# Patient Record
Sex: Male | Born: 1940 | Race: Black or African American | Hispanic: No | Marital: Single | Smoking: Former smoker
Health system: Southern US, Community
[De-identification: ages and names within clinical notes are randomized; demographics above are authoritative.]

## PROBLEM LIST (undated history)

## (undated) DIAGNOSIS — E785 Hyperlipidemia, unspecified: Secondary | ICD-10-CM

## (undated) DIAGNOSIS — G459 Transient cerebral ischemic attack, unspecified: Secondary | ICD-10-CM

## (undated) DIAGNOSIS — F0390 Unspecified dementia without behavioral disturbance: Secondary | ICD-10-CM

---

## 2008-12-05 ENCOUNTER — Ambulatory Visit: Payer: Self-pay | Admitting: Diagnostic Radiology

## 2008-12-05 ENCOUNTER — Emergency Department (HOSPITAL_BASED_OUTPATIENT_CLINIC_OR_DEPARTMENT_OTHER): Admission: EM | Admit: 2008-12-05 | Discharge: 2008-12-06 | Payer: Self-pay | Admitting: Emergency Medicine

## 2010-08-05 LAB — URINE CULTURE
Colony Count: NO GROWTH
Culture: NO GROWTH

## 2010-08-05 LAB — POCT CARDIAC MARKERS
CKMB, poc: <1 ng/mL — ABNORMAL LOW (ref 1.0–8.0)
Troponin i, poc: <0.05 ng/mL (ref 0.00–0.09)

## 2010-08-05 LAB — COMPREHENSIVE METABOLIC PANEL WITH GFR
ALT: 6 U/L (ref 0–53)
AST: 23 U/L (ref 0–37)
Albumin: 4.3 g/dL (ref 3.5–5.2)
Alkaline Phosphatase: 112 U/L (ref 39–117)
BUN: 19 mg/dL (ref 6–23)
CO2: 32 meq/L (ref 19–32)
Calcium: 10.1 mg/dL (ref 8.4–10.5)
Chloride: 100 meq/L (ref 96–112)
Creatinine, Ser: 1.3 mg/dL (ref 0.4–1.5)
GFR calc non Af Amer: 55 mL/min — ABNORMAL LOW
Glucose, Bld: 113 mg/dL — ABNORMAL HIGH (ref 70–99)
Potassium: 3.2 meq/L — ABNORMAL LOW (ref 3.5–5.1)
Sodium: 143 meq/L (ref 135–145)
Total Bilirubin: 0.3 mg/dL (ref 0.3–1.2)
Total Protein: 8.2 g/dL (ref 6.0–8.3)

## 2010-08-05 LAB — DIFFERENTIAL
Basophils Absolute: 0.2 K/uL — ABNORMAL HIGH (ref 0.0–0.1)
Basophils Relative: 2 % — ABNORMAL HIGH (ref 0–1)
Eosinophils Absolute: 0 K/uL (ref 0.0–0.7)
Eosinophils Relative: 1 % (ref 0–5)
Lymphocytes Relative: 21 % (ref 12–46)
Lymphs Abs: 1.7 K/uL (ref 0.7–4.0)
Monocytes Absolute: 0.6 K/uL (ref 0.1–1.0)
Monocytes Relative: 8 % (ref 3–12)
Neutro Abs: 5.7 K/uL (ref 1.7–7.7)
Neutrophils Relative %: 69 % (ref 43–77)

## 2010-08-05 LAB — URINALYSIS, ROUTINE W REFLEX MICROSCOPIC
Bilirubin Urine: NEGATIVE
Glucose, UA: NEGATIVE mg/dL
Hgb urine dipstick: NEGATIVE
Ketones, ur: NEGATIVE mg/dL
Nitrite: NEGATIVE
Protein, ur: NEGATIVE mg/dL
Specific Gravity, Urine: 1.015 (ref 1.005–1.030)
Urobilinogen, UA: 1 mg/dL (ref 0.0–1.0)
pH: 7.5 (ref 5.0–8.0)

## 2010-08-05 LAB — URINE MICROSCOPIC-ADD ON

## 2010-08-05 LAB — POCT TOXICOLOGY PANEL

## 2010-08-05 LAB — APTT: aPTT: 35 s (ref 24–37)

## 2010-08-05 LAB — CBC
HCT: 40.2 % (ref 39.0–52.0)
Platelets: 211 10*3/uL (ref 150–400)
WBC: 8.2 10*3/uL (ref 4.0–10.5)

## 2019-01-28 DIAGNOSIS — I10 Essential (primary) hypertension: Secondary | ICD-10-CM | POA: Insufficient documentation

## 2019-01-28 DIAGNOSIS — Z8673 Personal history of transient ischemic attack (TIA), and cerebral infarction without residual deficits: Secondary | ICD-10-CM | POA: Insufficient documentation

## 2019-01-28 DIAGNOSIS — W19XXXA Unspecified fall, initial encounter: Secondary | ICD-10-CM | POA: Insufficient documentation

## 2019-01-28 DIAGNOSIS — R29898 Other symptoms and signs involving the musculoskeletal system: Secondary | ICD-10-CM | POA: Insufficient documentation

## 2019-01-28 DIAGNOSIS — Z9114 Patient's other noncompliance with medication regimen: Secondary | ICD-10-CM | POA: Insufficient documentation

## 2019-03-28 DIAGNOSIS — G459 Transient cerebral ischemic attack, unspecified: Secondary | ICD-10-CM | POA: Insufficient documentation

## 2019-04-01 DIAGNOSIS — I639 Cerebral infarction, unspecified: Secondary | ICD-10-CM | POA: Insufficient documentation

## 2019-04-01 DIAGNOSIS — R262 Difficulty in walking, not elsewhere classified: Secondary | ICD-10-CM | POA: Insufficient documentation

## 2019-07-20 ENCOUNTER — Emergency Department (HOSPITAL_COMMUNITY): Payer: Medicare HMO

## 2019-07-20 ENCOUNTER — Encounter (HOSPITAL_COMMUNITY): Payer: Self-pay

## 2019-07-20 ENCOUNTER — Emergency Department (HOSPITAL_COMMUNITY)
Admission: EM | Admit: 2019-07-20 | Discharge: 2019-07-20 | Disposition: A | Discharge status: Home / Self Care | Payer: Medicare HMO | Attending: Emergency Medicine | Admitting: Emergency Medicine

## 2019-07-20 DIAGNOSIS — Y92129 Unspecified place in nursing home as the place of occurrence of the external cause: Secondary | ICD-10-CM | POA: Insufficient documentation

## 2019-07-20 DIAGNOSIS — Z7901 Long term (current) use of anticoagulants: Secondary | ICD-10-CM | POA: Diagnosis not present

## 2019-07-20 DIAGNOSIS — Y999 Unspecified external cause status: Secondary | ICD-10-CM | POA: Diagnosis not present

## 2019-07-20 DIAGNOSIS — R030 Elevated blood-pressure reading, without diagnosis of hypertension: Secondary | ICD-10-CM | POA: Insufficient documentation

## 2019-07-20 DIAGNOSIS — W06XXXA Fall from bed, initial encounter: Secondary | ICD-10-CM | POA: Diagnosis not present

## 2019-07-20 DIAGNOSIS — Z23 Encounter for immunization: Secondary | ICD-10-CM | POA: Insufficient documentation

## 2019-07-20 DIAGNOSIS — Y939 Activity, unspecified: Secondary | ICD-10-CM | POA: Insufficient documentation

## 2019-07-20 DIAGNOSIS — S0181XA Laceration without foreign body of other part of head, initial encounter: Secondary | ICD-10-CM | POA: Insufficient documentation

## 2019-07-20 DIAGNOSIS — F0390 Unspecified dementia without behavioral disturbance: Secondary | ICD-10-CM | POA: Insufficient documentation

## 2019-07-20 DIAGNOSIS — I10 Essential (primary) hypertension: Secondary | ICD-10-CM

## 2019-07-20 HISTORY — DX: Unspecified dementia, unspecified severity, without behavioral disturbance, psychotic disturbance, mood disturbance, and anxiety: F03.90

## 2019-07-20 HISTORY — DX: Hyperlipidemia, unspecified: E78.5

## 2019-07-20 HISTORY — DX: Transient cerebral ischemic attack, unspecified: G45.9

## 2019-07-20 MED ORDER — TETANUS-DIPHTH-ACELL PERTUSSIS 5-2.5-18.5 LF-MCG/0.5 IM SUSP
0.5000 mL | Freq: Once | INTRAMUSCULAR | Status: AC
  Administered 2019-07-20: 0.5 mL via INTRAMUSCULAR
  Filled 2019-07-20: qty 0.5

## 2019-07-20 NOTE — ED Triage Notes (Signed)
Pt comes via [facility] form [facility], got up to use the bathroom had a mechanical fall, hit head, no LOC, lac to center forehead, on Plavix

## 2019-07-20 NOTE — ED Provider Notes (Signed)
[facility] EMERGENCY DEPARTMENT Provider Note   CSN: [id number] Arrival date & time: 07/20/19  0111   History Chief Complaint  Patient presents with  . Fall    Jeff Wilson is a 79 y.o. male.  The history is provided by the patient.  Fall  He has history of hyperlipidemia, transient ischemic attack, dementia and is brought in by ambulance as a level 2 trauma because he fell out of bed.  He did strike his forehead suffering a laceration.  There was no known loss of consciousness.  He denies other injury.  It is not on his last tetanus immunization was.  He is on clopidogrel, but no systemic anticoagulants.  Past Medical History:  Diagnosis Date  . Dementia (HCC)   . Hyperlipemia   . TIA (transient ischemic attack)     There are no problems to display for this patient.   History reviewed. No pertinent surgical history.     No family history on file.  Social History   Tobacco Use  . Smoking status: Not on file  Substance Use Topics  . Alcohol use: Not on file  . Drug use: Not on file    Home Medications Prior to Admission medications   Not on File    Allergies    Patient has no known allergies.  Review of Systems   Review of Systems  All other systems reviewed and are negative.   Physical Exam Updated Vital Signs BP (!) 151/82   Pulse (!) 106   Temp 98.1 F (36.7 C)   Resp 13   Ht 5\' 9"  (1.753 m)   Wt 72.6 kg   SpO2 99%   BMI 23.63 kg/m   Physical Exam Vitals and nursing note reviewed.   79 year old male, resting comfortably and in no acute distress. Vital signs are significant for mildly elevated blood pressure and mildly elevated heart rate. Oxygen saturation is 99%, which is normal. Head is normocephalic.  Stellate laceration is present on the forehead. PERRLA, EOMI. Oropharynx is clear. Neck is nontender without adenopathy or JVD. Back is nontender and there is no CVA tenderness. Lungs are clear without rales, wheezes,  or rhonchi. Chest is nontender. Heart has regular rate and rhythm without murmur. Abdomen is soft, flat, nontender without masses or hepatosplenomegaly and peristalsis is normoactive. Extremities have no cyanosis or edema, full range of motion is present. Skin is warm and dry without rash. Neurologic: Awake and alert and oriented to person and place but not time, cranial nerves are intact, there are no motor or sensory deficits.  ED Results / Procedures / Treatments    EKG EKG Interpretation  Date/Time:  Tuesday July 20 2019 01:15:40 EDT Ventricular Rate:  76 PR Interval:    QRS Duration: 100 QT Interval:  396 QTC Calculation: 446 R Axis:   6 Text Interpretation: Sinus rhythm Ventricular premature complex Anteroseptal infarct, old When compared with ECG of 12/05/2008, Premature ventricular complexes are now present Confirmed by 02/04/2009 (Dione Booze) on 07/20/2019 1:24:32 AM   Radiology CT Head Wo Contrast  Result Date: 07/20/2019 CLINICAL DATA:  Fall, hit head EXAM: CT HEAD WITHOUT CONTRAST TECHNIQUE: Contiguous axial images were obtained from the base of the skull through the vertex without intravenous contrast. 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 Cerebral atrophy. 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 No acute intracranial abnormality. Specifically, no hemorrhage, hydrocephalus, mass lesion, acute infarction, or significant  intracranial injury. Vascular: No hyperdense vessel or unexpected calcification. Skull: No acute calvarial abnormality. Sinuses/Orbits: Visualized paranasal sinuses and mastoids clear. Orbital soft tissues unremarkable. Other: Soft tissue swelling and gas in the forehead soft tissues. IMPRESSION: Atrophy, chronic microvascular disease. No acute intracranial abnormality. 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 Electronically Signed   By: Rolm Baptise M.D.    On: 07/20/2019 01:47   CT Cervical Spine Wo Contrast  Result Date: 07/20/2019 CLINICAL DATA:  Fall, hit head. EXAM: CT CERVICAL SPINE WITHOUT CONTRAST TECHNIQUE: Multidetector CT imaging of the cervical spine was performed without intravenous contrast. Multiplanar CT image reconstructions were also generated. COMPARISON:  None. FINDINGS: Alignment: No subluxation Skull base and vertebrae: No acute fracture. No primary bone lesion or focal pathologic process. Soft tissues and spinal canal: No prevertebral fluid or swelling. No visible canal hematoma. Disc levels: Diffuse degenerative disc and facet disease. Multilevel bilateral neural foraminal narrowing. Upper chest: No acute findings Other: Carotid artery calcifications IMPRESSION: Degenerative disc and facet disease.  No acute bony abnormality. Electronically Signed   By: Rolm Baptise M.D.   On: 07/20/2019 01:49    Procedures .Marland KitchenLaceration Repair  Date/Time: 07/20/2019 1:29 AM Performed by: Delora Fuel, MD Authorized by: Delora Fuel, MD   Consent:    Consent obtained:  Verbal   Consent given by:  Patient   Risks discussed:  Infection and poor cosmetic result   Alternatives discussed:  No treatment Anesthesia (see MAR for exact dosages):    Anesthesia method:  None Laceration details:    Location:  Face   Face location:  Forehead   Length (cm):  3   Depth (mm):  1 Repair type:    Repair type:  Simple Pre-procedure details:    Preparation:  Patient was prepped and draped in usual sterile fashion and imaging obtained to evaluate for foreign bodies Exploration:    Hemostasis achieved with:  Direct pressure   Wound exploration: entire depth of wound probed and visualized     Wound extent: no foreign bodies/material noted     Contaminated: no   Treatment:    Area cleansed with:  Saline   Amount of cleaning:  Standard Skin repair:    Repair method:  Tissue adhesive Approximation:    Approximation:  Close Post-procedure details:      Dressing:  Open (no dressing)     Medications Ordered in ED Medications  Tdap (BOOSTRIX) injection 0.5 mL (0.5 mLs Intramuscular Given 07/20/19 0123)    ED Course  I have reviewed the triage vital signs and the nursing notes.  Pertinent imaging results that were available during my care of the patient were reviewed by me and considered in my medical decision making (see chart for details).  MDM Rules/Calculators/A&P Fall from bed with forehead laceration.  Laceration is closed with tissue adhesive.  He is sent for CT of head and cervical spine.  Old records are reviewed, and it is noted that he had been evaluated at [facility] because of ambulatory dysfunction.  Tdap booster is given.  CT scans show no acute injury.  He is discharged home.  Final Clinical Impression(s) / ED Diagnoses Final diagnoses:  Fall from bed, initial encounter  Forehead laceration, initial encounter  Elevated blood pressure reading with diagnosis of hypertension    Rx / DC Orders ED Discharge Orders    None       Delora Fuel, MD 62/69/48 [phone]

## 2019-07-20 NOTE — ED Notes (Signed)
[facility] called to transport pt 

## 2020-05-22 ENCOUNTER — Emergency Department (HOSPITAL_BASED_OUTPATIENT_CLINIC_OR_DEPARTMENT_OTHER): Payer: Medicare HMO

## 2020-05-22 ENCOUNTER — Other Ambulatory Visit: Payer: Self-pay

## 2020-05-22 ENCOUNTER — Emergency Department (HOSPITAL_BASED_OUTPATIENT_CLINIC_OR_DEPARTMENT_OTHER)
Admission: EM | Admit: 2020-05-22 | Discharge: 2020-05-22 | Disposition: A | Discharge status: Home / Self Care | Payer: Medicare HMO | Attending: Emergency Medicine | Admitting: Emergency Medicine

## 2020-05-22 ENCOUNTER — Encounter (HOSPITAL_BASED_OUTPATIENT_CLINIC_OR_DEPARTMENT_OTHER): Payer: Self-pay | Admitting: Emergency Medicine

## 2020-05-22 DIAGNOSIS — Z8673 Personal history of transient ischemic attack (TIA), and cerebral infarction without residual deficits: Secondary | ICD-10-CM | POA: Insufficient documentation

## 2020-05-22 DIAGNOSIS — Z87891 Personal history of nicotine dependence: Secondary | ICD-10-CM | POA: Insufficient documentation

## 2020-05-22 DIAGNOSIS — Z7902 Long term (current) use of antithrombotics/antiplatelets: Secondary | ICD-10-CM | POA: Insufficient documentation

## 2020-05-22 DIAGNOSIS — F0390 Unspecified dementia without behavioral disturbance: Secondary | ICD-10-CM | POA: Insufficient documentation

## 2020-05-22 DIAGNOSIS — Z20822 Contact with and (suspected) exposure to covid-19: Secondary | ICD-10-CM | POA: Insufficient documentation

## 2020-05-22 DIAGNOSIS — R4182 Altered mental status, unspecified: Secondary | ICD-10-CM | POA: Diagnosis present

## 2020-05-22 LAB — COMPREHENSIVE METABOLIC PANEL
ALT: 23 U/L (ref 0–44)
AST: 28 U/L (ref 15–41)
Albumin: 3.5 g/dL (ref 3.5–5.0)
Alkaline Phosphatase: 89 U/L (ref 38–126)
Anion gap: 9 (ref 5–15)
BUN: 17 mg/dL (ref 8–23)
CO2: 28 mmol/L (ref 22–32)
Calcium: 9.3 mg/dL (ref 8.9–10.3)
Chloride: 103 mmol/L (ref 98–111)
Creatinine, Ser: 0.95 mg/dL (ref 0.61–1.24)
GFR, Estimated: >60 mL/min (ref >60)
Glucose, Bld: 113 mg/dL — ABNORMAL HIGH (ref 70–99)
Potassium: 3.5 mmol/L (ref 3.5–5.1)
Sodium: 140 mmol/L (ref 135–145)
Total Bilirubin: 0.6 mg/dL (ref 0.3–1.2)
Total Protein: 7.2 g/dL (ref 6.5–8.1)

## 2020-05-22 LAB — URINALYSIS, ROUTINE W REFLEX MICROSCOPIC
Bilirubin Urine: NEGATIVE
Glucose, UA: NEGATIVE mg/dL
Hgb urine dipstick: NEGATIVE
Ketones, ur: NEGATIVE mg/dL
Leukocytes,Ua: NEGATIVE
Nitrite: NEGATIVE
Protein, ur: NEGATIVE mg/dL
Specific Gravity, Urine: 1.01 (ref 1.005–1.030)
pH: 6.5 (ref 5.0–8.0)

## 2020-05-22 LAB — CBC WITH DIFFERENTIAL/PLATELET
Abs Immature Granulocytes: 0.01 10*3/uL (ref 0.00–0.07)
Basophils Absolute: 0 10*3/uL (ref 0.0–0.1)
Basophils Relative: 1 %
Eosinophils Absolute: 0.1 10*3/uL (ref 0.0–0.5)
Eosinophils Relative: 2 %
HCT: 37.4 % — ABNORMAL LOW (ref 39.0–52.0)
Hemoglobin: 12 g/dL — ABNORMAL LOW (ref 13.0–17.0)
Immature Granulocytes: 0 %
Lymphocytes Relative: 20 %
Lymphs Abs: 1.3 10*3/uL (ref 0.7–4.0)
MCH: 28.2 pg (ref 26.0–34.0)
MCHC: 32.1 g/dL (ref 30.0–36.0)
MCV: 88 fL (ref 80.0–100.0)
Monocytes Absolute: 0.6 10*3/uL (ref 0.1–1.0)
Monocytes Relative: 9 %
Neutro Abs: 4.6 10*3/uL (ref 1.7–7.7)
Neutrophils Relative %: 68 %
Platelets: 253 10*3/uL (ref 150–400)
RBC: 4.25 MIL/uL (ref 4.22–5.81)
RDW: 15.9 % — ABNORMAL HIGH (ref 11.5–15.5)
WBC: 6.6 10*3/uL (ref 4.0–10.5)
nRBC: 0 % (ref 0.0–0.2)

## 2020-05-22 LAB — SARS CORONAVIRUS 2 BY RT PCR (HOSPITAL ORDER, PERFORMED IN ~~LOC~~ HOSPITAL LAB): SARS Coronavirus 2: NEGATIVE

## 2020-05-22 MED ORDER — SODIUM CHLORIDE 0.9 % IV BOLUS
500.0000 mL | Freq: Once | INTRAVENOUS | Status: AC
  Administered 2020-05-22: 500 mL via INTRAVENOUS

## 2020-05-22 NOTE — ED Provider Notes (Signed)
I received this patient in signout from Dr. Lockie Mola. He had presented with quieter than normal but more talkative here. W/u reassuring thus far, at signout we were awaiting UA prior to d/c back to facility.  UA normal. Pt alert, eating dinner, and conversant on reassessment. Discharged back to facility with reassuring VS and in stable condition.   Sakara Lehtinen, Ambrose Finland, MD 05/22/20 [phone]

## 2020-05-22 NOTE — Discharge Instructions (Addendum)
Jeff Wilson lab work was reassuring today with no signs of infection or dehydration.  Continue to monitor his symptoms and return to the ER if he has any lethargy, vomiting, abdominal pain, fever, or sudden weakness.

## 2020-05-22 NOTE — ED Provider Notes (Signed)
[facility] EMERGENCY DEPARTMENT Provider Note   CSN: [id number] Arrival date & time: 05/22/20  1115     History Chief Complaint  Patient presents with  . Altered Mental Status    Jeff Wilson is a 80 y.o. male.  History is limited due to dementia history.  The history is provided by the patient.  Mental Health Problem Presenting symptoms comment:  Less talkactive today per facility. Dementia. Patient states name and no compliants  Degree of incapacity (severity):  Mild Onset quality:  Gradual Timing:  Intermittent Progression:  Waxing and waning Chronicity:  New Relieved by:  Nothing Worsened by:  Nothing      Past Medical History:  Diagnosis Date  . Dementia (HCC)   . Hyperlipemia   . TIA (transient ischemic attack)     There are no problems to display for this patient.   History reviewed. No pertinent surgical history.     No family history on file.  Social History   Tobacco Use  . Smoking status: Former Games developer  . Smokeless tobacco: Never Used    Home Medications Prior to Admission medications   Medication Sig Start Date End Date Taking? Authorizing Provider  amLODipine (NORVASC) 10 MG tablet Take 10 mg by mouth daily.    [provider]  atorvastatin (LIPITOR) 80 MG tablet Take 80 mg by mouth daily at 6 PM.    [provider]  clopidogrel (PLAVIX) 75 MG tablet Take 75 mg by mouth daily.    [provider]  lisinopril (ZESTRIL) 40 MG tablet Take 40 mg by mouth daily.    [provider]  vitamin B-12 (CYANOCOBALAMIN) 250 MCG tablet Take 250 mcg by mouth daily.    [provider]  Vitamin D, Ergocalciferol, (DRISDOL) 1.25 MG (50000 UNIT) CAPS capsule Take 50,000 Units by mouth every Monday.    [provider]    Allergies    Patient has no known allergies.  Review of Systems   Review of Systems  Unable to perform ROS: Dementia    Physical Exam Updated Vital Signs BP 125/77 (BP  Location: Right Arm)   Pulse 74   Temp 98 F (36.7 C) (Oral)   Resp 14   SpO2 100%   Physical Exam Vitals and nursing note reviewed.  Constitutional:      General: He is not in acute distress.    Appearance: He is well-developed and well-nourished. He is not ill-appearing.  HENT:     Head: Normocephalic and atraumatic.  Eyes:     Extraocular Movements: Extraocular movements intact.     Conjunctiva/sclera: Conjunctivae normal.     Pupils: Pupils are equal, round, and reactive to light.  Cardiovascular:     Rate and Rhythm: Normal rate and regular rhythm.     Pulses: Normal pulses.     Heart sounds: Normal heart sounds. No murmur heard.   Pulmonary:     Effort: Pulmonary effort is normal. No respiratory distress.     Breath sounds: Normal breath sounds.  Abdominal:     Palpations: Abdomen is soft.     Tenderness: There is no abdominal tenderness.  Musculoskeletal:        General: No edema.     Cervical back: Normal range of motion and neck supple.  Skin:    General: Skin is warm and dry.     Capillary Refill: Capillary refill takes less than 2 seconds.  Neurological:     General: No focal deficit present.  Mental Status: He is alert.     Comments: Moves all extremities  Psychiatric:        Mood and Affect: Mood and affect normal.     ED Results / Procedures / Treatments   Labs (all labs ordered are listed, but only abnormal results are displayed) Labs Reviewed  CBC WITH DIFFERENTIAL/PLATELET - Abnormal; Notable for the following components:      Result Value   Hemoglobin 12.0 (*)    HCT 37.4 (*)    RDW 15.9 (*)    All other components within normal limits  COMPREHENSIVE METABOLIC PANEL - Abnormal; Notable for the following components:   Glucose, Bld 113 (*)    All other components within normal limits  SARS CORONAVIRUS 2 BY RT PCR (HOSPITAL ORDER, PERFORMED IN [facility])  URINE CULTURE  URINALYSIS, ROUTINE W REFLEX MICROSCOPIC    EKG EKG  Interpretation  Date/Time:  Monday May 22 2020 11:28:46 EST Ventricular Rate:  76 PR Interval:  206 QRS Duration: 90 QT Interval:  358 QTC Calculation: 402 R Axis:   -5 Text Interpretation: Normal sinus rhythm Normal ECG Confirmed by Virgina Norfolk [phone]) on 05/22/2020 12:34:27 PM   Radiology CT Head Wo Contrast  Result Date: 05/22/2020 CLINICAL DATA:  Mental status change, unknown cause EXAM: CT HEAD WITHOUT CONTRAST TECHNIQUE: Contiguous axial images were obtained from the base of the skull through the vertex without intravenous contrast. COMPARISON:  Head CT July 20, 2019 and brain MRI March 29, 2019 FINDINGS: Brain: Severe sequela of chronic small vessel disease throughout the periventricular and subcortical white matter, similar prior. Remote bilateral basal ganglia, thalamic, and left pons lacunar infarcts. Mild diffuse cerebral atrophy, unchanged. No acute intracranial abnormality. Specifically, no hemorrhage, hydrocephalus, mass lesion or acute infarction. Vascular: No hyperdense vessel or unexpected calcification. Skull: Normal. Negative for fracture or focal lesion. Sinuses/Orbits: No acute finding. Other: None. IMPRESSION: 1. No acute intracranial findings. 2. Similar severe sequela of chronic small vessel disease as well as remote bilateral basal ganglia, thalamic, and left pons lacunar infarcts. Electronically Signed   By: Maudry Mayhew MD   On: 05/22/2020 11:51   DG Chest Port 1 View  Result Date: 05/22/2020 CLINICAL DATA:  Cough EXAM: PORTABLE CHEST 1 VIEW COMPARISON:  01/27/2019 FINDINGS: Normal heart size and mediastinal contours. No acute infiltrate or edema. No effusion or pneumothorax. No acute osseous findings. IMPRESSION: No active disease. Electronically Signed   By: Marnee Spring M.D.   On: 05/22/2020 11:52    Procedures Procedures   Medications Ordered in ED Medications  sodium chloride 0.9 % bolus 500 mL (500 mLs Intravenous New Bag/Given 05/22/20 1341)     ED Course  I have reviewed the triage vital signs and the nursing notes.  Pertinent labs & imaging results that were available during my care of the patient were reviewed by me and considered in my medical decision making (see chart for details).    MDM Rules/Calculators/A&P                          Jeff Wilson is a 80 year old male with history of dementia who presents the ED with altered mental status.  Normal vitals.  No fever.  Patient can tell me his name and denies any pain.  Overall he appears pleasantly demented.  I guess he was less verbal today at his nursing facility.  He appears neurologically intact.  He had no significant anemia, electrolyte abnormality,  kidney injury.  Covid test is negative.  No pneumonia, pneumothorax, pleural effusion on chest x-ray.  CT scan of the head is unremarkable.  Will obtain urinalysis but overall suspect that patient is at baseline.  Anticipate discharge back to facility.  Patient signed out to oncoming ED staff with patient pending urinalysis.  This chart was dictated using voice recognition software.  Despite best efforts to proofread,  errors can occur which can change the documentation meaning.    Final Clinical Impression(s) / ED Diagnoses Final diagnoses:  Altered mental status    Rx / DC Orders ED Discharge Orders    None       Virgina Norfolk, DO 05/22/20 1420

## 2020-05-22 NOTE — ED Triage Notes (Signed)
Per staff at [facility] rehab , pt was not talking to them this am and that was abnormal for him , per ems pt did ask for them for a drink and a blanket . Absolutly NOTHING else wrong with him

## 2020-05-22 NOTE — ED Notes (Signed)
Arnold Long EMT gave Pt. Meal and has pt. Sitting up in bed eating.  Pt. In no distress and is Alert eating.

## 2020-05-23 LAB — URINE CULTURE

## 2020-06-12 ENCOUNTER — Ambulatory Visit: Payer: Medicare HMO | Admitting: Podiatry

## 2020-06-19 ENCOUNTER — Other Ambulatory Visit: Payer: Self-pay

## 2020-06-19 ENCOUNTER — Ambulatory Visit (INDEPENDENT_AMBULATORY_CARE_PROVIDER_SITE_OTHER): Payer: Medicare HMO | Admitting: Podiatry

## 2020-06-19 DIAGNOSIS — L600 Ingrowing nail: Secondary | ICD-10-CM | POA: Diagnosis not present

## 2020-06-19 DIAGNOSIS — Z789 Other specified health status: Secondary | ICD-10-CM | POA: Insufficient documentation

## 2020-06-19 DIAGNOSIS — S90221A Contusion of right lesser toe(s) with damage to nail, initial encounter: Secondary | ICD-10-CM

## 2020-06-19 DIAGNOSIS — Z7409 Other reduced mobility: Secondary | ICD-10-CM | POA: Insufficient documentation

## 2020-06-19 MED ORDER — DOXYCYCLINE HYCLATE 100 MG PO TABS: 100.0000 mg | ORAL_TABLET | Freq: Two times a day (BID) | ORAL | 0 refills | Status: DC

## 2020-06-19 MED ORDER — GENTAMICIN SULFATE 0.1 % EX CREA: 1.0000 "application " | TOPICAL_CREAM | Freq: Two times a day (BID) | CUTANEOUS | 1 refills | Status: DC

## 2020-06-19 NOTE — Progress Notes (Signed)
   Subjective: Patient presents today for evaluation of pain to the right second toe as well as the medial border of the left great toe. Patient has noticed some discoloration to the right second toe nail plate. Both toes are very sensitive to light touch. There is been ongoing for a few weeks now. He presents for further treatment and evaluation  Past Medical History:  Diagnosis Date  . Dementia (HCC)   . Hyperlipemia   . TIA (transient ischemic attack)     Objective:  General: Well developed, nourished, in no acute distress, alert and oriented x3   Dermatology: Skin is warm, dry and supple bilateral. Medial border of the left second toe appears to be erythematous with evidence of an ingrowing nail. Pain on palpation noted to the border of the nail fold. There is some purulence noted.  The nail plate to the right second toe does have an underlying subungual hematoma that is dry and stable. No drainage noted. No malodor noted. The nail plate is well adhered. Very stable.  Vascular: Dorsalis Pedis artery and Posterior Tibial artery pedal pulses palpable. No lower extremity edema noted.   Neruologic: Grossly intact via light touch bilateral.  Musculoskeletal: Muscular strength within normal limits in all groups bilateral. Normal range of motion noted to all pedal and ankle joints.   Assesement: #1 Paronychia with ingrowing nail medial border left great toe #2 Dry stable subungual hematoma right second toe  Plan of Care:  1. Patient evaluated.  2. Discussed treatment alternatives and plan of care. The patient declined having an injection to numb his toe to the ingrown toenail portion. We opted for conservative management. The offending ingrown toenail was debrided as far back as possible using a nail nipper. Antibiotic ointment and Band-Aid was applied 3. Prescription for doxycycline 100 mg 2 times daily #20 4. Prescription for gentamicin cream applied daily 5. In regards to the right  second toenail. It is very dry and stable. Recommend simple observation. The nail should fall off and he now should grow in. 6. Return to clinic in 2 weeks  Felecia Shelling, DPM [facility]  Dr. Felecia Shelling, DPM    [address] [place], [place] [address]                Office [phone]  Fax [phone]

## 2020-07-03 ENCOUNTER — Ambulatory Visit: Payer: Medicare HMO | Admitting: Podiatry

## 2021-12-10 ENCOUNTER — Other Ambulatory Visit: Payer: Self-pay

## 2021-12-10 ENCOUNTER — Inpatient Hospital Stay (HOSPITAL_COMMUNITY)
Admission: EM | Admit: 2021-12-10 | Discharge: 2021-12-17 | DRG: 389 | Disposition: A | Discharge status: Skilled Nursing Facility | Payer: Medicare HMO | Attending: Family Medicine | Admitting: Family Medicine

## 2021-12-10 ENCOUNTER — Emergency Department (HOSPITAL_COMMUNITY): Payer: Medicare HMO

## 2021-12-10 DIAGNOSIS — E876 Hypokalemia: Secondary | ICD-10-CM | POA: Diagnosis present

## 2021-12-10 DIAGNOSIS — Z79899 Other long term (current) drug therapy: Secondary | ICD-10-CM

## 2021-12-10 DIAGNOSIS — K5641 Fecal impaction: Secondary | ICD-10-CM | POA: Diagnosis present

## 2021-12-10 DIAGNOSIS — F0390 Unspecified dementia without behavioral disturbance: Secondary | ICD-10-CM | POA: Diagnosis present

## 2021-12-10 DIAGNOSIS — K5289 Other specified noninfective gastroenteritis and colitis: Secondary | ICD-10-CM | POA: Diagnosis present

## 2021-12-10 DIAGNOSIS — Z87891 Personal history of nicotine dependence: Secondary | ICD-10-CM

## 2021-12-10 DIAGNOSIS — R6510 Systemic inflammatory response syndrome (SIRS) of non-infectious origin without acute organ dysfunction: Secondary | ICD-10-CM

## 2021-12-10 DIAGNOSIS — I10 Essential (primary) hypertension: Secondary | ICD-10-CM | POA: Diagnosis present

## 2021-12-10 DIAGNOSIS — E869 Volume depletion, unspecified: Secondary | ICD-10-CM | POA: Diagnosis present

## 2021-12-10 DIAGNOSIS — K5900 Constipation, unspecified: Secondary | ICD-10-CM | POA: Diagnosis present

## 2021-12-10 DIAGNOSIS — I442 Atrioventricular block, complete: Secondary | ICD-10-CM | POA: Diagnosis present

## 2021-12-10 DIAGNOSIS — R7881 Bacteremia: Secondary | ICD-10-CM

## 2021-12-10 DIAGNOSIS — Z8673 Personal history of transient ischemic attack (TIA), and cerebral infarction without residual deficits: Secondary | ICD-10-CM

## 2021-12-10 DIAGNOSIS — E872 Acidosis, unspecified: Secondary | ICD-10-CM | POA: Diagnosis present

## 2021-12-10 DIAGNOSIS — R4189 Other symptoms and signs involving cognitive functions and awareness: Secondary | ICD-10-CM | POA: Diagnosis present

## 2021-12-10 DIAGNOSIS — E782 Mixed hyperlipidemia: Secondary | ICD-10-CM | POA: Diagnosis present

## 2021-12-10 DIAGNOSIS — I700 Atherosclerosis of aorta: Secondary | ICD-10-CM | POA: Diagnosis present

## 2021-12-10 DIAGNOSIS — K567 Ileus, unspecified: Secondary | ICD-10-CM | POA: Diagnosis not present

## 2021-12-10 DIAGNOSIS — Z7902 Long term (current) use of antithrombotics/antiplatelets: Secondary | ICD-10-CM

## 2021-12-10 DIAGNOSIS — B957 Other staphylococcus as the cause of diseases classified elsewhere: Secondary | ICD-10-CM | POA: Diagnosis present

## 2021-12-10 DIAGNOSIS — I679 Cerebrovascular disease, unspecified: Secondary | ICD-10-CM | POA: Diagnosis present

## 2021-12-10 DIAGNOSIS — R1084 Generalized abdominal pain: Principal | ICD-10-CM

## 2021-12-10 LAB — COMPREHENSIVE METABOLIC PANEL
ALT: 17 U/L (ref 0–44)
AST: 24 U/L (ref 15–41)
Albumin: 3.7 g/dL (ref 3.5–5.0)
Alkaline Phosphatase: 89 U/L (ref 38–126)
Anion gap: 10 (ref 5–15)
BUN: 20 mg/dL (ref 8–23)
CO2: 23 mmol/L (ref 22–32)
Calcium: 9.5 mg/dL (ref 8.9–10.3)
Chloride: 105 mmol/L (ref 98–111)
Creatinine, Ser: 0.92 mg/dL (ref 0.61–1.24)
GFR, Estimated: >60 mL/min (ref >60)
Glucose, Bld: 143 mg/dL — ABNORMAL HIGH (ref 70–99)
Potassium: 3 mmol/L — ABNORMAL LOW (ref 3.5–5.1)
Sodium: 138 mmol/L (ref 135–145)
Total Bilirubin: 0.8 mg/dL (ref 0.3–1.2)
Total Protein: 7.7 g/dL (ref 6.5–8.1)

## 2021-12-10 LAB — LACTIC ACID, PLASMA
Lactic Acid, Venous: 3 mmol/L (ref 0.5–1.9)
Lactic Acid, Venous: 2.4 mmol/L (ref 0.5–1.9)

## 2021-12-10 LAB — URINALYSIS, ROUTINE W REFLEX MICROSCOPIC
Bilirubin Urine: NEGATIVE
Glucose, UA: NEGATIVE mg/dL
Hgb urine dipstick: NEGATIVE
Ketones, ur: NEGATIVE mg/dL
Leukocytes,Ua: NEGATIVE
Nitrite: NEGATIVE
Protein, ur: NEGATIVE mg/dL
Specific Gravity, Urine: 1.03 (ref 1.005–1.030)
pH: 5 (ref 5.0–8.0)

## 2021-12-10 LAB — CBC WITH DIFFERENTIAL/PLATELET
Abs Immature Granulocytes: 0.09 10*3/uL — ABNORMAL HIGH (ref 0.00–0.07)
Basophils Absolute: 0 10*3/uL (ref 0.0–0.1)
Basophils Relative: 0 %
Eosinophils Absolute: 0 10*3/uL (ref 0.0–0.5)
Eosinophils Relative: 0 %
HCT: 41.1 % (ref 39.0–52.0)
Hemoglobin: 13.3 g/dL (ref 13.0–17.0)
Immature Granulocytes: 1 %
Lymphocytes Relative: 5 %
Lymphs Abs: 0.8 10*3/uL (ref 0.7–4.0)
MCH: 27.5 pg (ref 26.0–34.0)
MCHC: 32.4 g/dL (ref 30.0–36.0)
MCV: 85.1 fL (ref 80.0–100.0)
Monocytes Absolute: 1 10*3/uL (ref 0.1–1.0)
Monocytes Relative: 6 %
Neutro Abs: 14.2 10*3/uL — ABNORMAL HIGH (ref 1.7–7.7)
Neutrophils Relative %: 88 %
Platelets: 215 10*3/uL (ref 150–400)
RBC: 4.83 MIL/uL (ref 4.22–5.81)
RDW: 14.8 % (ref 11.5–15.5)
WBC: 16.2 10*3/uL — ABNORMAL HIGH (ref 4.0–10.5)
nRBC: 0 % (ref 0.0–0.2)

## 2021-12-10 MED ORDER — POTASSIUM CHLORIDE 10 MEQ/100ML IV SOLN
10.0000 meq | INTRAVENOUS | Status: AC
  Administered 2021-12-10 – 2021-12-11 (×4): 10 meq via INTRAVENOUS
  Filled 2021-12-10 (×4): qty 100

## 2021-12-10 MED ORDER — SODIUM CHLORIDE 0.9 % IV BOLUS
1000.0000 mL | Freq: Once | INTRAVENOUS | Status: AC
  Administered 2021-12-10: 1000 mL via INTRAVENOUS

## 2021-12-10 MED ORDER — IOHEXOL 300 MG/ML  SOLN
100.0000 mL | Freq: Once | INTRAMUSCULAR | Status: AC | PRN
  Administered 2021-12-10: 100 mL via INTRAVENOUS

## 2021-12-10 MED ORDER — SODIUM CHLORIDE 0.9 % IV BOLUS
500.0000 mL | Freq: Once | INTRAVENOUS | Status: AC
  Administered 2021-12-10: 500 mL via INTRAVENOUS

## 2021-12-10 MED ORDER — POTASSIUM CHLORIDE CRYS ER 20 MEQ PO TBCR
40.0000 meq | EXTENDED_RELEASE_TABLET | Freq: Once | ORAL | Status: AC
  Administered 2021-12-10: 40 meq via ORAL
  Filled 2021-12-10: qty 2

## 2021-12-10 MED ORDER — MORPHINE SULFATE (PF) 4 MG/ML IV SOLN
4.0000 mg | Freq: Once | INTRAVENOUS | Status: AC
  Administered 2021-12-10: 4 mg via INTRAVENOUS
  Filled 2021-12-10: qty 1

## 2021-12-10 MED ORDER — FLEET ENEMA 7-19 GM/118ML RE ENEM
1.0000 | ENEMA | Freq: Once | RECTAL | Status: AC
  Administered 2021-12-10: 1 via RECTAL
  Filled 2021-12-10: qty 1

## 2021-12-10 MED ORDER — PIPERACILLIN-TAZOBACTAM 3.375 G IVPB 30 MIN
3.3750 g | Freq: Once | INTRAVENOUS | Status: AC
Start: 2021-12-10 — End: 2021-12-10
  Administered 2021-12-10: 3.375 g via INTRAVENOUS
  Filled 2021-12-10: qty 50

## 2021-12-10 NOTE — ED Provider Notes (Signed)
[facility]-EMERGENCY DEPT Provider Note   CSN: [id number] Arrival date & time: 12/10/21  1925     History {Add pertinent medical, surgical, social history, OB history to HPI:1} No chief complaint on file.   Jeff Wilson is a 81 y.o. male.  Level 5 caveat secondary to dementia.  Patient brought in by ambulance from his facility for evaluation of abdominal pain.  He states that started yesterday although cannot describe anything more than that.  He said he has baseline constipation.  No nausea or vomiting no chest pain or shortness of breath.  No known fevers.  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  No other history available at this time.  On review of medical history it looks like he has history of strokes and is on Plavix.  The history is provided by the patient and the EMS personnel.  Abdominal Pain Pain location:  Generalized Pain severity:  Moderate Onset quality:  Gradual Duration:  2 days Timing:  Constant Progression:  Unchanged Chronicity:  New Relieved by:  None tried Worsened by:  Palpation and position changes Ineffective treatments:  None tried Associated symptoms: constipation   Associated symptoms: no chest pain, no diarrhea, no fever, no nausea, no shortness of breath and no vomiting        Home Medications Prior to Admission medications   Medication Sig Start Date End Date Taking? Authorizing Provider  amLODipine (NORVASC) 10 MG tablet Take 10 mg by mouth daily.    [provider]  atorvastatin (LIPITOR) 80 MG tablet Take 80 mg by mouth daily at 6 PM.    [provider]  cefTRIAXone (ROCEPHIN) 1 g injection SMARTSIG:1 Gram(s) IM Every Night 02/01/20   [provider]  clopidogrel (PLAVIX) 75 MG tablet Take 75 mg by mouth daily.    [provider]  doxycycline (VIBRA-TABS) 100 MG tablet Take 1 tablet (100 mg total) by mouth 2 (two) times daily. 06/19/20   Felecia Shelling, DPM  gentamicin cream  (GARAMYCIN) 0.1 % Apply 1 application topically 2 (two) times daily. 06/19/20   Felecia Shelling, DPM  hydrALAZINE (APRESOLINE) 25 MG tablet Take 25 mg by mouth 3 (three) times daily. 05/30/20   [provider]  ketoconazole (NIZORAL) 2 % cream SMARTSIG:1 Application Topical 1 to 2 Times Daily 03/13/20   [provider]  lisinopril (ZESTRIL) 40 MG tablet Take 40 mg by mouth daily.    [provider]  vitamin B-12 (CYANOCOBALAMIN) 250 MCG tablet Take 250 mcg by mouth daily.    [provider]  Vitamin D, Ergocalciferol, (DRISDOL) 1.25 MG (50000 UNIT) CAPS capsule Take 50,000 Units by mouth every Monday.    [provider]      Allergies    Patient has no known allergies.    Review of Systems   Review of Systems  Unable to perform ROS: Dementia  Constitutional:  Negative for fever.  Respiratory:  Negative for shortness of breath.   Cardiovascular:  Negative for chest pain.  Gastrointestinal:  Positive for abdominal pain and constipation. Negative for diarrhea, nausea and vomiting.    Physical Exam Updated Vital Signs BP (!) 149/93   Pulse 98   Temp 98.6 F (37 C) (Oral)   Ht 5\' 9"  (1.753 m)   Wt 91.6 kg   SpO2 99%   BMI 29.83 kg/m  Physical Exam Vitals and nursing note reviewed.  Constitutional:      General: He is not in acute distress.  Appearance: Normal appearance. He is well-developed.  HENT:     Head: Normocephalic and atraumatic.  Eyes:     Conjunctiva/sclera: Conjunctivae normal.  Cardiovascular:     Rate and Rhythm: Regular rhythm. Tachycardia present.     Heart sounds: No murmur heard. Pulmonary:     Effort: Pulmonary effort is normal. No respiratory distress.     Breath sounds: Normal breath sounds.  Abdominal:     General: There is distension.     Palpations: Abdomen is soft.     Tenderness: There is abdominal tenderness. There is no guarding or rebound.  Genitourinary:    Penis: Normal.      Testes: Normal.   Musculoskeletal:     Cervical back: Neck supple.     Right lower leg: No edema.     Left lower leg: No edema.  Skin:    General: Skin is warm and dry.     Capillary Refill: Capillary refill takes less than 2 seconds.  Neurological:     General: No focal deficit present.     Mental Status: He is alert. Mental status is at baseline. He is disoriented.     ED Results / Procedures / Treatments   Labs (all labs ordered are listed, but only abnormal results are displayed) Labs Reviewed - No data to display  EKG None  Radiology No results found.  Procedures Procedures  {Document cardiac monitor, telemetry assessment procedure when appropriate:1}  Medications Ordered in ED Medications  sodium chloride 0.9 % bolus 1,000 mL (has no administration in time range)  morphine (PF) 4 MG/ML injection 4 mg (has no administration in time range)    ED Course/ Medical Decision Making/ A&P                           Medical Decision Making Amount and/or Complexity of Data Reviewed Labs: ordered. Radiology: ordered.  Risk Prescription drug management.   This patient complains of ***; this involves an extensive number of treatment Options and is a complaint that carries with it a high risk of complications and morbidity. The differential includes ***  I ordered, reviewed and interpreted labs, which included *** I ordered medication *** and reviewed PMP when indicated. I ordered imaging studies which included *** and I independently    visualized and interpreted imaging which showed *** Additional history obtained from *** Previous records obtained and reviewed *** I consulted *** and discussed lab and imaging findings and discussed disposition.  Cardiac monitoring reviewed, *** Social determinants considered, *** Critical Interventions: ***  After the interventions stated above, I reevaluated the patient and found *** Admission and further testing considered, ***   {Document  critical care time when appropriate:1} {Document review of labs and clinical decision tools ie heart score, Chads2Vasc2 etc:1}  {Document your independent review of radiology images, and any outside records:1} {Document your discussion with family members, caretakers, and with consultants:1} {Document social determinants of health affecting pt's care:1} {Document your decision making why or why not admission, treatments were needed:1} Final Clinical Impression(s) / ED Diagnoses Final diagnoses:  None    Rx / DC Orders ED Discharge Orders     None

## 2021-12-10 NOTE — ED Triage Notes (Signed)
BIB [facility] for abdominal pain. Abdomen distended. Patient has slight dementia. Abdomen sore to touch. Patient alert confused at times. Last BM today per patient.

## 2021-12-10 NOTE — ED Notes (Signed)
MD disimpacted patient and got small amount of stool out. Patient had a large BM when cleaning him up from the disimpaction. Fleets enema given with results. Adult brief put on patient and patient told to go and to call nurse if he uses the bathroom again. JRPRN

## 2021-12-11 ENCOUNTER — Observation Stay (HOSPITAL_COMMUNITY): Payer: Medicare HMO

## 2021-12-11 ENCOUNTER — Encounter (HOSPITAL_COMMUNITY): Payer: Self-pay | Admitting: Internal Medicine

## 2021-12-11 DIAGNOSIS — I679 Cerebrovascular disease, unspecified: Secondary | ICD-10-CM | POA: Diagnosis not present

## 2021-12-11 DIAGNOSIS — E876 Hypokalemia: Secondary | ICD-10-CM

## 2021-12-11 DIAGNOSIS — K5289 Other specified noninfective gastroenteritis and colitis: Secondary | ICD-10-CM | POA: Diagnosis not present

## 2021-12-11 DIAGNOSIS — F0390 Unspecified dementia without behavioral disturbance: Secondary | ICD-10-CM | POA: Diagnosis not present

## 2021-12-11 DIAGNOSIS — E782 Mixed hyperlipidemia: Secondary | ICD-10-CM | POA: Diagnosis present

## 2021-12-11 DIAGNOSIS — I10 Essential (primary) hypertension: Secondary | ICD-10-CM

## 2021-12-11 DIAGNOSIS — E872 Acidosis, unspecified: Secondary | ICD-10-CM | POA: Diagnosis present

## 2021-12-11 LAB — CBC WITH DIFFERENTIAL/PLATELET
Abs Immature Granulocytes: 0.04 10*3/uL (ref 0.00–0.07)
Basophils Absolute: 0 10*3/uL (ref 0.0–0.1)
Basophils Relative: 0 %
Eosinophils Absolute: 0.1 10*3/uL (ref 0.0–0.5)
Eosinophils Relative: 0 %
HCT: 38.7 % — ABNORMAL LOW (ref 39.0–52.0)
Hemoglobin: 12.1 g/dL — ABNORMAL LOW (ref 13.0–17.0)
Immature Granulocytes: 0 %
Lymphocytes Relative: 13 %
Lymphs Abs: 1.5 10*3/uL (ref 0.7–4.0)
MCH: 27.9 pg (ref 26.0–34.0)
MCHC: 31.3 g/dL (ref 30.0–36.0)
MCV: 89.2 fL (ref 80.0–100.0)
Monocytes Absolute: 1 10*3/uL (ref 0.1–1.0)
Monocytes Relative: 8 %
Neutro Abs: 8.9 10*3/uL — ABNORMAL HIGH (ref 1.7–7.7)
Neutrophils Relative %: 79 %
Platelets: 161 10*3/uL (ref 150–400)
RBC: 4.34 MIL/uL (ref 4.22–5.81)
RDW: 14.8 % (ref 11.5–15.5)
WBC: 11.5 10*3/uL — ABNORMAL HIGH (ref 4.0–10.5)
nRBC: 0 % (ref 0.0–0.2)

## 2021-12-11 LAB — COMPREHENSIVE METABOLIC PANEL
ALT: 13 U/L (ref 0–44)
AST: 18 U/L (ref 15–41)
Albumin: 2.9 g/dL — ABNORMAL LOW (ref 3.5–5.0)
Alkaline Phosphatase: 67 U/L (ref 38–126)
Anion gap: 7 (ref 5–15)
BUN: 14 mg/dL (ref 8–23)
CO2: 21 mmol/L — ABNORMAL LOW (ref 22–32)
Calcium: 8.6 mg/dL — ABNORMAL LOW (ref 8.9–10.3)
Chloride: 111 mmol/L (ref 98–111)
Creatinine, Ser: 0.82 mg/dL (ref 0.61–1.24)
GFR, Estimated: >60 mL/min (ref >60)
Glucose, Bld: 86 mg/dL (ref 70–99)
Potassium: 4.2 mmol/L (ref 3.5–5.1)
Sodium: 139 mmol/L (ref 135–145)
Total Bilirubin: 0.7 mg/dL (ref 0.3–1.2)
Total Protein: 6.1 g/dL — ABNORMAL LOW (ref 6.5–8.1)

## 2021-12-11 LAB — MAGNESIUM: Magnesium: 2.1 mg/dL (ref 1.7–2.4)

## 2021-12-11 LAB — LACTIC ACID, PLASMA: Lactic Acid, Venous: 2 mmol/L (ref 0.5–1.9)

## 2021-12-11 LAB — PROCALCITONIN: Procalcitonin: 0.37 ng/mL

## 2021-12-11 MED ORDER — AMLODIPINE BESYLATE 5 MG PO TABS: 10.0000 mg | ORAL_TABLET | Freq: Every day | ORAL | Status: DC

## 2021-12-11 MED ORDER — POLYETHYLENE GLYCOL 3350 17 G PO PACK
17.0000 g | PACK | Freq: Every day | ORAL | Status: DC | PRN
  Administered 2021-12-16: 17 g via ORAL
  Filled 2021-12-11: qty 1

## 2021-12-11 MED ORDER — LACTATED RINGERS IV SOLN: INTRAVENOUS | Status: AC

## 2021-12-11 MED ORDER — ACETAMINOPHEN 325 MG PO TABS
650.0000 mg | ORAL_TABLET | Freq: Four times a day (QID) | ORAL | Status: DC | PRN
  Administered 2021-12-16: 650 mg via ORAL
  Filled 2021-12-11: qty 2

## 2021-12-11 MED ORDER — LISINOPRIL 10 MG PO TABS: 20.0000 mg | ORAL_TABLET | Freq: Every day | ORAL | Status: DC

## 2021-12-11 MED ORDER — ACETAMINOPHEN 650 MG RE SUPP: 650.0000 mg | Freq: Four times a day (QID) | RECTAL | Status: DC | PRN

## 2021-12-11 MED ORDER — CLOPIDOGREL BISULFATE 75 MG PO TABS
75.0000 mg | ORAL_TABLET | Freq: Every day | ORAL | Status: DC
  Administered 2021-12-11 – 2021-12-17 (×7): 75 mg via ORAL
  Filled 2021-12-11 (×8): qty 1

## 2021-12-11 MED ORDER — MIRTAZAPINE 15 MG PO TABS
15.0000 mg | ORAL_TABLET | Freq: Every day | ORAL | Status: DC
  Administered 2021-12-11 – 2021-12-16 (×6): 15 mg via ORAL
  Filled 2021-12-11 (×6): qty 1

## 2021-12-11 MED ORDER — ONDANSETRON HCL 4 MG/2ML IJ SOLN: 4.0000 mg | Freq: Four times a day (QID) | INTRAMUSCULAR | Status: DC | PRN

## 2021-12-11 MED ORDER — ONDANSETRON HCL 4 MG PO TABS: 4.0000 mg | ORAL_TABLET | Freq: Four times a day (QID) | ORAL | Status: DC | PRN

## 2021-12-11 MED ORDER — POLYETHYLENE GLYCOL 3350 17 G PO PACK
17.0000 g | PACK | Freq: Every day | ORAL | Status: DC
Start: 2021-12-11 — End: 2021-12-17
  Administered 2021-12-13 – 2021-12-17 (×5): 17 g via ORAL
  Filled 2021-12-11 (×7): qty 1

## 2021-12-11 MED ORDER — PIPERACILLIN-TAZOBACTAM 3.375 G IVPB
3.3750 g | Freq: Three times a day (TID) | INTRAVENOUS | Status: DC
  Administered 2021-12-11 – 2021-12-12 (×4): 3.375 g via INTRAVENOUS
  Filled 2021-12-11 (×4): qty 50

## 2021-12-11 MED ORDER — ENOXAPARIN SODIUM 40 MG/0.4ML IJ SOSY
40.0000 mg | PREFILLED_SYRINGE | INTRAMUSCULAR | Status: DC
  Administered 2021-12-11 – 2021-12-17 (×7): 40 mg via SUBCUTANEOUS
  Filled 2021-12-11 (×7): qty 0.4

## 2021-12-11 MED ORDER — HYDRALAZINE HCL 20 MG/ML IJ SOLN: 10.0000 mg | Freq: Four times a day (QID) | INTRAMUSCULAR | Status: DC | PRN

## 2021-12-11 MED ORDER — SENNOSIDES-DOCUSATE SODIUM 8.6-50 MG PO TABS
2.0000 | ORAL_TABLET | Freq: Every day | ORAL | Status: DC
  Administered 2021-12-12 – 2021-12-17 (×6): 2 via ORAL
  Filled 2021-12-11 (×7): qty 2

## 2021-12-11 MED ORDER — ATORVASTATIN CALCIUM 40 MG PO TABS
60.0000 mg | ORAL_TABLET | Freq: Every day | ORAL | Status: DC
  Administered 2021-12-11 – 2021-12-17 (×7): 60 mg via ORAL
  Filled 2021-12-11 (×7): qty 2

## 2021-12-11 MED ORDER — ENSURE ENLIVE PO LIQD
237.0000 mL | Freq: Two times a day (BID) | ORAL | Status: DC
  Administered 2021-12-12 – 2021-12-13 (×3): 237 mL via ORAL

## 2021-12-11 MED ORDER — MELATONIN 3 MG PO TABS
3.0000 mg | ORAL_TABLET | Freq: Every day | ORAL | Status: DC
  Administered 2021-12-11 – 2021-12-16 (×6): 3 mg via ORAL
  Filled 2021-12-11 (×6): qty 1

## 2021-12-11 NOTE — Assessment & Plan Note (Addendum)
   Patient presenting with abdominal pain with evidence of substantial constipation and stercoral colitis on CT imaging  EDP attempted manual fecal impaction and was unsuccessful initially however patient seemed to have an extremely large bowel movement shortly thereafter after administration of Fleet enema  We will attempt to place patient on a regular bowel regimen with scheduled MiraLAX and senna daily.  This can be adjusted upwards as necessary to achieve regular bowel movements  Abdomen continues to be tender.  Presence of stercoral colitis placing patient at high risk of complications such as perforation.  We will therefore additionally reassess degree of stool burden this morning with abdominal x-ray as well as ensuring there is no evidence of perforation before proceeding with further scheduled laxatives  Hydrating patient with intravenous isotonic fluids as patient's dehydration is likely exacerbating constipation.  Empiric antibiotics for now

## 2021-12-11 NOTE — Assessment & Plan Note (Signed)
.   Continuing home regimen of lipid lowering therapy.  

## 2021-12-11 NOTE — Assessment & Plan Note (Signed)
.   Temporarily holding home regimen of antihypertensives as patient is exhibiting low normal blood pressures . Will resume home antihypertensives as blood pressure tolerates . Will likely maintain relaxed blood pressure targets due to advanced age and high risk of recurrent volume depletion . PRN intravenous antihypertensives for excessively elevated blood pressure

## 2021-12-11 NOTE — Assessment & Plan Note (Signed)
·   Replacing with potassium chloride °· Evaluating for concurrent hypomagnesemia  °· Monitoring potassium levels with serial chemistries. ° °

## 2021-12-11 NOTE — Progress Notes (Signed)
Pharmacy Antibiotic Note  Jeff Wilson is a 81 y.o. male admitted on 12/10/2021 with abdominal pain. CT abdomen/pelvis showed concern for fecal impaction vs stercoral colitis. Pharmacy has been consulted for Zosyn dosing for intra-abdominal infection.  Plan: Zosyn 3.375g IV q8h (4 hour infusion). Monitor clinical improvement, ability to narrow antibiotics, antibiotic length of therapy  Height: 5\' 9"  (175.3 cm) Weight: 91.6 kg (202 lb) IBW/kg (Calculated) : 70.7  Temp (24hrs), Avg:98.2 F (36.8 C), Min:97.9 F (36.6 C), Max:98.6 F (37 C)  Recent Labs  Lab 12/10/21 2055 12/10/21 2240  WBC 16.2*  --   CREATININE 0.92  --   LATICACIDVEN 3.0* 2.4*    Estimated Creatinine Clearance: 72.8 mL/min (by C-G formula based on SCr of 0.92 mg/dL).    No Known Allergies  Antimicrobials this admission: Zosyn 8/14 >>   Dose adjustments this admission:  Microbiology results: 8/15 BCx: ordered   Thank you for allowing pharmacy to be a part of this patient's care.  9/15, PharmD, BCPS 12/11/2021 9:19 AM

## 2021-12-11 NOTE — H&P (Addendum)
History and Physical    Patient: Jeff Wilson MRN: [id number] DOA: 12/10/2021  Date of Service: the patient was seen and examined on 12/11/2021  Patient coming from: SNF  Chief Complaint:  Abdominal pain  HPI:   81 year old male with past medical history of dementia, TIA, hypertension hyperlipidemia, who presents to [facility] emergency department via EMS with complaints of abdominal pain.  Patient is a poor historian due to known history of dementia.  I have attempted to contact the family via the listed phone number on the facesheet to obtain further history and have been unsuccessful.  Patient explains that he has been experiencing abdominal pain.  Patient reports that this pain began yesterday.  Pain is moderate to severe in intensity and generalized.  Patient is unable to really describe the pain much further.  Patient is also complaining of associated constipation.  Patient is not a begin exhibiting any fevers nausea vomiting, fevers or dysuria as of late.  Due to persisting symptoms EMS was contacted who promptly came to evaluate the patient and brought him to [facility] emergency department for evaluation.  Upon evaluation in the emergency department patient was found to have a substantial lactic acidosis of 3.0 as well as a substantial lactic acidosis of 16.2.  CT imaging of the abdomen and pelvis was performed revealing a distended colon with a large amount of retained stool with perirectal fat stranding concerning for fecal impaction versus stercoral colitis.  EDP did attempted fecal disimpaction and was unsuccessful.  A Fleet enema was ordered and the hospitalist group was then called to assess the patient for admission to the hospital.   Review of Systems: Review of Systems  Unable to perform ROS: Dementia     Past Medical History:  Diagnosis Date   Dementia ([place])    Hyperlipemia    TIA (transient ischemic attack)     History reviewed. No pertinent  surgical history.  Social History:  reports that he has quit smoking. He has never used smokeless tobacco. No history on file for alcohol use and drug use.  No Known Allergies  Family History  Problem Relation Age of Onset   Heart disease Neg Hx     Prior to Admission medications   Medication Sig Start Date End Date Taking? Authorizing Provider  acetaminophen (TYLENOL) 500 MG tablet Take 500 mg by mouth every 6 (six) hours as needed for mild pain.   Yes [provider]  amLODipine (NORVASC) 10 MG tablet Take 10 mg by mouth daily.   Yes [provider]  atorvastatin (LIPITOR) 20 MG tablet Take 60 mg by mouth daily. 11/05/21  Yes [provider]  calcium carbonate (TUMS - DOSED IN MG ELEMENTAL CALCIUM) 500 MG chewable tablet Chew 1 tablet by mouth daily.   Yes [provider]  clopidogrel (PLAVIX) 75 MG tablet Take 75 mg by mouth daily.   Yes [provider]  ketoconazole (NIZORAL) 2 % shampoo Apply 1 Application topically. On Mondays every 2 weeks   Yes [provider]  lisinopril (ZESTRIL) 20 MG tablet Take 20 mg by mouth daily. 12/03/21  Yes [provider]  melatonin 3 MG TABS tablet Take 3 mg by mouth at bedtime.   Yes [provider]  mirtazapine (REMERON) 15 MG tablet Take 15 mg by mouth at bedtime.   Yes [provider]  Psyllium 400 MG CAPS Take 1 tablet by mouth daily.   Yes [provider]  sennosides-docusate sodium (SENOKOT-S)  8.6-50 MG tablet Take 2 tablets by mouth daily.   Yes [provider]  Vitamin D, Ergocalciferol, (DRISDOL) 1.25 MG (50000 UNIT) CAPS capsule Take 50,000 Units by mouth every Monday.   Yes [provider]  doxycycline (VIBRA-TABS) 100 MG tablet Take 1 tablet (100 mg total) by mouth 2 (two) times daily. Patient not taking: Reported on 12/10/2021 06/19/20   Felecia Shelling, DPM    Physical Exam:  Vitals:   12/11/21 0600 12/11/21 0630 12/11/21 0700  12/11/21 0730  BP: 105/69 135/83 112/75 106/75  Pulse: 76 81 72 76  Resp: 18 18 18 20   Temp:    97.9 F (36.6 C)  TempSrc:      SpO2: 98% 99% 98% 99%  Weight:      Height:        Constitutional: Lethargic but arousable oriented x1, in distress due to abdominal pain.  \ Skin: no rashes, no lesions, poor skin turgor noted. Eyes: Pupils are equally reactive to light.  No evidence of scleral icterus or conjunctival pallor.  ENMT: Foul-smelling breath, dry mucous membranes noted.  Posterior pharynx clear of any exudate or lesions.   Neck: normal, supple, no masses, no thyromegaly.  No evidence of jugular venous distension.   Respiratory: clear to auscultation bilaterally, no wheezing, no crackles. Normal respiratory effort. No accessory muscle use.  Cardiovascular: Regular rate and rhythm, no murmurs / rubs / gallops.  Notable distal bilateral lower extremity pitting edema.  2+ pedal pulses. No carotid bruits.  Chest:   Nontender without crepitus or deformity.   Back:   Nontender without crepitus or deformity. Abdomen: Diffuse abdominal tenderness.  Abdomen soft however..  No evidence of intra-abdominal masses.  Positive bowel sounds noted in all quadrants.   Musculoskeletal: No joint deformity upper and lower extremities. Good ROM, no contractures. Normal muscle tone.  Neurologic: Patient does follow commands.  Patient is lethargic arousable and oriented x1.  Patient is able to move all 4 extremities spontaneously.  Patient is responsive to verbal and painful stimuli.   Psychiatric: Unable to fully assess due to advanced dementia.  Patient currently does not seem to possess insight as to his current situation.  Data Reviewed:  I have personally reviewed and interpreted labs, imaging.  Significant findings are:  CT imaging of the abdomen and pelvis with contrast revealing a distended colon with a large amount of retained stool in the sigmoid colon and rectum with mild pararectal fat  stranding possible fecal impaction versus stercoral colitis. CBC revealing white blood cell count of 16.2 hemoglobin of 13.3, hematocrit 41.1, platelet count 215 Chemistry revealing sodium 138, potassium 3.0, chloride 105, bicarbonate 23, BUN 20, creatinine 0.92  EKG: Personally reviewed.  Rhythm is normal sinus rhythm with heart rate of 97 bpm.  Left anterior fascicular block.  No dynamic ST segment changes appreciated.   Assessment and Plan: * Stercoral colitis Patient presenting with abdominal pain with evidence of substantial constipation and stercoral colitis on CT imaging EDP attempted manual fecal impaction and was unsuccessful initially however patient seemed to have an extremely large bowel movement shortly thereafter after administration of Fleet enema We will attempt to place patient on a regular bowel regimen with scheduled MiraLAX and senna daily.  This can be adjusted upwards as necessary to achieve regular bowel movements Abdomen continues to be tender.  Presence of stercoral colitis placing patient at high risk of complications such as perforation. We will therefore additionally reassess degree of stool burden this morning with  abdominal x-ray as well as ensuring there is no evidence of perforation before proceeding with further scheduled laxatives Hydrating patient with intravenous isotonic fluids as patient's dehydration is likely exacerbating constipation. Empiric antibiotics for now  Lactic acidosis Lactic acidosis likely secondary to severe volume depletion Hydrating patient intravenous isotonic fluids  Monitoring serial lactic acid levels to ensure downtrending and resolution.   Hypokalemia Replacing with potassium chloride Evaluating for concurrent hypomagnesemia  Monitoring potassium levels with serial chemistries.   Essential hypertension Temporarily holding home regimen of antihypertensives as patient is exhibiting low normal blood pressures Will resume home  antihypertensives as blood pressure tolerates Will likely maintain relaxed blood pressure targets due to advanced age and high risk of recurrent volume depletion PRN intravenous antihypertensives for excessively elevated blood pressure    Mixed hyperlipidemia Continuing home regimen of lipid lowering therapy.   Dementia without behavioral disturbance (HCC) Longstanding known history of dementia and cognitive deficit Prognosis guarded Baseline unclear, I have been unsuccessful in contacting the family via the listed phone number Minimizing uncomfortable stimuli Minimizing mood altering and sedating agents Frequent redirection by staff Fall precautions        Code Status:  Full code  code status -unable to confirm with family Family Communication: I have been unsuccessful in contacting family via the listed phone number  Consults: None  Severity of Illness:  The appropriate patient status for this patient is OBSERVATION. Observation status is judged to be reasonable and necessary in order to provide the required intensity of service to ensure the patient's safety. The patient's presenting symptoms, physical exam findings, and initial radiographic and laboratory data in the context of their medical condition is felt to place them at decreased risk for further clinical deterioration. Furthermore, it is anticipated that the patient will be medically stable for discharge from the hospital within 2 midnights of admission.   Author:  Marinda Elk MD  12/11/2021 9:18 AM

## 2021-12-11 NOTE — Assessment & Plan Note (Signed)
   Lactic acidosis likely secondary to severe volume depletion  Hydrating patient intravenous isotonic fluids   Monitoring serial lactic acid levels to ensure downtrending and resolution.

## 2021-12-11 NOTE — Assessment & Plan Note (Addendum)
   Longstanding known history of dementia and cognitive deficit  Prognosis guarded  Baseline unclear, I have been unsuccessful in contacting the family via the listed phone number  Minimizing uncomfortable stimuli  Minimizing mood altering and sedating agents  Frequent redirection by staff  Fall precautions

## 2021-12-11 NOTE — Progress Notes (Signed)
Patient was placed on telemetry. Patient went tachycardic and telemetry called and stated they observed what seemed to be third degree heart block. EKG done and placed in patients chart. Paged Dr. Joen Laura, who reviewed EKG's and concluded that EKG showed normal sinus rhythm. Will continue to monitor telemetry.

## 2021-12-11 NOTE — Progress Notes (Signed)
  Progress Note   Patient: Jeff Wilson [id number] DOB: Apr 10, 1941 DOA: 12/10/2021     0 DOS: the patient was seen and examined on 12/11/2021        Brief hospital course: This is a no charge note, for further details, please see the H&P by my partner Dr. Leafy Half from earlier today     Assessment and Plan: * Stercoral colitis Had 3 good sized BMs after he was manually disimpacted after the CT abdomen and pelvis.  Still with some discomfort in the abdomen, plain radiograph obtained and this showed no free air, just possible ileus (clinically he is a little distended but having BMs)  - Continue IV antibiotics - Continue IV fluids - Continue MiraLAX    Lactic acidosis Lactic acidosis likely secondary to severe volume depletion, sepsis ruled out.  - Continue IV fluids  Hypokalemia   Essential hypertension - Hold home meds   Mixed hyperlipidemia   Dementia without behavioral disturbance (HCC)                Physical Exam: Vitals:   12/11/21 0730 12/11/21 0900 12/11/21 1130 12/11/21 1319  BP: 106/75 112/74 125/71 128/68  Pulse: 76 70 72 80  Resp: 20 16 17 16   Temp: 97.9 F (36.6 C)  98.7 F (37.1 C) 97.9 F (36.6 C)  TempSrc:   Oral   SpO2: 99% 98% 99% 99%  Weight:      Height:               Disposition: Status is: Observation         Author: , MD 12/11/2021 5:05 PM  For on call review [email or web address].

## 2021-12-12 ENCOUNTER — Observation Stay (HOSPITAL_COMMUNITY): Payer: Medicare HMO

## 2021-12-12 DIAGNOSIS — K567 Ileus, unspecified: Secondary | ICD-10-CM

## 2021-12-12 DIAGNOSIS — K5289 Other specified noninfective gastroenteritis and colitis: Secondary | ICD-10-CM | POA: Diagnosis not present

## 2021-12-12 DIAGNOSIS — F0390 Unspecified dementia without behavioral disturbance: Secondary | ICD-10-CM | POA: Diagnosis not present

## 2021-12-12 LAB — BLOOD CULTURE ID PANEL (REFLEXED) - BCID2

## 2021-12-12 LAB — COMPREHENSIVE METABOLIC PANEL
ALT: 12 U/L (ref 0–44)
AST: 18 U/L (ref 15–41)
Albumin: 2.7 g/dL — ABNORMAL LOW (ref 3.5–5.0)
Alkaline Phosphatase: 58 U/L (ref 38–126)
Anion gap: 9 (ref 5–15)
BUN: 12 mg/dL (ref 8–23)
CO2: 23 mmol/L (ref 22–32)
Calcium: 8.4 mg/dL — ABNORMAL LOW (ref 8.9–10.3)
Chloride: 110 mmol/L (ref 98–111)
Creatinine, Ser: 0.87 mg/dL (ref 0.61–1.24)
GFR, Estimated: >60 mL/min (ref >60)
Glucose, Bld: 82 mg/dL (ref 70–99)
Potassium: 3 mmol/L — ABNORMAL LOW (ref 3.5–5.1)
Sodium: 142 mmol/L (ref 135–145)
Total Bilirubin: 0.6 mg/dL (ref 0.3–1.2)
Total Protein: 6 g/dL — ABNORMAL LOW (ref 6.5–8.1)

## 2021-12-12 LAB — CBC WITH DIFFERENTIAL/PLATELET
Abs Immature Granulocytes: 0.02 10*3/uL (ref 0.00–0.07)
Basophils Absolute: 0 10*3/uL (ref 0.0–0.1)
Basophils Relative: 1 %
Eosinophils Absolute: 0.1 10*3/uL (ref 0.0–0.5)
Eosinophils Relative: 1 %
HCT: 34.5 % — ABNORMAL LOW (ref 39.0–52.0)
Hemoglobin: 11.3 g/dL — ABNORMAL LOW (ref 13.0–17.0)
Immature Granulocytes: 0 %
Lymphocytes Relative: 18 %
Lymphs Abs: 1.2 10*3/uL (ref 0.7–4.0)
MCH: 28.5 pg (ref 26.0–34.0)
MCHC: 32.8 g/dL (ref 30.0–36.0)
MCV: 86.9 fL (ref 80.0–100.0)
Monocytes Absolute: 0.6 10*3/uL (ref 0.1–1.0)
Monocytes Relative: 9 %
Neutro Abs: 5 10*3/uL (ref 1.7–7.7)
Neutrophils Relative %: 71 %
Platelets: 163 10*3/uL (ref 150–400)
RBC: 3.97 MIL/uL — ABNORMAL LOW (ref 4.22–5.81)
RDW: 14.6 % (ref 11.5–15.5)
WBC: 7 10*3/uL (ref 4.0–10.5)
nRBC: 0 % (ref 0.0–0.2)

## 2021-12-12 LAB — MAGNESIUM: Magnesium: 2 mg/dL (ref 1.7–2.4)

## 2021-12-12 MED ORDER — VANCOMYCIN HCL 2000 MG/400ML IV SOLN
2000.0000 mg | Freq: Once | INTRAVENOUS | Status: AC
  Administered 2021-12-12: 2000 mg via INTRAVENOUS
  Filled 2021-12-12: qty 400

## 2021-12-12 MED ORDER — VANCOMYCIN HCL 2000 MG/400ML IV SOLN
2000.0000 mg | INTRAVENOUS | Status: DC
  Administered 2021-12-13 – 2021-12-16 (×4): 2000 mg via INTRAVENOUS
  Filled 2021-12-12 (×5): qty 400

## 2021-12-12 MED ORDER — POTASSIUM CHLORIDE CRYS ER 20 MEQ PO TBCR
40.0000 meq | EXTENDED_RELEASE_TABLET | ORAL | Status: AC
  Administered 2021-12-12 (×2): 40 meq via ORAL
  Filled 2021-12-12 (×2): qty 2

## 2021-12-12 NOTE — Progress Notes (Signed)
Pharmacy Antibiotic Note  Jeff Wilson is a 81 y.o. male admitted on 12/10/2021 with abdominal pain. CT abdomen/pelvis showed concern for fecal impaction vs stercoral colitis. Patient originally started on Zosyn for intra-abdominal infection, which was discontinued 8/16. Blood cultures initially positive 1/4 bottles MRSE, suspect contaminant. Now positive in 2/4 bottles GPC clusters (one bottle each set). Pharmacy has been consulted for vancomycin dosing pending final cultures.  Plan: -Vancomycin 2 g q24h  -Pharmacy to continue to follow renal function, cultures and clinical progress for dose adjustments and de-escalation as indicated  Height: 5\' 9"  (175.3 cm) Weight: 91.6 kg (202 lb) IBW/kg (Calculated) : 70.7  Temp (24hrs), Avg:98.2 F (36.8 C), Min:97.8 F (36.6 C), Max:98.6 F (37 C)  Recent Labs  Lab 12/10/21 2055 12/10/21 2240 12/11/21 0919 12/12/21 0742  WBC 16.2*  --  11.5* 7.0  CREATININE 0.92  --  0.82 0.87  LATICACIDVEN 3.0* 2.4* 2.0*  --      Estimated Creatinine Clearance: 77 mL/min (by C-G formula based on SCr of 0.87 mg/dL).    No Known Allergies  Antimicrobials/ this admission: Vancomycin 8/16 >> Zosyn 8/14 >> 8/16  Dose adjustments this admission: NA  Microbiology results: 8/15 BCx: 2/4 bottles (2/2 sets) MRSE   Thank you for allowing pharmacy to be a part of this patient's care.  9/15, PharmD, BCPS Clinical Pharmacist 12/12/2021 4:04 PM

## 2021-12-12 NOTE — Hospital Course (Addendum)
81 year old man PMH including dementia, TIA presenting with abdominal pain, constipation.  Noted to have lactic acidosis and mild leukocytosis.  CT abdomen and pelvis demonstrated distended colon with large amount of retained stool, perirectal fat stranding concerning for fecal impaction versus stercoral colitis.  Disimpaction with modest results but responded well to Fleet enema.  Admitted for stercoral colitis/fecal impaction.  Started on aggressive bowel regimen.  Had 3 good sized bowel movements.  Plain radiograph showed no free air, now has ileus.  Also with bacteremia, initially thought to be contaminant but now in 2 bottles.  Ileus now resolving.  Discussed with ID, will go ahead and treat for total 5 days for bacteremia given presence in 2 sets.  Repeat blood cultures pending.  Likely discharge 1 to 2 days.

## 2021-12-12 NOTE — Progress Notes (Signed)
24 hour chart audit completed 

## 2021-12-12 NOTE — Plan of Care (Signed)

## 2021-12-12 NOTE — Evaluation (Signed)
Physical Therapy Evaluation Patient Details Name: Jeff Wilson MRN: [id number] DOB: 03-31-1941 Today's Date: 12/12/2021  History of Present Illness  Patient is 81 y.o. male  presents to [facility] emergency department via EMS with complaints of abdominal pain. PMH significant of dementia, TIA, hypertension hyperlipidemia.   Clinical Impression  Jeff Wilson is 81 y.o. male admitted with above HPI and diagnosis. Patient is currently limited by functional impairments below (see PT problem list). Patient unable to provide history due to memory impairments and unclear if pt's self report on PLOF of independent ot mobilize is accurate as not family available to confirm. Currently pt requires Max/Total assist for bed mobility and was limited to bed due to large loose BM. All extremities are stiff and pt with little effort to assist with mobility. Patient will benefit from continued skilled PT interventions to address impairments and progress independence with mobility, recommending SNF rehab at this time. Acute PT will follow and progress as able.        Recommendations for follow up therapy are one component of a multi-disciplinary discharge planning process, led by the attending physician.  Recommendations may be updated based on patient status, additional functional criteria and insurance authorization.  Follow Up Recommendations Skilled nursing-short term rehab (<3 hours/day) Can patient physically be transported by private vehicle: No    Assistance Recommended at Discharge Frequent or constant Supervision/Assistance  Patient can return home with the following  Two people to help with walking and/or transfers;A lot of help with bathing/dressing/bathroom;Assistance with cooking/housework;Assistance with feeding;Direct supervision/assist for medications management;Direct supervision/assist for financial management;Assist for transportation;Help with stairs or ramp for entrance     Equipment Recommendations    Recommendations for Other Services       Functional Status Assessment Patient has had a recent decline in their functional status and/or demonstrates limited ability to make significant improvements in function in a reasonable and predictable amount of time     Precautions / Restrictions Precautions Precautions: Fall Restrictions Weight Bearing Restrictions: No      Mobility  Bed Mobility Overal bed mobility: Needs Assistance Bed Mobility: Rolling Rolling: Max assist, Total assist, +2 for physical assistance, +2 for safety/equipment         General bed mobility comments: pt making limited effort in rolling despite multimodal cues. Pt required Max/Total +2 assist to fully roll for linen change and pericare as pt had watery diahrrea in bed. deferred acivity to EOB at this time.    Transfers                        Ambulation/Gait                  Stairs            Wheelchair Mobility    Modified Rankin (Stroke Patients Only)       Balance                                             Pertinent Vitals/Pain      Home Living Family/patient expects to be discharged to:: Skilled nursing facility                   Additional Comments: pt unable to provide full history.    Prior Function Prior Level of Function : Patient poor historian/Family not available  Mobility Comments: pt reports he was mobilizing at NF independently, no family present to confirm or provide history.       Hand Dominance        Extremity/Trunk Assessment   Upper Extremity Assessment Upper Extremity Assessment: Generalized weakness    Lower Extremity Assessment Lower Extremity Assessment: Generalized weakness (pt very stiff and unable to follow cues to flex bil knees, with PROM pt resisting motion for positioning)    Cervical / Trunk Assessment Cervical / Trunk Assessment: Normal   Communication   Communication: No difficulties  Cognition Arousal/Alertness: Awake/alert Behavior During Therapy: WFL for tasks assessed/performed Overall Cognitive Status: No family/caregiver present to determine baseline cognitive functioning                                 General Comments: pt has history of dementia, no family present.        General Comments      Exercises     Assessment/Plan    PT Assessment Patient needs continued PT services  PT Problem List Decreased strength;Decreased activity tolerance;Decreased balance;Decreased mobility;Decreased coordination;Decreased cognition;Decreased knowledge of use of DME;Decreased safety awareness;Decreased knowledge of precautions       PT Treatment Interventions DME instruction;Gait training;Stair training;Functional mobility training;Therapeutic activities;Therapeutic exercise;Balance training;Neuromuscular re-education;Cognitive remediation;Patient/family education;Wheelchair mobility training    PT Goals (Current goals can be found in the Care Plan section)  Acute Rehab PT Goals Patient Stated Goal: none stated PT Goal Formulation: Patient unable to participate in goal setting Time For Goal Achievement: 12/26/21 Potential to Achieve Goals: Fair    Frequency Min 2X/week     Co-evaluation               AM-PAC PT "6 Clicks" Mobility  Outcome Measure Help needed turning from your back to your side while in a flat bed without using bedrails?: Total Help needed moving from lying on your back to sitting on the side of a flat bed without using bedrails?: Total Help needed moving to and from a bed to a chair (including a wheelchair)?: Total Help needed standing up from a chair using your arms (e.g., wheelchair or bedside chair)?: Total Help needed to walk in hospital room?: Total Help needed climbing 3-5 steps with a railing? : Total 6 Click Score: 6    End of Session   Activity Tolerance:  Other (comment) (limited by BM) Patient left: in bed;with bed alarm set Nurse Communication: Mobility status PT Visit Diagnosis: Unsteadiness on feet (R26.81);Other abnormalities of gait and mobility (R26.89);Muscle weakness (generalized) (M62.81);Difficulty in walking, not elsewhere classified (R26.2)    Time: 4010-2725 PT Time Calculation (min) (ACUTE ONLY): 27 min   Charges:   PT Evaluation $PT Eval Moderate Complexity: 1 Mod PT Treatments $Therapeutic Activity: 8-22 mins        Wynn Maudlin, DPT Acute Rehabilitation Services Office [phone] Pager [phone]  12/12/21 12:48 PM

## 2021-12-12 NOTE — Plan of Care (Signed)
  Problem: Activity: Goal: Risk for activity intolerance will decrease Outcome: Not Progressing   

## 2021-12-12 NOTE — Progress Notes (Signed)
PHARMACY - PHYSICIAN COMMUNICATION CRITICAL VALUE ALERT - BLOOD CULTURE IDENTIFICATION (BCID)  Jeff Wilson is an 81 y.o. male who presented to [facility] on 12/10/2021 with a chief complaint of abdominal pain s/t stercoral colitis.  Assessment:   2/4 bottles (2/2 sets) MRSE Original BCID call with 1/4 bottles MRSE, likely contaminant. Updated cultures now with 2/4 bottles, one in each set.  Name of physician (or Provider) Contacted: Dr. Irene Limbo  Current antibiotics: none (Zosyn discontinued earlier today)  Changes to prescribed antibiotics recommended: suspect cultures likely still contaminants but will cover with vancomycin while awaiting final cultures.  Results for orders placed or performed during the hospital encounter of 12/10/21  Blood Culture ID Panel (Reflexed) (Collected: 12/11/2021  9:38 AM)  Result Value Ref Range   Enterococcus faecalis NOT DETECTED NOT DETECTED   Enterococcus Faecium NOT DETECTED NOT DETECTED   Listeria monocytogenes NOT DETECTED NOT DETECTED   Staphylococcus species DETECTED (A) NOT DETECTED   Staphylococcus aureus (BCID) NOT DETECTED NOT DETECTED   Staphylococcus epidermidis DETECTED (A) NOT DETECTED   Staphylococcus lugdunensis NOT DETECTED NOT DETECTED   Streptococcus species NOT DETECTED NOT DETECTED   Streptococcus agalactiae NOT DETECTED NOT DETECTED   Streptococcus pneumoniae NOT DETECTED NOT DETECTED   Streptococcus pyogenes NOT DETECTED NOT DETECTED   A.calcoaceticus-baumannii NOT DETECTED NOT DETECTED   Bacteroides fragilis NOT DETECTED NOT DETECTED   Enterobacterales NOT DETECTED NOT DETECTED   Enterobacter cloacae complex NOT DETECTED NOT DETECTED   Escherichia coli NOT DETECTED NOT DETECTED   Klebsiella aerogenes NOT DETECTED NOT DETECTED   Klebsiella oxytoca NOT DETECTED NOT DETECTED   Klebsiella pneumoniae NOT DETECTED NOT DETECTED   Proteus species NOT DETECTED NOT DETECTED   Salmonella species NOT DETECTED NOT DETECTED    Serratia marcescens NOT DETECTED NOT DETECTED   Haemophilus influenzae NOT DETECTED NOT DETECTED   Neisseria meningitidis NOT DETECTED NOT DETECTED   Pseudomonas aeruginosa NOT DETECTED NOT DETECTED   Stenotrophomonas maltophilia NOT DETECTED NOT DETECTED   Candida albicans NOT DETECTED NOT DETECTED   Candida auris NOT DETECTED NOT DETECTED   Candida glabrata NOT DETECTED NOT DETECTED   Candida krusei NOT DETECTED NOT DETECTED   Candida parapsilosis NOT DETECTED NOT DETECTED   Candida tropicalis NOT DETECTED NOT DETECTED   Cryptococcus neoformans/gattii NOT DETECTED NOT DETECTED   Methicillin resistance mecA/C DETECTED (A) NOT DETECTED    Pricilla Riffle, PharmD, BCPS Clinical Pharmacist 12/12/2021 3:57 PM

## 2021-12-12 NOTE — ED Notes (Signed)
Positive culture called to the incorrect person and department. Pharmacist Alisia Ferrari made aware to ensure he is aware and watching for the culture and sensitivity report to be sure the correct antibiotics are ordered.

## 2021-12-12 NOTE — Progress Notes (Signed)
  Progress Note   Patient: Jeff Wilson BJY:[id number] DOB: 1940-12-29 DOA: 12/10/2021     0 DOS: the patient was seen and examined on 12/12/2021   Brief hospital course: 81 year old man PMH including dementia, TIA presenting with abdominal pain, constipation.  Noted to have lactic acidosis and mild leukocytosis.  CT abdomen and pelvis demonstrated distended colon with large amount of retained stool, perirectal fat stranding concerning for fecal impaction versus stercoral colitis.  Disimpaction with modest results but responded well to Fleet enema.  Admitted for stercoral colitis/fecal impaction.  Started on aggressive bowel regimen.  Had 3 good sized bowel movements.  Plain radiograph showed no free air, could not exclude ileus.  Assessment and Plan:  Stercoral colitis --Overall improving.  Bowels moving.  Still has some abdominal tenderness.  Blood cultures positive, suspect contaminant but given 2 bottles, will await final culture results and continue antibiotics.  Ileus, mild, tolerating diet.  Follow clinically. --Repeat abdominal x-ray today demonstrated ileus.  Replete potassium.  Keep magnesium at goal.   Lactic acidosis --Secondary to volume depletion.  Sepsis ruled out.    Hypokalemia -- Replete.  Magnesium within normal limits.    Essential hypertension --Stable off antihypertensives.    Dementia without behavioral disturbance (HCC) --Longstanding known history of dementia and cognitive deficit --Stable.  Return to [facility] long-term care.  Aortic atherosclerosis.      Subjective:  Feels ok but still has abd pain and bloating Bowels moving Ate this am  Physical Exam: Vitals:   12/11/21 1319 12/12/21 0128 12/12/21 0530 12/12/21 1557  BP: 128/68 128/64 129/80 139/61  Pulse: 80 66 71 95  Resp: 16 18 14 16   Temp: 97.9 F (36.6 C) 97.8 F (36.6 C) 98.6 F (37 C) 98.3 F (36.8 C)  TempSrc: Oral Oral Oral Oral  SpO2: 99% 99% 97% 98%  Weight:      Height:        Physical Exam Vitals reviewed.  Constitutional:      General: He is not in acute distress.    Appearance: He is not ill-appearing or toxic-appearing.  Cardiovascular:     Rate and Rhythm: Normal rate and regular rhythm.     Heart sounds: No murmur heard.    Comments: Telemetry SR w/ premature supraventricular beats Pulmonary:     Effort: Pulmonary effort is normal. No respiratory distress.     Breath sounds: No wheezing, rhonchi or rales.  Abdominal:     General: There is distension.     Tenderness: There is abdominal tenderness. There is no guarding.     Comments: Tympanic, painful to touch  Neurological:     Mental Status: He is alert.  Psychiatric:        Mood and Affect: Mood normal.        Behavior: Behavior normal.     Data Reviewed:  BM x6 Potassium 3.0, magnesium within normal limits Lactic acid trended down on admission Leukocytosis resolved, 7.0 today Hemoglobin stable 11.3 likely reflection of dilution  Family Communication: none  Disposition: Status is: Observation   Planned Discharge Destination:  return to LTC    Time spent: 35 minutes  Author: , MD 12/12/2021 7:37 PM  For on call review [email or web address].

## 2021-12-12 NOTE — Progress Notes (Signed)
PHARMACY - PHYSICIAN COMMUNICATION CRITICAL VALUE ALERT - BLOOD CULTURE IDENTIFICATION (BCID)  Jeff Wilson is an 81 y.o. male who presented to [facility] on 12/10/2021 with a chief complaint of abdominal pain d/t stercoral colitis.  Assessment:  1/4 BCx with MRSE (suspect contaminant)  Name of physician (or Provider) Contacted: Irene Limbo  Current antibiotics: Zosyn  Changes to prescribed antibiotics recommended: none, consider stopping antibiotics altogether if no new signs of infection, perforation, etc. Patient is on recommended antibiotics - No changes needed  Results for orders placed or performed during the hospital encounter of 12/10/21  Blood Culture ID Panel (Reflexed) (Collected: 12/11/2021  9:38 AM)  Result Value Ref Range   Enterococcus faecalis NOT DETECTED NOT DETECTED   Enterococcus Faecium NOT DETECTED NOT DETECTED   Listeria monocytogenes NOT DETECTED NOT DETECTED   Staphylococcus species DETECTED (A) NOT DETECTED   Staphylococcus aureus (BCID) NOT DETECTED NOT DETECTED   Staphylococcus epidermidis DETECTED (A) NOT DETECTED   Staphylococcus lugdunensis NOT DETECTED NOT DETECTED   Streptococcus species NOT DETECTED NOT DETECTED   Streptococcus agalactiae NOT DETECTED NOT DETECTED   Streptococcus pneumoniae NOT DETECTED NOT DETECTED   Streptococcus pyogenes NOT DETECTED NOT DETECTED   A.calcoaceticus-baumannii NOT DETECTED NOT DETECTED   Bacteroides fragilis NOT DETECTED NOT DETECTED   Enterobacterales NOT DETECTED NOT DETECTED   Enterobacter cloacae complex NOT DETECTED NOT DETECTED   Escherichia coli NOT DETECTED NOT DETECTED   Klebsiella aerogenes NOT DETECTED NOT DETECTED   Klebsiella oxytoca NOT DETECTED NOT DETECTED   Klebsiella pneumoniae NOT DETECTED NOT DETECTED   Proteus species NOT DETECTED NOT DETECTED   Salmonella species NOT DETECTED NOT DETECTED   Serratia marcescens NOT DETECTED NOT DETECTED   Haemophilus influenzae NOT DETECTED NOT DETECTED    Neisseria meningitidis NOT DETECTED NOT DETECTED   Pseudomonas aeruginosa NOT DETECTED NOT DETECTED   Stenotrophomonas maltophilia NOT DETECTED NOT DETECTED   Candida albicans NOT DETECTED NOT DETECTED   Candida auris NOT DETECTED NOT DETECTED   Candida glabrata NOT DETECTED NOT DETECTED   Candida krusei NOT DETECTED NOT DETECTED   Candida parapsilosis NOT DETECTED NOT DETECTED   Candida tropicalis NOT DETECTED NOT DETECTED   Cryptococcus neoformans/gattii NOT DETECTED NOT DETECTED   Methicillin resistance mecA/C DETECTED (A) NOT DETECTED    Thadeus Gandolfi A 12/12/2021  9:31 AM

## 2021-12-12 NOTE — TOC Initial Note (Signed)
Transition of Care [facility]) - Initial/Assessment Note    Patient Details  Name: Jeff Wilson MRN: [id number] Date of Birth: [birth year]  Transition of Care [facility]) CM/SW Contact:    Vassie Moselle, LCSW Phone Number: 12/12/2021, 10:11 AM  Clinical Narrative:                 CSW met with pt and obtained verbal consent to contact sister. Pt was alert and oriented to self and place. Pt is unsure of the name of facility he has been staying at prior to coming to the hospital.  CSW contacted pt's sister who shared that this pt has been living at [facility] for the past two years. CSW contacted [facility] to confirm and ensure pt is able to return once medically stable.   Expected Discharge Plan: Long Term Nursing Home Barriers to Discharge: No Barriers Identified   Patient Goals and CMS Choice Patient states their goals for this hospitalization and ongoing recovery are:: To return to [place] offered to / list presented to : Patient  Expected Discharge Plan and Services Expected Discharge Plan: Long Term Nursing Home In-house Referral: Clinical Social Work Discharge Planning Services: CM Consult Post Acute Care Choice: Nursing Home Living arrangements for the past 2 months: [place]                 DME Arranged: N/A DME Agency: NA                  Prior Living Arrangements/Services Living arrangements for the past 2 months: [place] Lives with:: Facility Resident Patient language and need for interpreter reviewed:: Yes Do you feel safe going back to the place where you live?: Yes      Need for Family Participation in Patient Care: Yes (Comment) Care giver support system in place?: Yes (comment)   Criminal Activity/Legal Involvement Pertinent to Current Situation/Hospitalization: No - Comment as needed  Activities of Daily Living Home Assistive Devices/Equipment: Wheelchair ADL Screening (condition at time of  admission) Patient's cognitive ability adequate to safely complete daily activities?: No Is the patient deaf or have difficulty hearing?: No Does the patient have difficulty seeing, even when wearing glasses/contacts?: Yes Does the patient have difficulty concentrating, remembering, or making decisions?: Yes Patient able to express need for assistance with ADLs?: Yes Does the patient have difficulty dressing or bathing?: Yes Independently performs ADLs?: No Communication: Independent Dressing (OT): Needs assistance Is this a change from baseline?: Pre-admission baseline Grooming: Needs assistance Is this a change from baseline?: Pre-admission baseline Feeding: Independent Bathing: Needs assistance Is this a change from baseline?: Pre-admission baseline Toileting: Needs assistance Is this a change from baseline?: Pre-admission baseline In/Out Bed: Needs assistance Is this a change from baseline?: Pre-admission baseline Walks in Home: Dependent Is this a change from baseline?: Pre-admission baseline Does the patient have difficulty walking or climbing stairs?: Yes Weakness of Legs: None Weakness of Arms/Hands: None  Permission Sought/Granted Permission sought to share information with : Facility Sport and exercise psychologist, Family Supports Permission granted to share information with : Yes, Verbal Permission Granted  Share Information with NAME: Andi Devon     Permission granted to share info w Relationship: Sister  Permission granted to share info w Contact Information: [phone]  Emotional Assessment Appearance:: Appears older than stated age Attitude/Demeanor/Rapport: Other (comment) (confused) Affect (typically observed): Accepting, Pleasant Orientation: : Oriented to Self, Oriented to Place Alcohol / Substance Use: Not Applicable Psych Involvement:  No (comment)  Admission diagnosis:  Hypokalemia [E87.6] Generalized abdominal pain [R10.84] SIRS (systemic inflammatory  response syndrome) (HCC) [R65.10] Constipation [K59.00] Patient Active Problem List   Diagnosis Date Noted   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   Mixed hyperlipidemia 12/11/2021   Dementia without behavioral disturbance ([place]) 12/11/2021   Lactic acidosis 12/11/2021   Cerebrovascular disease 12/11/2021   Hypokalemia 12/11/2021   Impaired mobility and ADLs 06/19/2020   Acute ischemic stroke ([place]) 04/01/2019   Ambulatory dysfunction 04/01/2019   TIA (transient ischemic attack) 03/28/2019   Essential hypertension 01/28/2019   Fall at home, initial encounter 01/28/2019   History of CVA (cerebrovascular accident) 01/28/2019   Non compliance w medication regimen 01/28/2019   Weakness of left lower extremity 01/28/2019   PCP:  Raymondo Band, MD Pharmacy:   [facility], [place] - [address] [place] [address] [place] [address] Phone: [phone] Fax: [phone]     Social Determinants of Health (SDOH) Interventions    Readmission Risk Interventions     No data to display

## 2021-12-13 DIAGNOSIS — K567 Ileus, unspecified: Secondary | ICD-10-CM | POA: Diagnosis present

## 2021-12-13 DIAGNOSIS — Z87891 Personal history of nicotine dependence: Secondary | ICD-10-CM | POA: Diagnosis not present

## 2021-12-13 DIAGNOSIS — R7881 Bacteremia: Secondary | ICD-10-CM

## 2021-12-13 DIAGNOSIS — Z8673 Personal history of transient ischemic attack (TIA), and cerebral infarction without residual deficits: Secondary | ICD-10-CM | POA: Diagnosis not present

## 2021-12-13 DIAGNOSIS — Z79899 Other long term (current) drug therapy: Secondary | ICD-10-CM | POA: Diagnosis not present

## 2021-12-13 DIAGNOSIS — I700 Atherosclerosis of aorta: Secondary | ICD-10-CM | POA: Diagnosis present

## 2021-12-13 DIAGNOSIS — R4189 Other symptoms and signs involving cognitive functions and awareness: Secondary | ICD-10-CM | POA: Diagnosis present

## 2021-12-13 DIAGNOSIS — E876 Hypokalemia: Secondary | ICD-10-CM | POA: Diagnosis present

## 2021-12-13 DIAGNOSIS — I442 Atrioventricular block, complete: Secondary | ICD-10-CM | POA: Diagnosis present

## 2021-12-13 DIAGNOSIS — B957 Other staphylococcus as the cause of diseases classified elsewhere: Secondary | ICD-10-CM | POA: Diagnosis present

## 2021-12-13 DIAGNOSIS — K5641 Fecal impaction: Secondary | ICD-10-CM | POA: Diagnosis present

## 2021-12-13 DIAGNOSIS — E782 Mixed hyperlipidemia: Secondary | ICD-10-CM | POA: Diagnosis present

## 2021-12-13 DIAGNOSIS — I10 Essential (primary) hypertension: Secondary | ICD-10-CM | POA: Diagnosis present

## 2021-12-13 DIAGNOSIS — E872 Acidosis, unspecified: Secondary | ICD-10-CM | POA: Diagnosis present

## 2021-12-13 DIAGNOSIS — Z7902 Long term (current) use of antithrombotics/antiplatelets: Secondary | ICD-10-CM | POA: Diagnosis not present

## 2021-12-13 DIAGNOSIS — K5289 Other specified noninfective gastroenteritis and colitis: Secondary | ICD-10-CM | POA: Diagnosis present

## 2021-12-13 DIAGNOSIS — F0390 Unspecified dementia without behavioral disturbance: Secondary | ICD-10-CM | POA: Diagnosis present

## 2021-12-13 DIAGNOSIS — E869 Volume depletion, unspecified: Secondary | ICD-10-CM | POA: Diagnosis present

## 2021-12-13 DIAGNOSIS — I679 Cerebrovascular disease, unspecified: Secondary | ICD-10-CM | POA: Diagnosis present

## 2021-12-13 LAB — BASIC METABOLIC PANEL
Anion gap: 3 — ABNORMAL LOW (ref 5–15)
BUN: 16 mg/dL (ref 8–23)
CO2: 24 mmol/L (ref 22–32)
Calcium: 8.7 mg/dL — ABNORMAL LOW (ref 8.9–10.3)
Chloride: 116 mmol/L — ABNORMAL HIGH (ref 98–111)
Creatinine, Ser: 1.02 mg/dL (ref 0.61–1.24)
GFR, Estimated: >60 mL/min (ref >60)
Glucose, Bld: 98 mg/dL (ref 70–99)
Potassium: 3.8 mmol/L (ref 3.5–5.1)
Sodium: 143 mmol/L (ref 135–145)

## 2021-12-13 MED ORDER — SODIUM CHLORIDE 0.9 % IV SOLN: INTRAVENOUS | Status: DC

## 2021-12-13 MED ORDER — ADULT MULTIVITAMIN W/MINERALS CH
1.0000 | ORAL_TABLET | Freq: Every day | ORAL | Status: DC
  Administered 2021-12-13 – 2021-12-17 (×5): 1 via ORAL
  Filled 2021-12-13 (×5): qty 1

## 2021-12-13 MED ORDER — POTASSIUM CHLORIDE CRYS ER 20 MEQ PO TBCR
40.0000 meq | EXTENDED_RELEASE_TABLET | ORAL | Status: AC
  Administered 2021-12-13 (×2): 40 meq via ORAL
  Filled 2021-12-13 (×2): qty 2

## 2021-12-13 MED ORDER — BOOST / RESOURCE BREEZE PO LIQD CUSTOM
1.0000 | Freq: Three times a day (TID) | ORAL | Status: DC
  Administered 2021-12-13 – 2021-12-17 (×10): 1 via ORAL

## 2021-12-13 NOTE — Progress Notes (Signed)
Initial Nutrition Assessment  DOCUMENTATION CODES:   Not applicable  INTERVENTION:  Discontinue Ensure Enlive Boost Breeze po TID, each supplement provides 250 kcal and 9 grams of protein MVI with minerals daily  NUTRITION DIAGNOSIS:   Increased nutrient needs related to acute illness as evidenced by estimated needs.  GOAL:   Patient will meet greater than or equal to 90% of their needs  MONITOR:   PO intake, Supplement acceptance, Diet advancement, Labs, Weight trends, I & O's  REASON FOR ASSESSMENT:   Malnutrition Screening Tool    ASSESSMENT:   Pt admitted with abdominal pain and constipation, found to have stercoral colitis. PMH significant for dementia and TIA.  Pt noted to have significant improvement in constipation with fleet enema. However could not r/o ileus. He was noted to have been tolerating a regular diet yesterday. Today has been downgraded to clear liquid diet.   Pt sitting up in bed at time of visit. He is a limited historian. No family present at bedside to obtain more detailed history from. Reports feeling hungry today and was asking for a hamburger. Denies current abdominal pain.   Meal completions: 08/15: 0%-lunch,  100%-dinner 08/16: 100%-breakfast, 25%-lunch, 85%-dinner  Per MST screening in flowsheets, pt reported a 50 lbs weight loss within the last year. Unfortunately, there is limited documentation of weight history on file to review. Current wt noted to be 91.6 kg. This appears to be elevated from documented weight in 2021 of 72.6 kg. Will continue to monitor throughout admission.   Given limited nutrition related history and weight history, unable to diagnose malnutrition at this time. Patient is at high risk for malnutrition given advanced age and dementia. Will re-assess as able to obtain more detailed information.   Edema: mild pitting BLE  Medications: melatonin, remeron, miralax, klor-con, senna IV drips: NaCl @50ml /hr, abx  Labs  reviewed  UOP: 1.5L x24 hours I/O's: +1433ml since admission  NUTRITION - FOCUSED PHYSICAL EXAM:  Flowsheet Row Most Recent Value  Orbital Region Mild depletion  Upper Arm Region No depletion  Thoracic and Lumbar Region No depletion  Buccal Region Moderate depletion  Temple Region Mild depletion  Clavicle Bone Region Mild depletion  Clavicle and Acromion Bone Region No depletion  Scapular Bone Region No depletion  Dorsal Hand Mild depletion  Patellar Region No depletion  Anterior Thigh Region No depletion  Posterior Calf Region No depletion  Edema (RD Assessment) Mild  [BLE]  Hair Reviewed  Eyes Reviewed  Mouth Reviewed  Skin Reviewed  Nails Reviewed       Diet Order:   Diet Order             Diet clear liquid Room service appropriate? Yes; Fluid consistency: Thin  Diet effective now                   EDUCATION NEEDS:   No education needs have been identified at this time  Skin:  Skin Assessment: Reviewed RN Assessment  Last BM:  8/16 (type 7 x3)  Height:   Ht Readings from Last 1 Encounters:  12/10/21 5\' 9"  (1.753 m)    Weight:   Wt Readings from Last 1 Encounters:  12/10/21 91.6 kg   BMI:  Body mass index is 29.83 kg/m.  Estimated Nutritional Needs:   Kcal:  2100-2300  Protein:  105-120g  Fluid:  >/=2.1L  , RDN, LDN Clinical Nutrition

## 2021-12-13 NOTE — Progress Notes (Signed)
  Progress Note   Patient: Jeff Wilson XIP:[id number] DOB: 17-Apr-1941 DOA: 12/10/2021     0 DOS: the patient was seen and examined on 12/13/2021   Brief hospital course: 81 year old man PMH including dementia, TIA presenting with abdominal pain, constipation.  Noted to have lactic acidosis and mild leukocytosis.  CT abdomen and pelvis demonstrated distended colon with large amount of retained stool, perirectal fat stranding concerning for fecal impaction versus stercoral colitis.  Disimpaction with modest results but responded well to Fleet enema.  Admitted for stercoral colitis/fecal impaction.  Started on aggressive bowel regimen.  Had 3 good sized bowel movements.  Plain radiograph showed no free air, now has ileus.  Also with bacteremia, initially thought to be contaminant but now in 2 bottles.  Assessment and Plan: Stercoral colitis --Overall slowly improving.  Bowels moving.  Still has some abdominal tenderness secondary to ileus.    Ileus.   --Repeat abdominal x-ray today demonstrated ileus.  Replete potassium.  Keep magnesium at goal. --IVF  Gram-positive bacteremia, initially thought to be contaminant, but now present in 2 bottles. --Continue vancomycin, await culture results.   Lactic acidosis --Secondary to volume depletion.  Sepsis ruled out.    Hypokalemia -- Replete.  Magnesium within normal limits.  Recheck in AM.    Essential hypertension --Stable off antihypertensives.    Dementia without behavioral disturbance (HCC) --Longstanding known history of dementia and cognitive deficit --Stable.  Return to [facility]-term care.   Aortic atherosclerosis. -- No treatment indicated.     Subjective:  Feels ok Some abd pain Noted to be less talkative per nursing  Physical Exam: Vitals:   12/12/21 1557 12/12/21 2012 12/13/21 0455 12/13/21 1025  BP: 139/61 113/68 121/68 133/70  Pulse: 95 75 69 61  Resp: 16 14 16 16   Temp: 98.3 F (36.8 C) 98.7 F (37.1 C)  97.7 F (36.5 C) 97.8 F (36.6 C)  TempSrc: Oral Oral Oral Oral  SpO2: 98% 99% 99% 100%  Weight:      Height:       Physical Exam Vitals reviewed.  Constitutional:      General: He is not in acute distress.    Appearance: He is not ill-appearing or toxic-appearing.  Cardiovascular:     Rate and Rhythm: Normal rate and regular rhythm.     Heart sounds: No murmur heard. Pulmonary:     Effort: Pulmonary effort is normal. No respiratory distress.     Breath sounds: No wheezing or rales.  Abdominal:     General: There is distension.     Tenderness: There is abdominal tenderness (generalized, mild).  Musculoskeletal:     Right lower leg: No edema.     Left lower leg: No edema.  Neurological:     Mental Status: He is alert.  Psychiatric:        Mood and Affect: Mood normal.        Behavior: Behavior normal.     Data Reviewed:  K+ 3.8 Family Communication: None  Disposition: Status is: Observation Changed to inpatient given bacteremia and ileus.   Planned Discharge Destination: Skilled nursing facility    Time spent: 35 minutes  Author: , MD 12/13/2021 4:10 PM  For on call review [email or web address].

## 2021-12-13 NOTE — TOC Progression Note (Signed)
Transition of Care [facility]) - Progression Note    Patient Details  Name: Jeff Wilson MRN: [id number] Date of Birth: 1940/06/21  Transition of [facility]) CM/SW Contact  Otelia Santee, LCSW Phone Number: 12/13/2021, 11:18 AM  Clinical Narrative:    Insurance authorization for SNF at [facility] has been requested as pt may potentially discharge as early as 12/14/21.    Expected Discharge Plan: Long Term Nursing Home Barriers to Discharge: No Barriers Identified  Expected Discharge Plan and Services Expected Discharge Plan: Long Term Nursing Home In-house Referral: Clinical Social Work Discharge Planning Services: CM Consult Post Acute Care Choice: Nursing Home Living arrangements for the past 2 months: Skilled Nursing Facility                 DME Arranged: N/A DME Agency: NA                   Social Determinants of Health (SDOH) Interventions    Readmission Risk Interventions     No data to display

## 2021-12-14 DIAGNOSIS — K567 Ileus, unspecified: Secondary | ICD-10-CM | POA: Diagnosis not present

## 2021-12-14 DIAGNOSIS — F0390 Unspecified dementia without behavioral disturbance: Secondary | ICD-10-CM | POA: Diagnosis not present

## 2021-12-14 DIAGNOSIS — R7881 Bacteremia: Secondary | ICD-10-CM | POA: Diagnosis not present

## 2021-12-14 DIAGNOSIS — K5289 Other specified noninfective gastroenteritis and colitis: Secondary | ICD-10-CM | POA: Diagnosis not present

## 2021-12-14 LAB — BASIC METABOLIC PANEL
Anion gap: 3 — ABNORMAL LOW (ref 5–15)
BUN: 12 mg/dL (ref 8–23)
CO2: 24 mmol/L (ref 22–32)
Calcium: 8.9 mg/dL (ref 8.9–10.3)
Chloride: 117 mmol/L — ABNORMAL HIGH (ref 98–111)
Creatinine, Ser: 0.81 mg/dL (ref 0.61–1.24)
GFR, Estimated: >60 mL/min (ref >60)
Glucose, Bld: 93 mg/dL (ref 70–99)
Potassium: 3.9 mmol/L (ref 3.5–5.1)
Sodium: 144 mmol/L (ref 135–145)

## 2021-12-14 LAB — MAGNESIUM: Magnesium: 1.9 mg/dL (ref 1.7–2.4)

## 2021-12-14 MED ORDER — POTASSIUM CHLORIDE CRYS ER 20 MEQ PO TBCR
40.0000 meq | EXTENDED_RELEASE_TABLET | ORAL | Status: AC
  Administered 2021-12-14 (×2): 40 meq via ORAL
  Filled 2021-12-14 (×2): qty 2

## 2021-12-14 MED ORDER — MAGNESIUM SULFATE 2 GM/50ML IV SOLN
2.0000 g | Freq: Once | INTRAVENOUS | Status: AC
  Administered 2021-12-14: 2 g via INTRAVENOUS
  Filled 2021-12-14: qty 50

## 2021-12-14 NOTE — Plan of Care (Signed)

## 2021-12-14 NOTE — Progress Notes (Signed)
Physical Therapy Treatment Patient Details Name: Jeff Wilson MRN: [id number] DOB: February 16, 1941 Today's Date: 12/14/2021   History of Present Illness Patient is 81 y.o. male  presents to [facility] emergency department via EMS with complaints of abdominal pain. PMH significant of dementia, TIA, hypertension hyperlipidemia.    PT Comments    Patient declining to mobilize to recliner , assisted to reposition in bed in order to have is liquids. Continuee PT , unsure of PLOF, from LTC SNF per MSW.    Recommendations for follow up therapy are one component of a multi-disciplinary discharge planning process, led by the attending physician.  Recommendations may be updated based on patient status, additional functional criteria and insurance authorization.  Follow Up Recommendations  Long-term institutional care without follow-up therapy Can patient physically be transported by private vehicle: No   Assistance Recommended at Discharge Frequent or constant Supervision/Assistance  Patient can return home with the following Two people to help with walking and/or transfers;A lot of help with bathing/dressing/bathroom;Assistance with cooking/housework;Assistance with feeding;Direct supervision/assist for medications management;Direct supervision/assist for financial management;Assist for transportation;Help with stairs or ramp for entrance   Equipment Recommendations       Recommendations for Other Services       Precautions / Restrictions Precautions Precautions: Fall     Mobility  Bed Mobility               General bed mobility comments: attempted to assist to sitting, patient declined and stated he did not want to get up. + 2 to reposition in bed for  breakfast.    Transfers                        Ambulation/Gait                   Stairs             Wheelchair Mobility    Modified Rankin (Stroke Patients Only)       Balance                                             Cognition Arousal/Alertness: Awake/alert Behavior During Therapy: WFL for tasks assessed/performed Overall Cognitive Status: No family/caregiver present to determine baseline cognitive functioning                                 General Comments: pt has history of dementia, no family present.        Exercises      General Comments General comments (skin integrity, edema, etc.): NT      Pertinent Vitals/Pain Pain Assessment Pain Assessment: No/denies pain    Home Living                          Prior Function            PT Goals (current goals can now be found in the care plan section) Progress towards PT goals: Not progressing toward goals - comment (pt declining mobility)    Frequency    Min 2X/week      PT Plan Current plan remains appropriate    Co-evaluation              AM-PAC PT "6 Clicks" Mobility  Outcome Measure  Help needed turning from your back to your side while in a flat bed without using bedrails?: Total Help needed moving from lying on your back to sitting on the side of a flat bed without using bedrails?: Total Help needed moving to and from a bed to a chair (including a wheelchair)?: Total Help needed standing up from a chair using your arms (e.g., wheelchair or bedside chair)?: Total Help needed to walk in hospital room?: Total Help needed climbing 3-5 steps with a railing? : Total 6 Click Score: 6    End of Session   Activity Tolerance: Other (comment) (pt. self limiting) Patient left: in bed;with bed alarm set Nurse Communication: Mobility status PT Visit Diagnosis: Unsteadiness on feet (R26.81);Other abnormalities of gait and mobility (R26.89);Muscle weakness (generalized) (M62.81);Difficulty in walking, not elsewhere classified (R26.2)     Time: 8309-4076 PT Time Calculation (min) (ACUTE ONLY): 10 min  Charges:  $Therapeutic Activity: 8-22  mins                     Jeff Wilson PT Acute Rehabilitation Services Office [phone] Weekend pager-[phone]    Rada Hay 12/14/2021, 11:37 AM

## 2021-12-14 NOTE — Progress Notes (Signed)
  Progress Note   Patient: Jeff Wilson IEP:[id number] DOB: [birth year] DOA: 12/10/2021     1 DOS: the patient was seen and examined on 12/14/2021   Brief hospital course: 81 year old man PMH including dementia, TIA presenting with abdominal pain, constipation.  Noted to have lactic acidosis and mild leukocytosis.  CT abdomen and pelvis demonstrated distended colon with large amount of retained stool, perirectal fat stranding concerning for fecal impaction versus stercoral colitis.  Disimpaction with modest results but responded well to Fleet enema.  Admitted for stercoral colitis/fecal impaction.  Started on aggressive bowel regimen.  Had 3 good sized bowel movements.  Plain radiograph showed no free air, now has ileus.  Also with bacteremia, initially thought to be contaminant but now in 2 bottles.  Assessment and Plan: Stercoral colitis -- Resolved bowels moving.  Abdominal tenderness nearly resolved now.  Ileus.   --Repeat abdominal x-ray demonstrated ileus.   -- Clinically resolved.  Bowels moving.  Tolerating diet.  Potassium and magnesium at goal.   Gram-positive bacteremia, initially thought to be contaminant, but now present in 2 bottles. --Continue vancomycin, await culture results.  Still think contaminant most likely.   Lactic acidosis --Secondary to volume depletion.  Sepsis ruled out.    Hypokalemia -- Repleted.  Magnesium within normal limits.      Essential hypertension --Stable off antihypertensives.    Dementia without behavioral disturbance (HCC) --Longstanding known history of dementia and cognitive deficit --Stable.  Return to [facility] long-term care.   Aortic atherosclerosis. -- No treatment indicated.     Subjective:  Feels better No pain Would like solid food No n/v  Physical Exam: Vitals:   12/13/21 1025 12/13/21 1942 12/14/21 0545 12/14/21 1404  BP: 133/70 130/70 135/77 133/74  Pulse: 61 76 66 72  Resp: 16 16 20 19   Temp: 97.8 F (36.6 C)  98.1 F (36.7 C) 98.7 F (37.1 C) 98 F (36.7 C)  TempSrc: Oral Oral Oral Oral  SpO2: 100% 99% 100% 100%  Weight:      Height:       Physical Exam Vitals reviewed.  Constitutional:      General: He is not in acute distress.    Appearance: He is not ill-appearing or toxic-appearing.  Cardiovascular:     Rate and Rhythm: Normal rate and regular rhythm.     Heart sounds: No murmur heard. Pulmonary:     Effort: Pulmonary effort is normal. No respiratory distress.     Breath sounds: No wheezing, rhonchi or rales.  Abdominal:     General: Bowel sounds are normal. There is distension (decreased).     Tenderness: There is no abdominal tenderness.  Musculoskeletal:     Right lower leg: No edema.     Left lower leg: No edema.  Neurological:     Mental Status: He is alert.  Psychiatric:        Mood and Affect: Mood normal.        Behavior: Behavior normal.     Data Reviewed:  BM x1 UOP 1200 Creatinine WNL K+ 3.9 Mg 1.9  Family Communication: none  Disposition: Status is: Inpatient Remains inpatient appropriate because: bacteremia, recovering ileus  Planned Discharge Destination:  LTC    Time spent: 25 minutes  Author: , MD 12/14/2021 6:35 PM  For on call review [email or web address].

## 2021-12-15 DIAGNOSIS — K567 Ileus, unspecified: Secondary | ICD-10-CM | POA: Diagnosis not present

## 2021-12-15 DIAGNOSIS — R7881 Bacteremia: Secondary | ICD-10-CM | POA: Diagnosis not present

## 2021-12-15 DIAGNOSIS — K5289 Other specified noninfective gastroenteritis and colitis: Secondary | ICD-10-CM | POA: Diagnosis not present

## 2021-12-15 DIAGNOSIS — F0390 Unspecified dementia without behavioral disturbance: Secondary | ICD-10-CM | POA: Diagnosis not present

## 2021-12-15 LAB — CULTURE, BLOOD (ROUTINE X 2): Special Requests: ADEQUATE

## 2021-12-15 LAB — CREATININE, SERUM
Creatinine, Ser: 0.77 mg/dL (ref 0.61–1.24)
GFR, Estimated: >60 mL/min (ref >60)

## 2021-12-15 NOTE — Plan of Care (Signed)
  Problem: Clinical Measurements: Goal: Will remain free from infection Outcome: Progressing   Problem: Clinical Measurements: Goal: Diagnostic test results will improve Outcome: Progressing   Problem: Activity: Goal: Risk for activity intolerance will decrease Outcome: Progressing   Problem: Nutrition: Goal: Adequate nutrition will be maintained Outcome: Progressing   Problem: Pain Managment: Goal: General experience of comfort will improve Outcome: Progressing   

## 2021-12-15 NOTE — Progress Notes (Signed)
Progress Note   Patient: Jeff Wilson ZOX:[id number] DOB: 1940-08-29 DOA: 12/10/2021     2 DOS: the patient was seen and examined on 12/15/2021   Brief hospital course: 81 year old man PMH including dementia, TIA presenting with abdominal pain, constipation.  Noted to have lactic acidosis and mild leukocytosis.  CT abdomen and pelvis demonstrated distended colon with large amount of retained stool, perirectal fat stranding concerning for fecal impaction versus stercoral colitis.  Disimpaction with modest results but responded well to Fleet enema.  Admitted for stercoral colitis/fecal impaction.  Started on aggressive bowel regimen.  Had 3 good sized bowel movements.  Plain radiograph showed no free air, now has ileus.  Also with bacteremia, initially thought to be contaminant but now in 2 bottles.  Ileus now resolving.  Discussed with ID, will go ahead and treat for total 5 days for bacteremia given presence in 2 sets.  Repeat blood cultures pending.  Likely discharge 1 to 2 days.  Assessment and Plan: Stercoral colitis -- Appears to be resolved at this time.   Ileus.   --Repeat abdominal x-ray demonstrated ileus.   -- Clinically resolving.  Minimal abdominal distention.  Bowels moving.  Tolerating diet.  Electrolytes stable.   Gram-positive bacteremia, initially thought to be contaminant, but now present in 2 bottles. -- With infectious disease, given presence in 2 bottles we will treat total 5 days IV.   Lactic acidosis --Secondary to volume depletion.  Sepsis ruled out.    Hypokalemia -- Repleted.  Magnesium within normal limits.      Essential hypertension --Stable off antihypertensives.    Dementia without behavioral disturbance (HCC) --Longstanding known history of dementia and cognitive deficit --Stable.  Return to [facility] long-term care.   Aortic atherosclerosis. -- No treatment indicated.      Subjective:  Feels ok  Physical Exam: Vitals:   12/14/21 1404  12/14/21 2006 12/15/21 0625 12/15/21 1320  BP: 133/74 (!) 143/82 (!) 144/94 (!) 129/90  Pulse: 72 69 71 94  Resp: 19 16 18 20   Temp: 98 F (36.7 C) 98 F (36.7 C) (!) 97.5 F (36.4 C) 98.4 F (36.9 C)  TempSrc: Oral Oral Oral Oral  SpO2: 100% 100% 100% 97%  Weight:      Height:       Physical Exam Vitals reviewed.  Constitutional:      General: He is not in acute distress.    Appearance: He is not ill-appearing or toxic-appearing.  Cardiovascular:     Rate and Rhythm: Normal rate and regular rhythm.     Heart sounds: No murmur heard. Pulmonary:     Effort: Pulmonary effort is normal. No respiratory distress.     Breath sounds: No wheezing, rhonchi or rales.  Abdominal:     General: There is distension.     Palpations: Abdomen is soft.     Tenderness: There is abdominal tenderness (mild tenderness).  Musculoskeletal:     Right lower leg: No edema.     Left lower leg: No edema.     Comments: Moves both feet to command  Neurological:     Mental Status: He is alert.  Psychiatric:        Mood and Affect: Mood normal.     Data Reviewed:  Creatinine WNL BC pending  Family Communication: none  Disposition: Status is: Inpatient Remains inpatient appropriate because: IV abx  Planned Discharge Destination: Skilled nursing facility    Time spent: 20 minutes  Author: , MD 12/15/2021 6:17 PM  For on call review [email or web address].

## 2021-12-15 NOTE — Plan of Care (Signed)

## 2021-12-16 ENCOUNTER — Inpatient Hospital Stay (HOSPITAL_COMMUNITY): Payer: Medicare HMO

## 2021-12-16 DIAGNOSIS — K567 Ileus, unspecified: Secondary | ICD-10-CM | POA: Diagnosis not present

## 2021-12-16 DIAGNOSIS — K5289 Other specified noninfective gastroenteritis and colitis: Secondary | ICD-10-CM | POA: Diagnosis not present

## 2021-12-16 DIAGNOSIS — R7881 Bacteremia: Secondary | ICD-10-CM | POA: Diagnosis not present

## 2021-12-16 DIAGNOSIS — F0390 Unspecified dementia without behavioral disturbance: Secondary | ICD-10-CM | POA: Diagnosis not present

## 2021-12-16 LAB — BASIC METABOLIC PANEL
Anion gap: 5 (ref 5–15)
BUN: 21 mg/dL (ref 8–23)
CO2: 22 mmol/L (ref 22–32)
Calcium: 8.5 mg/dL — ABNORMAL LOW (ref 8.9–10.3)
Chloride: 111 mmol/L (ref 98–111)
Creatinine, Ser: 0.89 mg/dL (ref 0.61–1.24)
GFR, Estimated: >60 mL/min (ref >60)
Glucose, Bld: 115 mg/dL — ABNORMAL HIGH (ref 70–99)
Potassium: 3 mmol/L — ABNORMAL LOW (ref 3.5–5.1)
Sodium: 138 mmol/L (ref 135–145)

## 2021-12-16 LAB — CULTURE, BLOOD (ROUTINE X 2)

## 2021-12-16 LAB — MAGNESIUM: Magnesium: 2 mg/dL (ref 1.7–2.4)

## 2021-12-16 LAB — PHOSPHORUS: Phosphorus: 3.5 mg/dL (ref 2.5–4.6)

## 2021-12-16 MED ORDER — MAGNESIUM OXIDE -MG SUPPLEMENT 400 (240 MG) MG PO TABS
400.0000 mg | ORAL_TABLET | Freq: Every day | ORAL | Status: DC
  Administered 2021-12-16 – 2021-12-17 (×2): 400 mg via ORAL
  Filled 2021-12-16 (×2): qty 1

## 2021-12-16 MED ORDER — POTASSIUM CHLORIDE CRYS ER 20 MEQ PO TBCR
40.0000 meq | EXTENDED_RELEASE_TABLET | ORAL | Status: AC
  Administered 2021-12-16 (×2): 40 meq via ORAL
  Filled 2021-12-16 (×2): qty 2

## 2021-12-16 NOTE — Progress Notes (Signed)
  Progress Note   Patient: Jeff Wilson KCL:[id number] DOB: 17-Aug-1940 DOA: 12/10/2021     3 DOS: the patient was seen and examined on 12/16/2021   Brief hospital course: 81 year old man PMH including dementia, TIA presenting with abdominal pain, constipation.  Noted to have lactic acidosis and mild leukocytosis.  CT abdomen and pelvis demonstrated distended colon with large amount of retained stool, perirectal fat stranding concerning for fecal impaction versus stercoral colitis.  Disimpaction with modest results but responded well to Fleet enema.  Admitted for stercoral colitis/fecal impaction.  Started on aggressive bowel regimen.  Had 3 good sized bowel movements.  Plain radiograph showed no free air, now has ileus.  Also with bacteremia, initially thought to be contaminant but now in 2 bottles.  Ileus now resolving.  Discussed with ID, will go ahead and treat for total 5 days for bacteremia given presence in 2 sets.  Repeat blood cultures pending.  Likely discharge 1 to 2 days.  Assessment and Plan: Stercoral colitis -- Resolved.   Ileus.   --Repeat abdominal x-ray demonstrates improving ileus.  This correlates clinically as well. Bowels moving.  Tolerating diet.  Replace potassium.   Gram-positive bacteremia, initially thought to be contaminant, but now present in 2 bottles. --Continue vancomycin, discussed with ID, will treat total 5 days.  Cannot rule out pathogenic.   Lactic acidosis --Secondary to volume depletion.  Sepsis ruled out.    Hypokalemia -- Replete.    Essential hypertension --Stable off antihypertensives.    Dementia without behavioral disturbance (HCC) --Longstanding known history of dementia and cognitive deficit --Stable.  Return to [facility] long-term care.   Aortic atherosclerosis. -- No treatment indicated.  We will improving.  Anticipate discharge tomorrow.     Subjective:  Feels ok No vomiting  Physical Exam: Vitals:   12/15/21 0625 12/15/21  1320 12/15/21 2105 12/16/21 0516  BP: (!) 144/94 (!) 129/90 131/72 103/67  Pulse: 71 94 72 75  Resp: 18 20 18 16   Temp: (!) 97.5 F (36.4 C) 98.4 F (36.9 C) 98.3 F (36.8 C) 97.8 F (36.6 C)  TempSrc: Oral Oral Oral Oral  SpO2: 100% 97% 100% 98%  Weight:      Height:       Physical Exam Vitals reviewed.  Constitutional:      General: He is not in acute distress.    Appearance: He is not ill-appearing or toxic-appearing.  Cardiovascular:     Rate and Rhythm: Normal rate and regular rhythm.     Heart sounds: No murmur heard. Pulmonary:     Effort: Pulmonary effort is normal. No respiratory distress.     Breath sounds: No wheezing, rhonchi or rales.  Abdominal:     General: There is distension.     Tenderness: There is abdominal tenderness (mild generalized).  Neurological:     Mental Status: He is alert.  Psychiatric:        Mood and Affect: Mood normal.        Behavior: Behavior normal.     Data Reviewed:  PO 592 BMx1 K+ 3.0 Mg WNL Phos WNL  Family Communication: none  Disposition: Status is: Inpatient Remains inpatient appropriate because: IV abx  Planned Discharge Destination: Skilled nursing facility    Time spent: 25 minutes  Author: , MD 12/16/2021 12:31 PM  For on call review [email or web address].

## 2021-12-17 DIAGNOSIS — K5289 Other specified noninfective gastroenteritis and colitis: Secondary | ICD-10-CM | POA: Diagnosis not present

## 2021-12-17 DIAGNOSIS — K567 Ileus, unspecified: Secondary | ICD-10-CM | POA: Diagnosis not present

## 2021-12-17 DIAGNOSIS — R7881 Bacteremia: Secondary | ICD-10-CM | POA: Diagnosis not present

## 2021-12-17 DIAGNOSIS — F0390 Unspecified dementia without behavioral disturbance: Secondary | ICD-10-CM | POA: Diagnosis not present

## 2021-12-17 LAB — BASIC METABOLIC PANEL
Anion gap: 4 — ABNORMAL LOW (ref 5–15)
BUN: 22 mg/dL (ref 8–23)
CO2: 20 mmol/L — ABNORMAL LOW (ref 22–32)
Calcium: 8.8 mg/dL — ABNORMAL LOW (ref 8.9–10.3)
Chloride: 116 mmol/L — ABNORMAL HIGH (ref 98–111)
Creatinine, Ser: 0.89 mg/dL (ref 0.61–1.24)
GFR, Estimated: >60 mL/min (ref >60)
Glucose, Bld: 87 mg/dL (ref 70–99)
Potassium: 4.1 mmol/L (ref 3.5–5.1)
Sodium: 140 mmol/L (ref 135–145)

## 2021-12-17 MED ORDER — ADULT MULTIVITAMIN W/MINERALS CH: 1.0000 | ORAL_TABLET | Freq: Every day | ORAL | Status: DC

## 2021-12-17 NOTE — TOC Transition Note (Signed)
Transition of Care [facility]) - CM/SW Discharge Note   Patient Details  Name: Jeff Wilson MRN: [id number] Date of Birth: 12/24/1940  Transition of Care [facility]) CM/SW Contact:  Otelia Santee, LCSW Phone Number: 12/17/2021, 12:12 PM   Clinical Narrative:    Pt is to return to LTC at [facility]. Pt will be going to room 403B. RN to call report to [phone]. CSW spoke with pt's sister and informed her of pt's discharge. Pt will be transported to facility by [facility].    Final next level of care: Long Term Nursing Home Barriers to Discharge: No Barriers Identified   Patient Goals and CMS Choice Patient states their goals for this hospitalization and ongoing recovery are:: To return to [facility] LTC   Choice offered to / list presented to : Patient  Discharge Placement              Patient chooses bed at: [facility] Patient to be transferred to facility by: [facility] Name of family member notified: [name] Patient and family notified of of transfer: 12/17/21  Discharge Plan and Services In-house Referral: Clinical Social Work Discharge Planning Services: CM Consult Post Acute Care Choice: Nursing Home          DME Arranged: N/A DME Agency: NA                  Social Determinants of Health (SDOH) Interventions     Readmission Risk Interventions    12/17/2021   12:10 PM  Readmission Risk Prevention Plan  Transportation Screening Complete  PCP or Specialist Appt within 5-7 Days Complete  Home Care Screening Complete  Medication Review (RN CM) Complete

## 2021-12-17 NOTE — Discharge Summary (Signed)
Physician Discharge Summary   Patient: Jeff Wilson Chaudhari MRN: [id number] DOB: 08-17-1940  Admit date:     12/10/2021  Discharge date: 12/17/21  Discharge Physician: Brendia Sacksaniel Dmarius Reeder   PCP: Karna DupesBlake, Khashana A, MD   Recommendations at discharge:    Follow-up hospitalization.  Follow-up repeat blood cultures see below.  Discharge Diagnoses: Principal Problem:   Stercoral colitis Active Problems:   Lactic acidosis   Hypokalemia   Essential hypertension   Mixed hyperlipidemia   Dementia without behavioral disturbance (HCC)   Cerebrovascular disease   Ileus (HCC)   Bacteremia  Resolved Problems:   * No resolved hospital problems. *  Hospital Course: 81 year old man PMH including dementia, TIA presenting with abdominal pain, constipation.  Noted to have lactic acidosis and mild leukocytosis.  CT abdomen and pelvis demonstrated distended colon with large amount of retained stool, perirectal fat stranding concerning for fecal impaction versus stercoral colitis.  Disimpaction with modest results but responded well to Fleet enema.  Admitted for stercoral colitis/fecal impaction.  Started on aggressive bowel regimen.  Had 3 good sized bowel movements.  Plain radiograph showed no free air, but did show ileus.  Also with bacteremia, initially thought to be contaminant but subsequently in 2 bottles.  Ileus now resolved and tolerating diet.  Discussed with ID, treated for total 5 days for bacteremia given presence in 2 sets.  Repeat blood cultures pending. Updated sister by telephone.  Stercoral colitis -- Resolved.   Ileus.   --Repeat abdominal x-ray demonstrates improving ileus.  This correlates clinically and ileus now appears resolved. Bowels moving.  Tolerating diet.  Potassium at goal.   Gram-positive bacteremia, initially thought to be contaminant, but present in 2 bottles. --Discussed with ID, will treat total 5 days with vancomycin.  Cannot rule out pathogenic. No further evaluation  suggested.  Repeat blood cultures pending.   Lactic acidosis --Secondary to volume depletion.  Sepsis ruled out.    Hypokalemia -- Replete.    Essential hypertension --Stable off antihypertensives.    Dementia without behavioral disturbance (HCC) --Longstanding known history of dementia and cognitive deficit --Stable.  [facility].   Aortic atherosclerosis. -- No treatment indicated.     Consultants:  None Procedures performed:  None  Disposition: Long-term care Diet recommendation:  Diet Orders (From admission, onward)     Start     Ordered   12/14/21 1200  DIET SOFT Room service appropriate? Yes; Fluid consistency: Thin  Diet effective now       Question Answer Comment  Room service appropriate? Yes   Fluid consistency: Thin      12/14/21 1159            DISCHARGE MEDICATION: Allergies as of 12/17/2021   No Known Allergies      Medication List     STOP taking these medications    doxycycline 100 MG tablet Commonly known as: VIBRA-TABS   lisinopril 20 MG tablet Commonly known as: ZESTRIL       TAKE these medications    acetaminophen 500 MG tablet Commonly known as: TYLENOL Take 500 mg by mouth every 6 (six) hours as needed for mild pain.   amLODipine 10 MG tablet Commonly known as: NORVASC Take 10 mg by mouth daily.   atorvastatin 20 MG tablet Commonly known as: LIPITOR Take 60 mg by mouth daily.   calcium carbonate 500 MG chewable tablet Commonly known as: TUMS - dosed in mg elemental calcium Chew 1 tablet by mouth daily.  clopidogrel 75 MG tablet Commonly known as: PLAVIX Take 75 mg by mouth daily.   ketoconazole 2 % shampoo Commonly known as: NIZORAL Apply 1 Application topically. On Mondays every 2 weeks   melatonin 3 MG Tabs tablet Take 3 mg by mouth at bedtime.   mirtazapine 15 MG tablet Commonly known as: REMERON Take 15 mg by mouth at bedtime.   multivitamin with minerals Tabs  tablet Take 1 tablet by mouth daily. Start taking on: December 18, 2021   Psyllium 400 MG Caps Take 1 tablet by mouth daily.   sennosides-docusate sodium 8.6-50 MG tablet Commonly known as: SENOKOT-S Take 2 tablets by mouth daily.   Vitamin D (Ergocalciferol) 1.25 MG (50000 UNIT) Caps capsule Commonly known as: DRISDOL Take 50,000 Units by mouth every Monday.       Feels ok No pain  Follow-up Information     [name], MD. Schedule an appointment as soon as possible for a visit in 1 week(s).   Specialty: Family Medicine Contact information: [address] [place] [address] [phone]                Discharge Exam: Ceasar Mons Weights   12/10/21 1938  Weight: 91.6 kg   Physical Exam Vitals reviewed.  Constitutional:      General: He is not in acute distress.    Appearance: He is not ill-appearing or toxic-appearing.  Cardiovascular:     Rate and Rhythm: Normal rate and regular rhythm.     Heart sounds: No murmur heard. Pulmonary:     Effort: Pulmonary effort is normal. No respiratory distress.     Breath sounds: No wheezing, rhonchi or rales.  Abdominal:     General: There is no distension.     Palpations: Abdomen is soft.     Tenderness: There is no abdominal tenderness. There is no guarding.  Musculoskeletal:     Right lower leg: No edema.     Left lower leg: No edema.  Neurological:     Mental Status: He is alert.  Psychiatric:        Mood and Affect: Mood normal.        Behavior: Behavior normal.   BM x1 K+ 4.1  Condition at discharge: good  The results of significant diagnostics from this hospitalization (including imaging, microbiology, ancillary and laboratory) are listed below for reference.   Imaging Studies: DG Abd Portable 1V  Result Date: 12/16/2021 CLINICAL DATA:  Resolving ileus EXAM: PORTABLE ABDOMEN - 1 VIEW COMPARISON:  12/12/2021 FINDINGS: Gas-filled, although generally nondistended bowel throughout the abdomen, with gas  present to the distal colon. In the low central abdomen there is a gas-filled loop of redundant sigmoid colon measuring up to 5.6 cm, diminished in caliber compared to prior examination, previously 7.8 cm. No free air on single supine radiograph. No large burden of stool. IMPRESSION: Gas-filled, although generally nondistended bowel throughout the abdomen, with gas present to the distal colon. In the low central abdomen there is a gas-filled loop of redundant sigmoid colon measuring up to 5.6 cm, diminished in caliber compared to prior examination, previously 7.8 cm. Findings are consistent with resolving ileus. Electronically Signed   By: Jearld Lesch M.D.   On: 12/16/2021 13:28   DG Abd 2 Views  Result Date: 12/12/2021 CLINICAL DATA:  Ileus EXAM: ABDOMEN - 2 VIEW COMPARISON:  12/11/2021 FINDINGS: Inferior pelvis excluded. Air-filled loops of large and small bowel throughout abdomen. Mildly prominent sigmoid loop. No definite bowel wall thickening or  free air. Bibasilar atelectasis. Osseous demineralization with multilevel degenerative disc disease changes of thoracolumbar spine. IMPRESSION: Persistent gaseous distention of bowel loops throughout the abdomen greatest at sigmoid colon, question ileus. Electronically Signed   By: Ulyses Southward M.D.   On: 12/12/2021 11:26   DG Abd 2 Views  Result Date: 12/11/2021 CLINICAL DATA:  Abdominal distension. EXAM: ABDOMEN - 2 VIEW COMPARISON:  CT abdomen 12/10/2021 FINDINGS: Again noted is a large amount of bowel gas in the abdomen which is similar to the recent comparison examination. No gross abnormality at the lung bases. No evidence for free air. IMPRESSION: Abdominal bowel gas pattern is similar to the recent CT on 12/10/2021. Findings could represent an ileus pattern particularly in the colon. Electronically Signed   By: Richarda Overlie M.D.   On: 12/11/2021 10:53   CT Abdomen Pelvis W Contrast  Result Date: 12/10/2021 CLINICAL DATA:  Abdominal pain and  distention. EXAM: CT ABDOMEN AND PELVIS WITH CONTRAST TECHNIQUE: Multidetector CT imaging of the abdomen and pelvis was performed using the standard protocol following bolus administration of intravenous contrast. RADIATION DOSE REDUCTION: This exam was performed according to the departmental dose-optimization program which includes automated exposure control, adjustment of the mA and/or kV according to patient size and/or use of iterative reconstruction technique. CONTRAST:  OMNIPAQUE IOHEXOL 300 MG/ML  SOLN COMPARISON:  None Available. FINDINGS: Lower chest: Atelectasis is noted at the lung bases. Hepatobiliary: Subcentimeter hypodensities are present in the kidney which are too small to further characterize. The gallbladder is without stones. No biliary ductal dilatation. Pancreas: Unremarkable. No pancreatic ductal dilatation or surrounding inflammatory changes. Spleen: Normal in size without focal abnormality. Adrenals/Urinary Tract: No adrenal nodule or mass. The kidneys enhance symmetrically. A cyst is present in the mid left kidney. No renal calculus or hydronephrosis. The bladder is partially distended and otherwise within normal limits. There is herniation the anterior aspect of the urinary bladder on the right into a right inguinal hernia. Stomach/Bowel: The distal esophagus is fluid-filled, possible reflux. The stomach is within normal limits. The small bowel is normal in caliber. No free air or pneumatosis. The colon is distended with air and there is a large amount of stool in the sigmoid colon and rectum. Mild perirectal fat stranding is noted. No focal bowel wall thickening. A normal appendix is seen in the right lower quadrant. Vascular/Lymphatic: Aortic atherosclerosis. No enlarged abdominal or pelvic lymph nodes. Reproductive: Enlarged prostate gland. Other: No abdominopelvic ascites. There is a left inguinal hernia containing fat. A small fat containing umbilical hernia is present.  Musculoskeletal: Degenerative changes are present in the thoracolumbar spine. No acute osseous abnormality. Severe spinal canal stenosis is present at L4-L5. IMPRESSION: 1. Distended colon with large amount of retained stool in the sigmoid colon and rectum with mild perirectal fat stranding, possible fecal impaction versus stercoral colitis. 2. Fluid attenuation of the distal esophagus, suggesting reflux. 3. Enlarged prostate gland. 4. Aortic atherosclerosis. 5. Remaining incidental findings as described above. Electronically Signed   By: Thornell Sartorius M.D.   On: 12/10/2021 22:04    Microbiology: Results for orders placed or performed during the hospital encounter of 12/10/21  Culture, blood (Routine X 2) w Reflex to ID Panel     Status: Abnormal   Collection Time: 12/11/21  9:35 AM   Specimen: BLOOD  Result Value Ref Range Status   Specimen Description   Final    BLOOD SITE NOT SPECIFIED Performed at [facility], [address]  [place], [place] [address]    Special Requests   Final    BOTTLES DRAWN AEROBIC AND ANAEROBIC Blood Culture results may not be optimal due to an inadequate volume of blood received in culture bottles Performed at [facility], [address] [place], [place] [address]    Culture  Setup Time   Final    GRAM POSITIVE COCCI IN CLUSTERS AEROBIC BOTTLE ONLY CRITICAL RESULT CALLED TO, READ BACK BY AND VERIFIED WITH: PHARMD AWeston Anna [id number] @1533  FH    Culture (A)  Final    STAPHYLOCOCCUS AURICULARIS THE SIGNIFICANCE OF ISOLATING THIS ORGANISM FROM A SINGLE SET OF BLOOD CULTURES WHEN MULTIPLE SETS ARE DRAWN IS UNCERTAIN. PLEASE NOTIFY THE MICROBIOLOGY DEPARTMENT WITHIN ONE WEEK IF SPECIATION AND SENSITIVITIES ARE REQUIRED. Performed at [facility], [address] [place], [place]    Report Status 12/16/2021 FINAL  Final  Culture, blood (Routine X 2) w Reflex to ID Panel     Status: Abnormal   Collection Time:  12/11/21  9:38 AM   Specimen: BLOOD  Result Value Ref Range Status   Specimen Description   Final    BLOOD SITE NOT SPECIFIED Performed at [facility], [address], [place], [place]    Special Requests   Final    BOTTLES DRAWN AEROBIC AND ANAEROBIC Blood Culture adequate volume Performed at [facility], [address], [place], [place]    Culture  Setup Time   Final    GRAM POSITIVE COCCI IN CLUSTERS ANAEROBIC BOTTLE ONLY CRITICAL RESULT CALLED TO, READ BACK BY AND VERIFIED WITH: [id number] SAVOIE Jaclyn Prime AT 0848 BY EC ALSO CALLED TO PAHRMD DREW WOFFARD [id number] BY EC AT [id number]    Culture (A)  Final    STAPHYLOCOCCUS EPIDERMIDIS STAPHYLOCOCCUS AURICULARIS THE SIGNIFICANCE OF ISOLATING THIS ORGANISM FROM A SINGLE SET OF BLOOD CULTURES WHEN MULTIPLE SETS ARE DRAWN IS UNCERTAIN. PLEASE NOTIFY THE MICROBIOLOGY DEPARTMENT WITHIN ONE WEEK IF SPECIATION AND SENSITIVITIES ARE REQUIRED. Performed at [facility], [address] [place], [place]    Report Status 12/15/2021 FINAL  Final   Organism ID, Bacteria STAPHYLOCOCCUS EPIDERMIDIS  Final      Susceptibility   Staphylococcus epidermidis - MIC*    CIPROFLOXACIN <=0.5 SENSITIVE Sensitive     ERYTHROMYCIN >=8 RESISTANT Resistant     GENTAMICIN <=0.5 SENSITIVE Sensitive     OXACILLIN >=4 RESISTANT Resistant     TETRACYCLINE 2 SENSITIVE Sensitive     VANCOMYCIN 2 SENSITIVE Sensitive     TRIMETH/SULFA <=10 SENSITIVE Sensitive     CLINDAMYCIN RESISTANT Resistant     RIFAMPIN <=0.5 SENSITIVE Sensitive     Inducible Clindamycin POSITIVE Resistant     * STAPHYLOCOCCUS EPIDERMIDIS  Blood Culture ID Panel (Reflexed)     Status: Abnormal   Collection Time: 12/11/21  9:38 AM  Result Value Ref Range Status   Enterococcus faecalis NOT DETECTED NOT DETECTED Final   Enterococcus Faecium NOT DETECTED NOT DETECTED Final   Listeria monocytogenes NOT DETECTED NOT  DETECTED Final   Staphylococcus species DETECTED (A) NOT DETECTED Final    Comment: CRITICAL RESULT CALLED TO, READ BACK BY AND VERIFIED WITH: PHARMD BRIANNA SAVOIE 12/13/21 AT 0848 BY EC    Staphylococcus aureus (BCID) NOT DETECTED NOT DETECTED Final   Staphylococcus epidermidis DETECTED (A) NOT DETECTED Final    Comment: Methicillin (oxacillin) resistant coagulase negative staphylococcus. Possible blood culture contaminant (unless isolated from more  than one blood culture draw or clinical case suggests pathogenicity). No antibiotic treatment is indicated for blood  culture contaminants. CRITICAL RESULT CALLED TO, READ BACK BY AND VERIFIED WITH: PHARMD Mickie Bail [phone] AT 0848 BY EC    Staphylococcus lugdunensis NOT DETECTED NOT DETECTED Final   Streptococcus species NOT DETECTED NOT DETECTED Final   Streptococcus agalactiae NOT DETECTED NOT DETECTED Final   Streptococcus pneumoniae NOT DETECTED NOT DETECTED Final   Streptococcus pyogenes NOT DETECTED NOT DETECTED Final   A.calcoaceticus-baumannii NOT DETECTED NOT DETECTED Final   Bacteroides fragilis NOT DETECTED NOT DETECTED Final   Enterobacterales NOT DETECTED NOT DETECTED Final   Enterobacter cloacae complex NOT DETECTED NOT DETECTED Final   Escherichia coli NOT DETECTED NOT DETECTED Final   Klebsiella aerogenes NOT DETECTED NOT DETECTED Final   Klebsiella oxytoca NOT DETECTED NOT DETECTED Final   Klebsiella pneumoniae NOT DETECTED NOT DETECTED Final   Proteus species NOT DETECTED NOT DETECTED Final   Salmonella species NOT DETECTED NOT DETECTED Final   Serratia marcescens NOT DETECTED NOT DETECTED Final   Haemophilus influenzae NOT DETECTED NOT DETECTED Final   Neisseria meningitidis NOT DETECTED NOT DETECTED Final   Pseudomonas aeruginosa NOT DETECTED NOT DETECTED Final   Stenotrophomonas maltophilia NOT DETECTED NOT DETECTED Final   Candida albicans NOT DETECTED NOT DETECTED Final   Candida auris NOT DETECTED NOT  DETECTED Final   Candida glabrata NOT DETECTED NOT DETECTED Final   Candida krusei NOT DETECTED NOT DETECTED Final   Candida parapsilosis NOT DETECTED NOT DETECTED Final   Candida tropicalis NOT DETECTED NOT DETECTED Final   Cryptococcus neoformans/gattii NOT DETECTED NOT DETECTED Final   Methicillin resistance mecA/C DETECTED (A) NOT DETECTED Final    Comment: CRITICAL RESULT CALLED TO, READ BACK BY AND VERIFIED WITH: Mckinley Jewel [id number] AT 0848 BY EC Performed at [facility], [address], [place], [place] [address]   Culture, blood (Routine X 2) w Reflex to ID Panel     Status: None (Preliminary result)   Collection Time: 12/15/21  8:47 AM   Specimen: BLOOD LEFT HAND  Result Value Ref Range Status   Specimen Description   Final    BLOOD LEFT HAND Performed at [facility], [address] [place], [place] [address]    Special Requests   Final    IN PEDIATRIC BOTTLE Blood Culture results may not be optimal due to an excessive volume of blood received in culture bottles Performed at [facility], [address] [place], [place] [address]    Culture   Final    NO GROWTH 2 DAYS Performed at [facility], [address] [place], [place] [address]    Report Status PENDING  Incomplete  Culture, blood (Routine X 2) w Reflex to ID Panel     Status: None (Preliminary result)   Collection Time: 12/15/21  8:47 AM   Specimen: Right Antecubital; Blood  Result Value Ref Range Status   Specimen Description   Final    RIGHT ANTECUBITAL BLOOD Performed at [facility], [address], [place], [place] [address]    Special Requests   Final    Blood Culture adequate volume IN PEDIATRIC BOTTLE Performed at [facility], [address] [place], [place] [address]    Culture   Final    NO GROWTH 2 DAYS Performed at [facility], [address] [place], [place] [address]    Report  Status PENDING   Incomplete    Labs: CBC: Recent Labs  Lab 12/10/21 2055 12/11/21 0919 12/12/21 0742  WBC 16.2* 11.5* 7.0  NEUTROABS 14.2* 8.9* 5.0  HGB 13.3 12.1* 11.3*  HCT 41.1 38.7* 34.5*  MCV 85.1 89.2 86.9  PLT 215 161 163   Basic Metabolic Panel: Recent Labs  Lab 12/11/21 0919 12/12/21 0742 12/13/21 0609 12/14/21 0543 12/15/21 0538 12/16/21 0524 12/17/21 0514  NA 139 142 143 144  --  138 140  K 4.2 3.0* 3.8 3.9  --  3.0* 4.1  CL 111 110 116* 117*  --  111 116*  CO2 21* --  22 20*  GLUCOSE 86 82 98 93  --  115* 87  BUN --  21 22  CREATININE 0.82 0.87 1.02 0.81 0.77 0.89 0.89  CALCIUM 8.6* 8.4* 8.7* 8.9  --  8.5* 8.8*  MG 2.1 2.0  --  1.9  --  2.0  --   PHOS  --   --   --   --   --  3.5  --    Liver Function Tests: Recent Labs  Lab 12/10/21 2055 12/11/21 0919 12/12/21 0742  AST ALT ALKPHOS 89 67 58  BILITOT 0.8 0.7 0.6  PROT 7.7 6.1* 6.0*  ALBUMIN 3.7 2.9* 2.7*   CBG: No results for input(s): "GLUCAP" in the last 168 hours.  Discharge time spent: less than 30 minutes.  Signed: Brendia Sacks, MD [facility] 12/17/2021

## 2021-12-17 NOTE — NC FL2 (Signed)
[place] MEDICAID FL2 LEVEL OF CARE SCREENING TOOL     IDENTIFICATION  Patient Name: Jeff Wilson Birthdate: February 08, 1941 Sex: male Admission Date (Current Location): 12/10/2021  [facility] and [place] Number:  [facility] Bast [id number] R Facility and Address:         Provider Number: [id number]  Attending Physician Name and Address:  Standley Brooking, MD  Relative Name and Phone Number:  [name] [phone]    Current Level of Care: Hospital Recommended Level of Care: Assisted Living Facility Prior Approval Number:    Date Approved/Denied:   PASRR Number:    Discharge Plan: Other (Comment) (ALF)    Current Diagnoses: Patient Active Problem List   Diagnosis Date Noted   Bacteremia 12/13/2021   Ileus (HCC) 12/12/2021   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   Mixed hyperlipidemia 12/11/2021   Dementia without behavioral disturbance (HCC) 12/11/2021   Lactic acidosis 12/11/2021   Cerebrovascular disease 12/11/2021   Hypokalemia 12/11/2021   Impaired mobility and ADLs 06/19/2020   Acute ischemic stroke (HCC) 04/01/2019   Ambulatory dysfunction 04/01/2019   TIA (transient ischemic attack) 03/28/2019   Essential hypertension 01/28/2019   Fall at home, initial encounter 01/28/2019   History of CVA (cerebrovascular accident) 01/28/2019   Non compliance w medication regimen 01/28/2019   Weakness of left lower extremity 01/28/2019    Orientation RESPIRATION BLADDER Height & Weight     Self, Situation, Place  Normal Incontinent, External catheter Weight: 202 lb (91.6 kg) Height:  5\' 9"  (175.3 cm)  BEHAVIORAL SYMPTOMS/MOOD NEUROLOGICAL BOWEL NUTRITION STATUS      Incontinent Diet (Regular)  AMBULATORY STATUS COMMUNICATION OF NEEDS Skin   Limited Assist Verbally Normal                       Personal Care Assistance Level of Assistance  Bathing, Dressing, Feeding Bathing Assistance: Limited assistance Feeding assistance: Independent Dressing Assistance: Limited  assistance     Functional Limitations Info  Sight, Hearing, Speech Sight Info: Impaired Hearing Info: Adequate Speech Info: Adequate    SPECIAL CARE FACTORS FREQUENCY                       Contractures Contractures Info: Not present    Additional Factors Info  Code Status, Allergies, Psychotropic Code Status Info: FULL Allergies Info: No Known Allergies Psychotropic Info: See MAR         Current Medications (12/17/2021):  This is the current hospital active medication list Current Facility-Administered Medications  Medication Dose Route Frequency Provider Last Rate Last Admin   acetaminophen (TYLENOL) tablet 650 mg  650 mg Oral Q6H PRN 12/19/2021, MD   650 mg at 12/16/21 2154   Or   acetaminophen (TYLENOL) suppository 650 mg  650 mg Rectal Q6H PRN Shalhoub, 2155, MD       atorvastatin (LIPITOR) tablet 60 mg  60 mg Oral Daily Shalhoub, Deno Lunger, MD   60 mg at 12/17/21 0932   clopidogrel (PLAVIX) tablet 75 mg  75 mg Oral Daily 12/19/21, MD   75 mg at 12/17/21 0932   enoxaparin (LOVENOX) injection 40 mg  40 mg Subcutaneous Q24H 12/19/21, MD   40 mg at 12/17/21 0934   feeding supplement (BOOST / RESOURCE BREEZE) liquid 1 Container  1 Container Oral TID BM 12/19/21, MD   1 Container at 12/17/21 0934   hydrALAZINE (APRESOLINE) injection 10 mg  10 mg Intravenous Q6H PRN 12/19/21,  MD       magnesium oxide (MAG-OX) tablet 400 mg  400 mg Oral Daily Standley Brooking, MD   400 mg at 12/17/21 0932   melatonin tablet 3 mg  3 mg Oral QHS Shalhoub, Deno Lunger, MD   3 mg at 12/16/21 2154   mirtazapine (REMERON) tablet 15 mg  15 mg Oral QHS Marinda Elk, MD   15 mg at 12/16/21 2154   multivitamin with minerals tablet 1 tablet  1 tablet Oral Daily Standley Brooking, MD   1 tablet at 12/17/21 0932   ondansetron (ZOFRAN) tablet 4 mg  4 mg Oral Q6H PRN Marinda Elk, MD       Or   ondansetron [facility]) injection 4 mg  4 mg  Intravenous Q6H PRN Shalhoub, Deno Lunger, MD       polyethylene glycol (MIRALAX / GLYCOLAX) packet 17 g  17 g Oral Daily Shalhoub, Deno Lunger, MD   17 g at 12/17/21 0935   polyethylene glycol (MIRALAX / GLYCOLAX) packet 17 g  17 g Oral Daily PRN Marinda Elk, MD   17 g at 12/16/21 2154   senna-docusate (Senokot-S) tablet 2 tablet  2 tablet Oral Daily Shalhoub, Deno Lunger, MD   2 tablet at 12/17/21 5852     Discharge Medications: Please see discharge summary for a list of discharge medications.  Relevant Imaging Results:  Relevant Lab Results:   Additional Information    Otelia Santee, LCSW

## 2021-12-17 NOTE — Care Management Important Message (Signed)
Important Message  Patient Details IM Letter placed in Patients room. Name: Jeff Wilson MRN: [id number] Date of Birth: 09-May-1940   Medicare Important Message Given:  Yes     Caren Macadam 12/17/2021, 11:26 AM

## 2021-12-17 NOTE — Progress Notes (Signed)
Report called to [facility]. PIV removed. Discharge instructions completed. Patient verbalized understanding of medication regimen, follow up appointments and discharge instructions. Patient belongings gathered and packed to discharge.

## 2021-12-20 LAB — CULTURE, BLOOD (ROUTINE X 2)
Culture: NO GROWTH
Culture: NO GROWTH
Special Requests: ADEQUATE

## 2022-09-06 IMAGING — CT CT HEAD W/O CM
3 series · 15 of 47 positions shown, 18 images · non-contrast
Comparison: Head CT July 20, 2019 and brain MRI March 29, 2019

CLINICAL DATA: Mental status change, unknown cause

EXAM:
CT HEAD WITHOUT CONTRAST
TECHNIQUE: Contiguous axial images were obtained from the base of the skull
through the vertex without intravenous contrast.

[Series 2: head wo · axial · 0.43mm/px · z∈[+1278,+1402]mm · 9 of 31 slices shown, 12 images]
[im 3/31  brain]
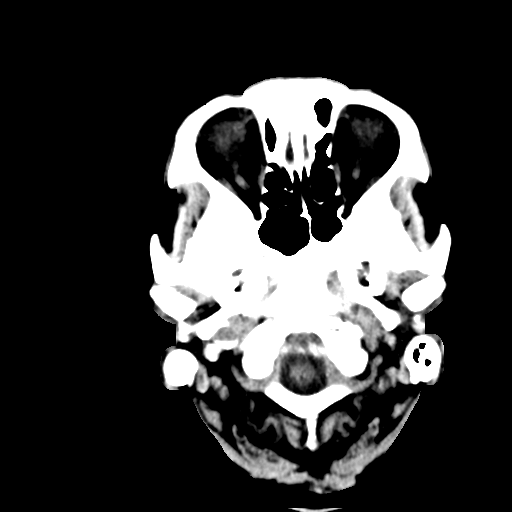
[im 3/31  bone]
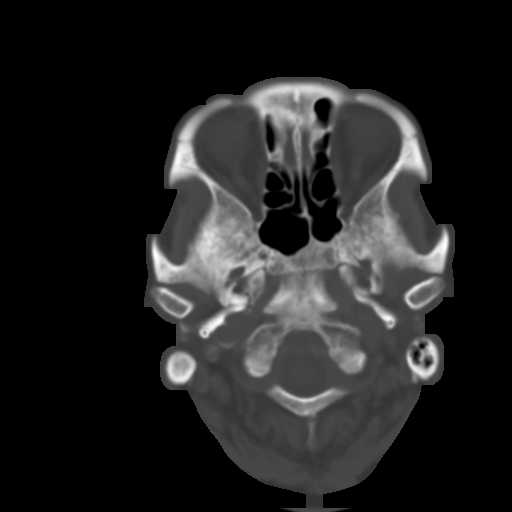
[im 6/31  brain]
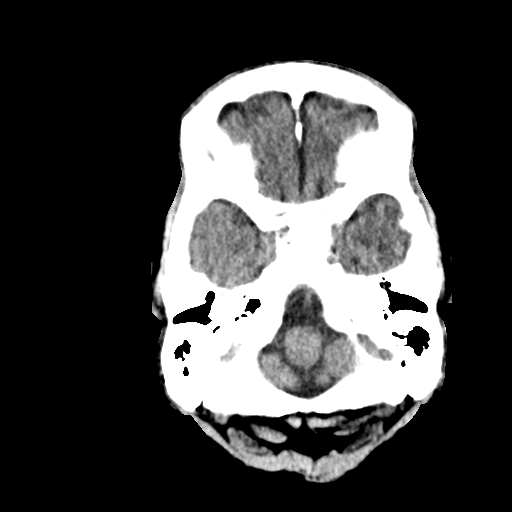
[im 9/31  brain]
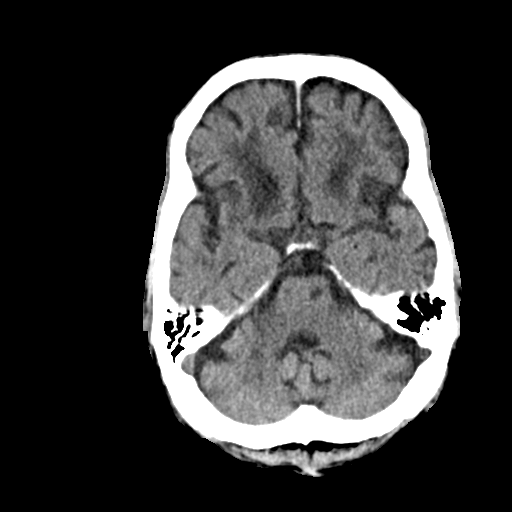
[im 12/31  brain]
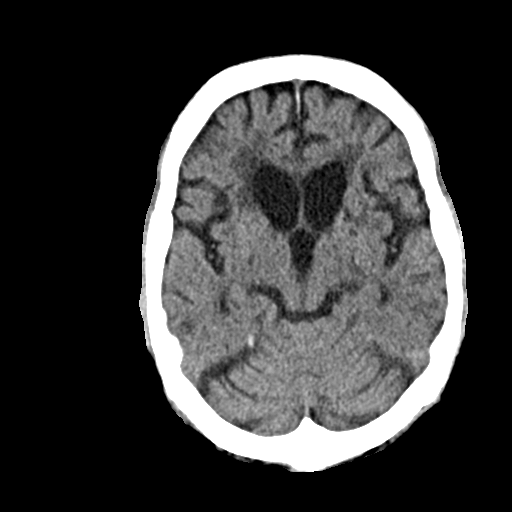
[im 16/31  brain]
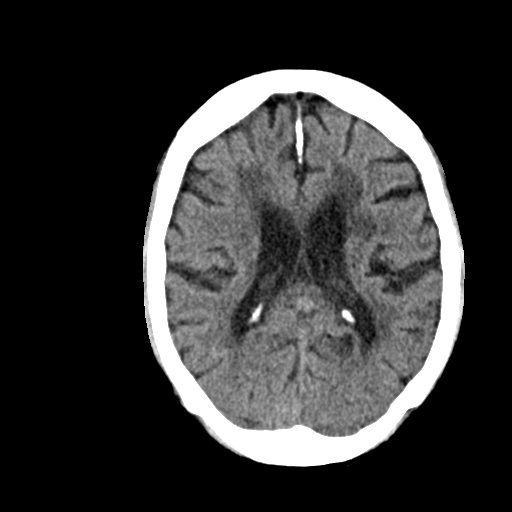
[im 16/31  bone]
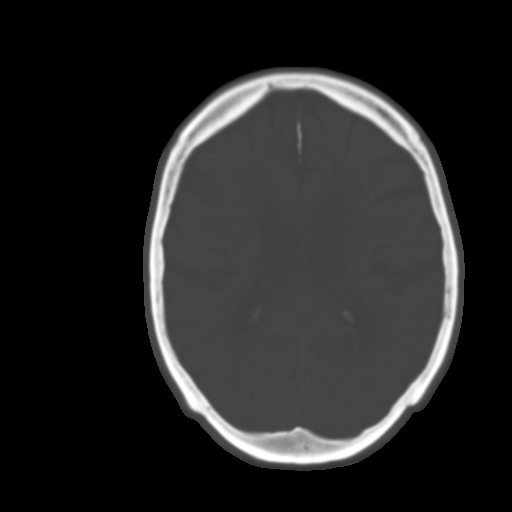
[im 19/31  brain]
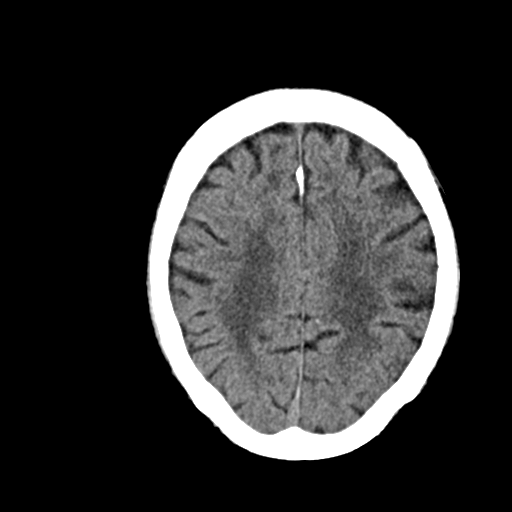
[im 22/31  brain]
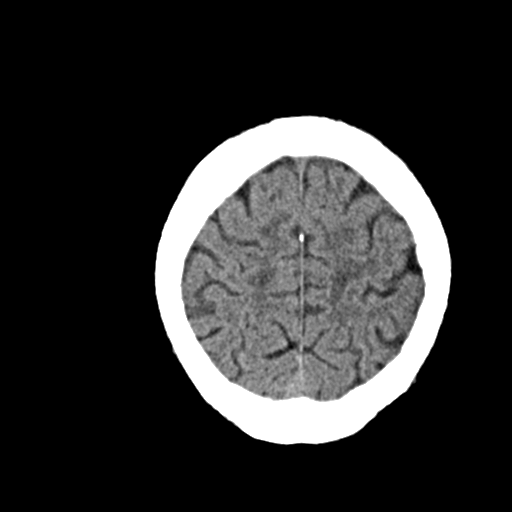
[im 25/31  brain]
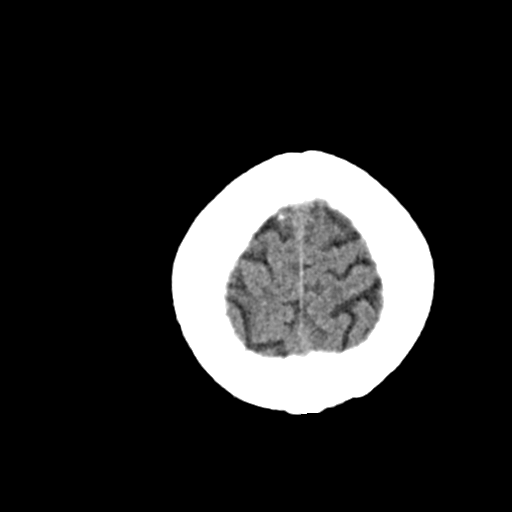
[im 28/31  brain]
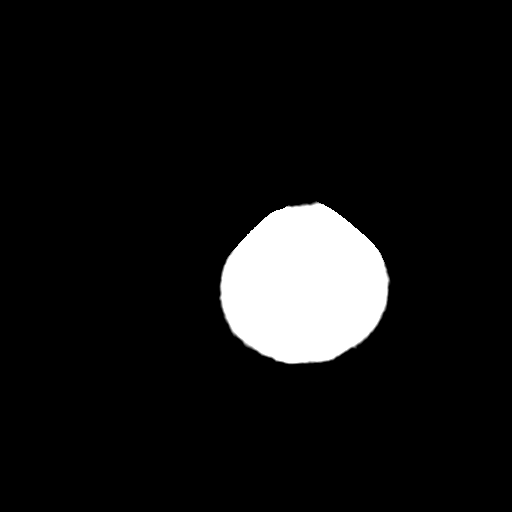
[im 28/31  bone]
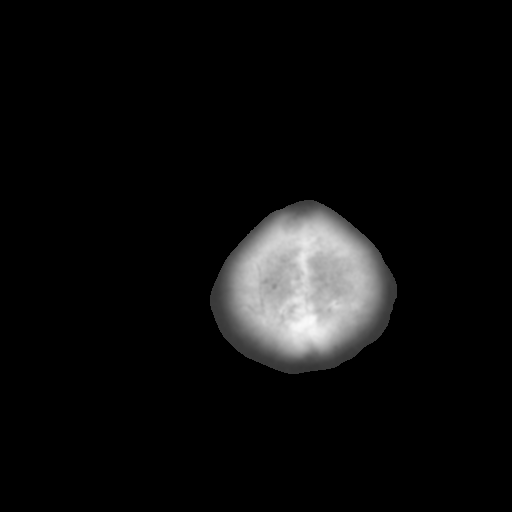

[Series 4: coronal soft · coronal · 0.29mm/px · 3 of 61 slices shown]
[im 21/61  brain]
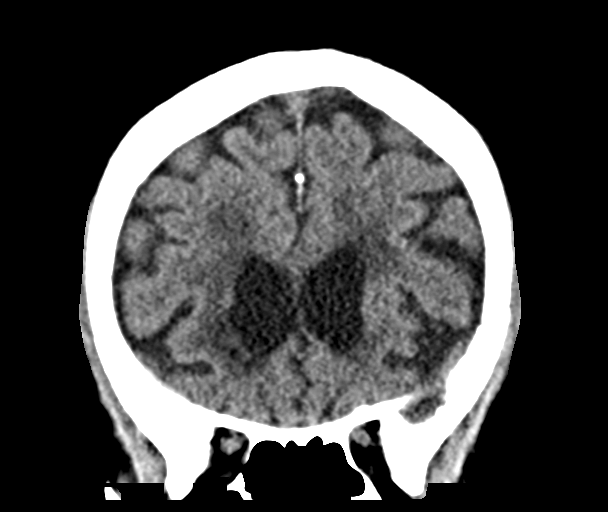
[im 27/61  brain]
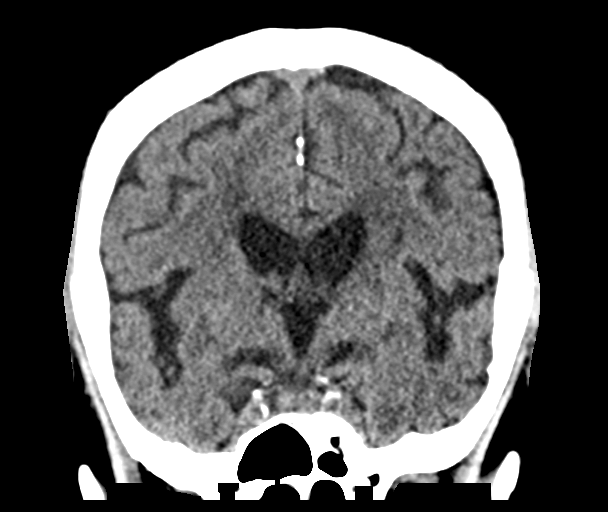
[im 34/61  brain]
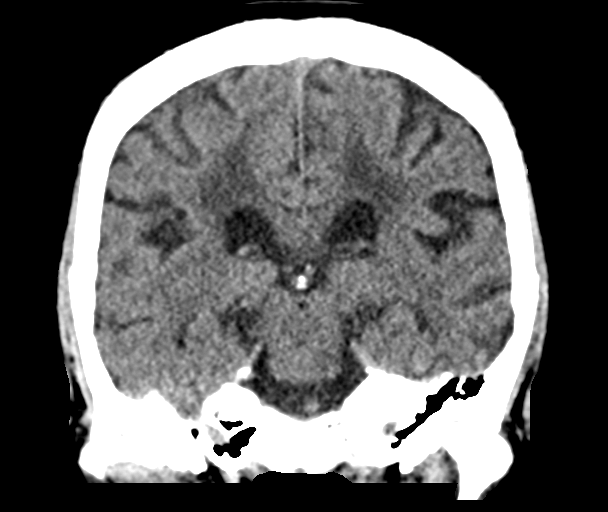

[Series 5: sag soft · sagittal · 0.29mm/px · 3 of 56 slices shown]
[im 19/56  brain]
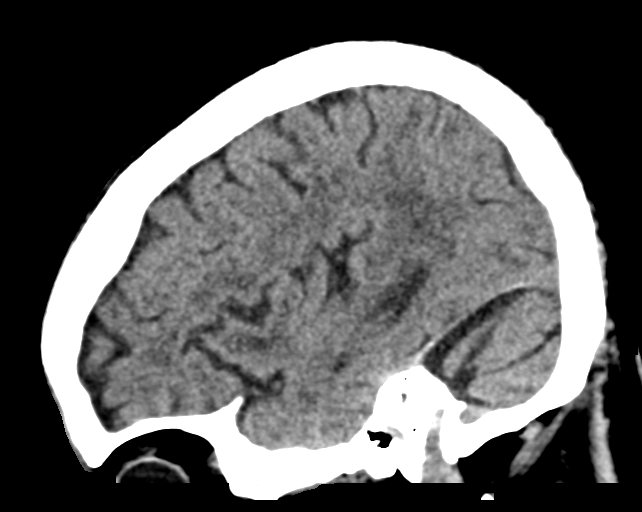
[im 28/56  brain]
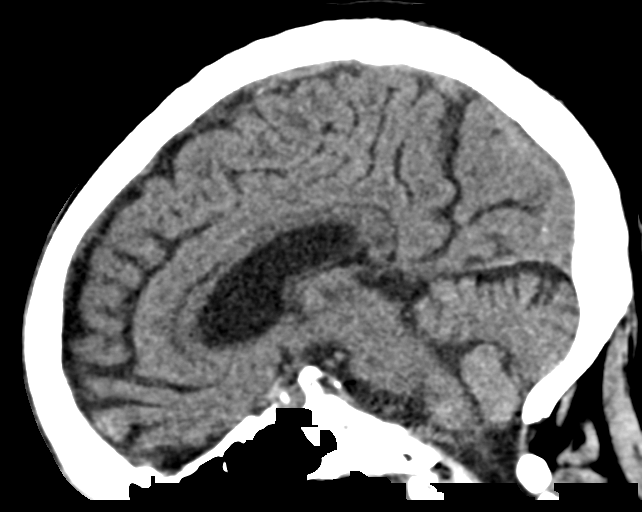
[im 37/56  brain]
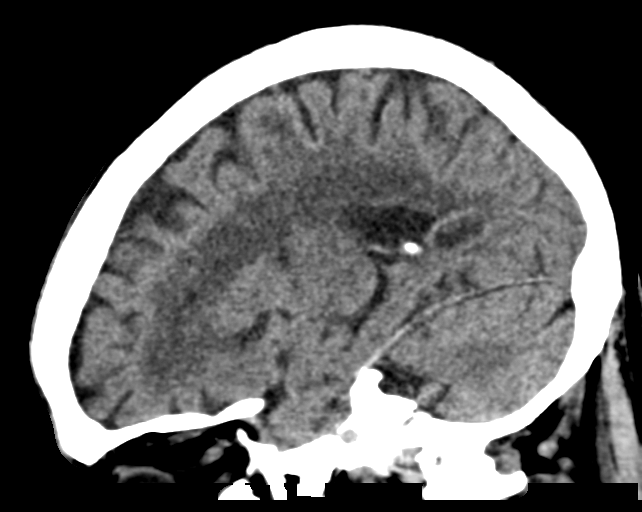

[15 of 47 positions shown; findings below may reference images not displayed]

FINDINGS: Brain: Severe sequela of chronic small vessel disease throughout the
periventricular and subcortical white matter, similar prior. Remote
bilateral basal ganglia, thalamic, and left pons lacunar infarcts.
Mild diffuse cerebral atrophy, unchanged. No acute intracranial
abnormality. Specifically, no hemorrhage, hydrocephalus, mass lesion
or acute infarction.

Vascular: No hyperdense vessel or unexpected calcification.

Skull: Normal. Negative for fracture or focal lesion.

Sinuses/Orbits: No acute finding.

Other: None.
IMPRESSION: 1. No acute intracranial findings.
2. Similar severe sequela of chronic small vessel disease as well as
remote bilateral basal ganglia, thalamic, and left pons lacunar
infarcts.

## 2022-11-06 ENCOUNTER — Emergency Department (HOSPITAL_COMMUNITY): Payer: Medicare HMO

## 2022-11-06 ENCOUNTER — Emergency Department (HOSPITAL_COMMUNITY)
Admission: EM | Admit: 2022-11-06 | Discharge: 2022-11-07 | Disposition: A | Discharge status: Home / Self Care | Payer: Medicare HMO | Attending: Emergency Medicine | Admitting: Emergency Medicine

## 2022-11-06 ENCOUNTER — Encounter (HOSPITAL_COMMUNITY): Payer: Self-pay

## 2022-11-06 ENCOUNTER — Other Ambulatory Visit: Payer: Self-pay

## 2022-11-06 DIAGNOSIS — Z7902 Long term (current) use of antithrombotics/antiplatelets: Secondary | ICD-10-CM | POA: Diagnosis not present

## 2022-11-06 DIAGNOSIS — R479 Unspecified speech disturbances: Secondary | ICD-10-CM | POA: Diagnosis not present

## 2022-11-06 DIAGNOSIS — I680 Cerebral amyloid angiopathy: Secondary | ICD-10-CM | POA: Diagnosis not present

## 2022-11-06 DIAGNOSIS — I10 Essential (primary) hypertension: Secondary | ICD-10-CM | POA: Insufficient documentation

## 2022-11-06 DIAGNOSIS — Z79899 Other long term (current) drug therapy: Secondary | ICD-10-CM | POA: Insufficient documentation

## 2022-11-06 DIAGNOSIS — F0390 Unspecified dementia without behavioral disturbance: Secondary | ICD-10-CM | POA: Insufficient documentation

## 2022-11-06 DIAGNOSIS — E859 Amyloidosis, unspecified: Secondary | ICD-10-CM | POA: Insufficient documentation

## 2022-11-06 DIAGNOSIS — R4182 Altered mental status, unspecified: Secondary | ICD-10-CM

## 2022-11-06 DIAGNOSIS — E854 Organ-limited amyloidosis: Secondary | ICD-10-CM

## 2022-11-06 LAB — I-STAT CHEM 8, ED
BUN: 16 mg/dL (ref 8–23)
Calcium, Ion: 1.09 mmol/L — ABNORMAL LOW (ref 1.15–1.40)
Chloride: 103 mmol/L (ref 98–111)
Creatinine, Ser: 0.9 mg/dL (ref 0.61–1.24)
Glucose, Bld: 86 mg/dL (ref 70–99)
HCT: 41 % (ref 39.0–52.0)
Hemoglobin: 13.9 g/dL (ref 13.0–17.0)
Potassium: 3.1 mmol/L — ABNORMAL LOW (ref 3.5–5.1)
Sodium: 142 mmol/L (ref 135–145)
TCO2: 27 mmol/L (ref 22–32)

## 2022-11-06 LAB — COMPREHENSIVE METABOLIC PANEL
ALT: 24 U/L (ref 0–44)
AST: 25 U/L (ref 15–41)
Albumin: 3.4 g/dL — ABNORMAL LOW (ref 3.5–5.0)
Alkaline Phosphatase: 108 U/L (ref 38–126)
Anion gap: 9 (ref 5–15)
BUN: 15 mg/dL (ref 8–23)
CO2: 29 mmol/L (ref 22–32)
Calcium: 9.3 mg/dL (ref 8.9–10.3)
Chloride: 104 mmol/L (ref 98–111)
Creatinine, Ser: 0.89 mg/dL (ref 0.61–1.24)
GFR, Estimated: >60 mL/min (ref >60)
Glucose, Bld: 90 mg/dL (ref 70–99)
Potassium: 3.1 mmol/L — ABNORMAL LOW (ref 3.5–5.1)
Sodium: 142 mmol/L (ref 135–145)
Total Bilirubin: 0.7 mg/dL (ref 0.3–1.2)
Total Protein: 7 g/dL (ref 6.5–8.1)

## 2022-11-06 LAB — DIFFERENTIAL
Abs Immature Granulocytes: 0.01 10*3/uL (ref 0.00–0.07)
Basophils Absolute: 0 10*3/uL (ref 0.0–0.1)
Basophils Relative: 1 %
Eosinophils Absolute: 0.1 10*3/uL (ref 0.0–0.5)
Eosinophils Relative: 1 %
Immature Granulocytes: 0 %
Lymphocytes Relative: 23 %
Lymphs Abs: 1.3 10*3/uL (ref 0.7–4.0)
Monocytes Absolute: 0.5 10*3/uL (ref 0.1–1.0)
Monocytes Relative: 8 %
Neutro Abs: 3.8 10*3/uL (ref 1.7–7.7)
Neutrophils Relative %: 67 %

## 2022-11-06 LAB — URINALYSIS, ROUTINE W REFLEX MICROSCOPIC
Bilirubin Urine: NEGATIVE
Glucose, UA: NEGATIVE mg/dL
Hgb urine dipstick: NEGATIVE
Ketones, ur: NEGATIVE mg/dL
Leukocytes,Ua: NEGATIVE
Nitrite: NEGATIVE
Protein, ur: NEGATIVE mg/dL
Specific Gravity, Urine: 1.011 (ref 1.005–1.030)
pH: 7 (ref 5.0–8.0)

## 2022-11-06 LAB — CBC
HCT: 38.9 % — ABNORMAL LOW (ref 39.0–52.0)
Hemoglobin: 12.7 g/dL — ABNORMAL LOW (ref 13.0–17.0)
MCH: 27.7 pg (ref 26.0–34.0)
MCHC: 32.6 g/dL (ref 30.0–36.0)
MCV: 84.7 fL (ref 80.0–100.0)
Platelets: 181 10*3/uL (ref 150–400)
RBC: 4.59 MIL/uL (ref 4.22–5.81)
RDW: 15 % (ref 11.5–15.5)
WBC: 5.7 10*3/uL (ref 4.0–10.5)
nRBC: 0 % (ref 0.0–0.2)

## 2022-11-06 LAB — RAPID URINE DRUG SCREEN, HOSP PERFORMED
Amphetamines: NOT DETECTED
Barbiturates: NOT DETECTED
Benzodiazepines: NOT DETECTED
Cocaine: NOT DETECTED
Opiates: NOT DETECTED
Tetrahydrocannabinol: NOT DETECTED

## 2022-11-06 LAB — LACTIC ACID, PLASMA
Lactic Acid, Venous: 1.1 mmol/L (ref 0.5–1.9)
Lactic Acid, Venous: 0.8 mmol/L (ref 0.5–1.9)

## 2022-11-06 LAB — APTT: aPTT: 34 seconds (ref 24–36)

## 2022-11-06 LAB — PROTIME-INR
INR: 1.2 (ref 0.8–1.2)
Prothrombin Time: 15.1 seconds (ref 11.4–15.2)

## 2022-11-06 LAB — TROPONIN I (HIGH SENSITIVITY)
Troponin I (High Sensitivity): 7 ng/L (ref <18)
Troponin I (High Sensitivity): 8 ng/L (ref <18)

## 2022-11-06 LAB — AMMONIA: Ammonia: 25 umol/L (ref 9–35)

## 2022-11-06 LAB — ETHANOL: Alcohol, Ethyl (B): <10 mg/dL (ref <10)

## 2022-11-06 MED ORDER — POTASSIUM CHLORIDE CRYS ER 20 MEQ PO TBCR
40.0000 meq | EXTENDED_RELEASE_TABLET | Freq: Once | ORAL | Status: AC
  Administered 2022-11-06: 40 meq via ORAL
  Filled 2022-11-06: qty 2

## 2022-11-06 NOTE — ED Notes (Signed)
Patient transported to CT 

## 2022-11-06 NOTE — ED Provider Notes (Signed)
[place] [facility] AT [facility] Provider Note   CSN: [id number] Arrival date & time: 11/06/22  1431     History  Chief Complaint  Patient presents with   Altered Mental Status   HPI Jeff Wilson is a 82 y.o. male with history of CVA, TIA, hypertension, dementia presenting for altered mental status.  Per EMS, was eating breakfast around 8 AM like he normally does.  About the 30 minutes later he became noncommunicative which is abnormal for him.  Staff at his assisted living facility called EMS.  EMS stated that he did express verbally and desire to use the bathroom.  During initial encounter here in the ED, patient was nonverbal and minimally responsive to painful stimuli.  Was intermittently moving his extremities.  EMS did not express any concern regarding recent infection.   Altered Mental Status      Home Medications Prior to Admission medications   Medication Sig Start Date End Date Taking? Authorizing Provider  acetaminophen (TYLENOL) 500 MG tablet Take 500 mg by mouth every 6 (six) hours as needed for mild pain.    [provider]  amLODipine (NORVASC) 10 MG tablet Take 10 mg by mouth daily.    [provider]  atorvastatin (LIPITOR) 20 MG tablet Take 60 mg by mouth daily. 11/05/21   [provider]  calcium carbonate (TUMS - DOSED IN MG ELEMENTAL CALCIUM) 500 MG chewable tablet Chew 1 tablet by mouth daily.    [provider]  clopidogrel (PLAVIX) 75 MG tablet Take 75 mg by mouth daily.    [provider]  ketoconazole (NIZORAL) 2 % shampoo Apply 1 Application topically. On Mondays every 2 weeks    [provider]  melatonin 3 MG TABS tablet Take 3 mg by mouth at bedtime.    [provider]  mirtazapine (REMERON) 15 MG tablet Take 15 mg by mouth at bedtime.    [provider]  Multiple Vitamin (MULTIVITAMIN WITH MINERALS) TABS tablet Take 1 tablet by mouth daily. 12/18/21    Standley Brooking, MD  Psyllium 400 MG CAPS Take 1 tablet by mouth daily.    [provider]  sennosides-docusate sodium (SENOKOT-S) 8.6-50 MG tablet Take 2 tablets by mouth daily.    [provider]  Vitamin D, Ergocalciferol, (DRISDOL) 1.25 MG (50000 UNIT) CAPS capsule Take 50,000 Units by mouth every Monday.    [provider]      Allergies    Patient has no known allergies.    Review of Systems   See HPI for pertinent positives   Physical Exam   Vitals:   11/06/22 2221 11/06/22 2222  BP:  116/79  Pulse: 80 80  Resp:  16  Temp:    SpO2: 99% 99%    CONSTITUTIONAL:  well-appearing, NAD NEURO: Nonverbal.  When asked questions, moans.  No obvious facial droop.  Moving extremities intermittently.  Unable to assess strength and sensation.  Patient not following commands. EYES:  eyes equal and reactive ENT/NECK:  Supple, no stridor  CARDIO:  regular rate and rhythm, appears well-perfused  PULM:  No respiratory distress, CTAB GI/GU:  non-distended, soft MSK/SPINE:  No gross deformities, no edema, moves all extremities  SKIN:  no rash, atraumatic  *Additional and/or pertinent findings included in MDM below   ED Results / Procedures / Treatments   Labs (all labs ordered are listed, but only abnormal results are displayed) Labs Reviewed  COMPREHENSIVE METABOLIC PANEL - Abnormal; Notable for  the following components:      Result Value   Potassium 3.1 (*)    Albumin 3.4 (*)    All other components within normal limits  URINALYSIS, ROUTINE W REFLEX MICROSCOPIC - Abnormal; Notable for the following components:   APPearance CLOUDY (*)    All other components within normal limits  CBC - Abnormal; Notable for the following components:   Hemoglobin 12.7 (*)    HCT 38.9 (*)    All other components within normal limits  I-STAT CHEM 8, ED - Abnormal; Notable for the following components:   Potassium 3.1 (*)    Calcium, Ion 1.09 (*)    All other  components within normal limits  CULTURE, BLOOD (ROUTINE X 2)  CULTURE, BLOOD (ROUTINE X 2)  PROTIME-INR  APTT  ETHANOL  RAPID URINE DRUG SCREEN, HOSP PERFORMED  DIFFERENTIAL  LACTIC ACID, PLASMA  LACTIC ACID, PLASMA  AMMONIA  TROPONIN I (HIGH SENSITIVITY)  TROPONIN I (HIGH SENSITIVITY)    EKG EKG Interpretation Date/Time:  Wednesday November 06 2022 15:21:55 EDT Ventricular Rate:  77 PR Interval:  187 QRS Duration:  98 QT Interval:  384 QTC Calculation: 435 R Axis:   -43  Text Interpretation: Sinus rhythm Ventricular premature complex Left anterior fascicular block Artifact Confirmed by Gerhard Munch [phone]) on 11/06/2022 4:29:11 PM  Radiology DG Abdomen 1 View  Result Date: 11/06/2022 CLINICAL DATA:  MRI screening. EXAM: ABDOMEN - 1 VIEW COMPARISON:  None Available. FINDINGS: No radiopaque foreign body identified. Multiple leads and wires overlie the chest and abdomen. The bowel gas pattern is normal. No radio-opaque calculi or other significant radiographic abnormality are seen. IMPRESSION: No radiopaque foreign body identified. Multiple leads and wires overlie the chest and abdomen. Electronically Signed   By: Darliss Cheney M.D.   On: 11/06/2022 20:39   DG Chest Portable 1 View  Result Date: 11/06/2022 CLINICAL DATA:  MRI screening EXAM: PORTABLE CHEST 1 VIEW COMPARISON:  None Available. FINDINGS: There is no radiopaque foreign body in the chest. The heart size and mediastinal contours are within normal limits. Both lungs are clear. The visualized skeletal structures are unremarkable. IMPRESSION: No active disease. Electronically Signed   By: Darliss Cheney M.D.   On: 11/06/2022 20:39   CT HEAD WO CONTRAST  Result Date: 11/06/2022 CLINICAL DATA:  Mental status change, unknown cause EXAM: CT HEAD WITHOUT CONTRAST TECHNIQUE: Contiguous axial images were obtained from the base of the skull through the vertex without intravenous contrast. RADIATION DOSE REDUCTION: This exam was  performed according to the departmental dose-optimization program which includes automated exposure control, adjustment of the mA and/or kV according to patient size and/or use of iterative reconstruction technique. COMPARISON:  05/22/2020 FINDINGS: Brain: No evidence of acute infarction, hemorrhage, hydrocephalus, extra-axial collection or mass lesion/mass effect. Stable degree of generalized atrophy. Advanced periventricular and deep white matter hypodensity typical of chronic small vessel ischemic change. Chronic ischemic changes and remote lacunar infarcts in the bilateral basal ganglia and left aspect of the pons. Vascular: No hyperdense vessel or unexpected calcification. Skull: No fracture or focal lesion. Sinuses/Orbits: Paranasal sinuses and mastoid air cells are clear. The visualized orbits are unremarkable. Other: None. IMPRESSION: 1. No acute intracranial abnormality. 2. Stable atrophy, chronic small vessel ischemic change, and remote lacunar infarcts. Electronically Signed   By: Narda Rutherford M.D.   On: 11/06/2022 15:35    Procedures Procedures    Medications Ordered in ED Medications  potassium chloride SA (KLOR-CON M) CR tablet 40 mEq (40  mEq Oral Given 11/06/22 1917)    ED Course/ Medical Decision Making/ A&P                             Medical Decision Making Amount and/or Complexity of Data Reviewed Labs: ordered. Radiology: ordered.  Risk Prescription drug management.   Initial Impression and Ddx 82 year old well-appearing male presenting for altered mental status. Exam notable for nonverbal behavior and minimal reaction to painful stimuli.  DDx includes stroke, sepsis, intracranial mass, ACS, PE, electrolyte derangement. Patient PMH that increases complexity of ED encounter:  history of CVA, TIA, hypertension, dementia  Interpretation of Diagnostics - I independent reviewed and interpreted the labs as followed: hypokalemia  - I independently visualized the  following imaging with scope of interpretation limited to determining acute life threatening conditions related to emergency care: CT head, which revealed no acute abnormalities. MRI brain pending.  - I personally reviewed and interpreted EKG which revealed sinus rhythm  Patient Reassessment and Ultimate Disposition/Management On reassessment, was reported that he started to communicate normally with staff.  Also mention to me that he "wanted some candy".  Nevertheless given the difficulty evaluating him from neurostandpoint, prompted further characterization with an MRI.  That study is pending.  Workup not suggestive of sepsis or cardiac etiology.  Signed out patient to Dr. Elayne Snare who will follow-up on the MRI.  Plan is if the MRI is reassuring then patient can discharge home with follow-up with PCP.  Patient management required discussion with the following services or consulting groups:  None  Complexity of Problems Addressed Acute complicated illness or Injury  Additional Data Reviewed and Analyzed Further history obtained from: Past medical history and medications listed in the EMR, Prior ED visit notes, and Recent discharge summary  Patient Encounter Risk Assessment Consideration of hospitalization         Final Clinical Impression(s) / ED Diagnoses Final diagnoses:  Altered mental status, unspecified altered mental status type    Rx / DC Orders ED Discharge Orders     None         Gareth Eagle, PA-C 11/06/22 2230    Elayne Snare K, DO 11/06/22 2339

## 2022-11-06 NOTE — ED Triage Notes (Signed)
Pt BIBEMS from [facility] for cc of AMS. Per staff LKW is 8am today, pt was at baseline and ate breakfast normal but never came down for lunch. Staff went to check on pt and was found to be altered and not verbalizing anything. 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 Pt vocalized to EMS that he needed to use the bathroom but remained non-verbal upon arrival to ED.   BG 95  Bp 140/70 98% RA 80 HR

## 2022-11-06 NOTE — ED Notes (Signed)
Report called to [facility]. 

## 2022-11-06 NOTE — Discharge Instructions (Signed)
You were seen in the emergency department for your episode of confusion and not speaking.  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  They go away on their own and there is no specific treatment.  You can follow-up with the neurologist as an outpatient for further evaluation and management.  You should return to the emergency department if you are having numbness or weakness on one side of the body compared to the other, you have slurred speech, you are drowsy and hard to wake up or if you have any other new or concerning symptoms.

## 2022-11-11 LAB — CULTURE, BLOOD (ROUTINE X 2): Culture: NO GROWTH

## 2023-02-19 ENCOUNTER — Other Ambulatory Visit: Payer: Self-pay

## 2023-02-19 ENCOUNTER — Emergency Department (HOSPITAL_COMMUNITY): Payer: Medicare HMO

## 2023-02-19 ENCOUNTER — Ambulatory Visit (INDEPENDENT_AMBULATORY_CARE_PROVIDER_SITE_OTHER): Payer: Self-pay | Admitting: Diagnostic Neuroimaging

## 2023-02-19 ENCOUNTER — Inpatient Hospital Stay (HOSPITAL_COMMUNITY)
Admission: EM | Admit: 2023-02-19 | Discharge: 2023-02-22 | DRG: 871 | Disposition: A | Discharge status: Skilled Nursing Facility | Payer: Medicare HMO | Source: Skilled Nursing Facility | Attending: Family Medicine | Admitting: Family Medicine

## 2023-02-19 ENCOUNTER — Encounter: Payer: Self-pay | Admitting: Diagnostic Neuroimaging

## 2023-02-19 VITALS — BP 78/53 | HR 77 | Ht 69.0 in | Wt 158.2 lb

## 2023-02-19 DIAGNOSIS — I9589 Other hypotension: Secondary | ICD-10-CM

## 2023-02-19 DIAGNOSIS — A419 Sepsis, unspecified organism: Principal | ICD-10-CM

## 2023-02-19 DIAGNOSIS — Z8673 Personal history of transient ischemic attack (TIA), and cerebral infarction without residual deficits: Secondary | ICD-10-CM

## 2023-02-19 DIAGNOSIS — I959 Hypotension, unspecified: Secondary | ICD-10-CM | POA: Diagnosis present

## 2023-02-19 DIAGNOSIS — I444 Left anterior fascicular block: Secondary | ICD-10-CM | POA: Diagnosis present

## 2023-02-19 DIAGNOSIS — I10 Essential (primary) hypertension: Secondary | ICD-10-CM | POA: Diagnosis present

## 2023-02-19 DIAGNOSIS — Z87891 Personal history of nicotine dependence: Secondary | ICD-10-CM

## 2023-02-19 DIAGNOSIS — Z7902 Long term (current) use of antithrombotics/antiplatelets: Secondary | ICD-10-CM | POA: Diagnosis not present

## 2023-02-19 DIAGNOSIS — R6520 Severe sepsis without septic shock: Secondary | ICD-10-CM | POA: Diagnosis present

## 2023-02-19 DIAGNOSIS — Z993 Dependence on wheelchair: Secondary | ICD-10-CM | POA: Diagnosis not present

## 2023-02-19 DIAGNOSIS — E785 Hyperlipidemia, unspecified: Secondary | ICD-10-CM | POA: Diagnosis present

## 2023-02-19 DIAGNOSIS — R5381 Other malaise: Secondary | ICD-10-CM | POA: Diagnosis present

## 2023-02-19 DIAGNOSIS — Z1152 Encounter for screening for COVID-19: Secondary | ICD-10-CM | POA: Diagnosis not present

## 2023-02-19 DIAGNOSIS — E876 Hypokalemia: Secondary | ICD-10-CM

## 2023-02-19 DIAGNOSIS — R531 Weakness: Secondary | ICD-10-CM

## 2023-02-19 DIAGNOSIS — R4182 Altered mental status, unspecified: Principal | ICD-10-CM

## 2023-02-19 DIAGNOSIS — G9341 Metabolic encephalopathy: Secondary | ICD-10-CM | POA: Diagnosis present

## 2023-02-19 DIAGNOSIS — F0390 Unspecified dementia without behavioral disturbance: Secondary | ICD-10-CM | POA: Diagnosis present

## 2023-02-19 DIAGNOSIS — E872 Acidosis, unspecified: Secondary | ICD-10-CM | POA: Diagnosis present

## 2023-02-19 DIAGNOSIS — E861 Hypovolemia: Secondary | ICD-10-CM | POA: Diagnosis not present

## 2023-02-19 DIAGNOSIS — N390 Urinary tract infection, site not specified: Secondary | ICD-10-CM | POA: Diagnosis present

## 2023-02-19 DIAGNOSIS — Z79899 Other long term (current) drug therapy: Secondary | ICD-10-CM

## 2023-02-19 DIAGNOSIS — G459 Transient cerebral ischemic attack, unspecified: Secondary | ICD-10-CM | POA: Diagnosis present

## 2023-02-19 LAB — COMPREHENSIVE METABOLIC PANEL
ALT: 16 U/L (ref 0–44)
AST: 24 U/L (ref 15–41)
Albumin: 3.4 g/dL — ABNORMAL LOW (ref 3.5–5.0)
Alkaline Phosphatase: 136 U/L — ABNORMAL HIGH (ref 38–126)
Anion gap: 13 (ref 5–15)
BUN: 12 mg/dL (ref 8–23)
CO2: 25 mmol/L (ref 22–32)
Calcium: 9.7 mg/dL (ref 8.9–10.3)
Chloride: 105 mmol/L (ref 98–111)
Creatinine, Ser: 0.96 mg/dL (ref 0.61–1.24)
GFR, Estimated: >60 mL/min (ref >60)
Glucose, Bld: 107 mg/dL — ABNORMAL HIGH (ref 70–99)
Potassium: 3.2 mmol/L — ABNORMAL LOW (ref 3.5–5.1)
Sodium: 143 mmol/L (ref 135–145)
Total Bilirubin: 1.1 mg/dL (ref 0.3–1.2)
Total Protein: 7.3 g/dL (ref 6.5–8.1)

## 2023-02-19 LAB — CBC WITH DIFFERENTIAL/PLATELET
Abs Immature Granulocytes: 0.06 10*3/uL (ref 0.00–0.07)
Basophils Absolute: 0 10*3/uL (ref 0.0–0.1)
Basophils Relative: 0 %
Eosinophils Absolute: 0 10*3/uL (ref 0.0–0.5)
Eosinophils Relative: 0 %
HCT: 37.8 % — ABNORMAL LOW (ref 39.0–52.0)
Hemoglobin: 12.2 g/dL — ABNORMAL LOW (ref 13.0–17.0)
Immature Granulocytes: 1 %
Lymphocytes Relative: 7 %
Lymphs Abs: 0.7 10*3/uL (ref 0.7–4.0)
MCH: 27.6 pg (ref 26.0–34.0)
MCHC: 32.3 g/dL (ref 30.0–36.0)
MCV: 85.5 fL (ref 80.0–100.0)
Monocytes Absolute: 0.4 10*3/uL (ref 0.1–1.0)
Monocytes Relative: 4 %
Neutro Abs: 9.2 10*3/uL — ABNORMAL HIGH (ref 1.7–7.7)
Neutrophils Relative %: 88 %
Platelets: 185 10*3/uL (ref 150–400)
RBC: 4.42 MIL/uL (ref 4.22–5.81)
RDW: 15.2 % (ref 11.5–15.5)
WBC: 10.4 10*3/uL (ref 4.0–10.5)
nRBC: 0 % (ref 0.0–0.2)

## 2023-02-19 LAB — URINALYSIS, W/ REFLEX TO CULTURE (INFECTION SUSPECTED)
Bilirubin Urine: NEGATIVE
Glucose, UA: NEGATIVE mg/dL
Ketones, ur: NEGATIVE mg/dL
Nitrite: NEGATIVE
Protein, ur: NEGATIVE mg/dL
Specific Gravity, Urine: 1.015 (ref 1.005–1.030)
WBC, UA: >50 WBC/hpf (ref 0–5)
pH: 6 (ref 5.0–8.0)

## 2023-02-19 LAB — I-STAT CG4 LACTIC ACID, ED
Lactic Acid, Venous: 2.4 mmol/L (ref 0.5–1.9)
Lactic Acid, Venous: 2.3 mmol/L (ref 0.5–1.9)

## 2023-02-19 LAB — RESP PANEL BY RT-PCR (RSV, FLU A&B, COVID)  RVPGX2
Influenza A by PCR: NEGATIVE
Influenza B by PCR: NEGATIVE
Resp Syncytial Virus by PCR: NEGATIVE
SARS Coronavirus 2 by RT PCR: NEGATIVE

## 2023-02-19 MED ORDER — VANCOMYCIN HCL 1500 MG/300ML IV SOLN
1500.0000 mg | Freq: Once | INTRAVENOUS | Status: AC
  Administered 2023-02-19: 1500 mg via INTRAVENOUS
  Filled 2023-02-19: qty 300

## 2023-02-19 MED ORDER — POLYETHYLENE GLYCOL 3350 17 G PO PACK
17.0000 g | PACK | Freq: Every day | ORAL | Status: DC | PRN
Start: 2023-02-19 — End: 2023-02-22

## 2023-02-19 MED ORDER — ALBUTEROL SULFATE (2.5 MG/3ML) 0.083% IN NEBU: 2.5000 mg | INHALATION_SOLUTION | RESPIRATORY_TRACT | Status: DC | PRN

## 2023-02-19 MED ORDER — ONDANSETRON HCL 4 MG/2ML IJ SOLN: 4.0000 mg | Freq: Four times a day (QID) | INTRAMUSCULAR | Status: DC | PRN

## 2023-02-19 MED ORDER — ACETAMINOPHEN 650 MG RE SUPP: 650.0000 mg | Freq: Four times a day (QID) | RECTAL | Status: DC | PRN

## 2023-02-19 MED ORDER — SODIUM CHLORIDE 0.9 % IV SOLN
1.0000 g | INTRAVENOUS | Status: DC
  Administered 2023-02-19 – 2023-02-21 (×3): 1 g via INTRAVENOUS
  Filled 2023-02-19 (×3): qty 10

## 2023-02-19 MED ORDER — SODIUM CHLORIDE 0.9 % IV SOLN
2.0000 g | Freq: Once | INTRAVENOUS | Status: AC
  Administered 2023-02-19: 2 g via INTRAVENOUS
  Filled 2023-02-19: qty 12.5

## 2023-02-19 MED ORDER — METRONIDAZOLE 500 MG/100ML IV SOLN
500.0000 mg | Freq: Once | INTRAVENOUS | Status: AC
  Administered 2023-02-19: 500 mg via INTRAVENOUS
  Filled 2023-02-19: qty 100

## 2023-02-19 MED ORDER — ATORVASTATIN CALCIUM 40 MG PO TABS
40.0000 mg | ORAL_TABLET | Freq: Every day | ORAL | Status: DC
  Administered 2023-02-19 – 2023-02-22 (×4): 40 mg via ORAL
  Filled 2023-02-19 (×4): qty 1

## 2023-02-19 MED ORDER — CLOPIDOGREL BISULFATE 75 MG PO TABS
75.0000 mg | ORAL_TABLET | Freq: Every day | ORAL | Status: DC
  Administered 2023-02-19 – 2023-02-22 (×4): 75 mg via ORAL
  Filled 2023-02-19 (×4): qty 1

## 2023-02-19 MED ORDER — ACETAMINOPHEN 325 MG PO TABS: 650.0000 mg | ORAL_TABLET | Freq: Four times a day (QID) | ORAL | Status: DC | PRN

## 2023-02-19 MED ORDER — SODIUM CHLORIDE 0.9 % IV SOLN: INTRAVENOUS | Status: AC

## 2023-02-19 MED ORDER — LACTATED RINGERS IV BOLUS (SEPSIS)
1000.0000 mL | Freq: Once | INTRAVENOUS | Status: AC
  Administered 2023-02-19: 1000 mL via INTRAVENOUS

## 2023-02-19 MED ORDER — POTASSIUM CHLORIDE 20 MEQ PO PACK
40.0000 meq | PACK | Freq: Once | ORAL | Status: AC
  Administered 2023-02-19: 40 meq via ORAL
  Filled 2023-02-19: qty 2

## 2023-02-19 MED ORDER — ADULT MULTIVITAMIN W/MINERALS CH
1.0000 | ORAL_TABLET | Freq: Every day | ORAL | Status: DC
Start: 2023-02-19 — End: 2023-02-22
  Administered 2023-02-19 – 2023-02-22 (×4): 1 via ORAL
  Filled 2023-02-19 (×4): qty 1

## 2023-02-19 MED ORDER — POLYETHYLENE GLYCOL 3350 17 G PO PACK
17.0000 g | PACK | Freq: Every day | ORAL | Status: DC
  Administered 2023-02-19 – 2023-02-22 (×4): 17 g via ORAL
  Filled 2023-02-19 (×4): qty 1

## 2023-02-19 MED ORDER — ENOXAPARIN SODIUM 40 MG/0.4ML IJ SOSY
40.0000 mg | PREFILLED_SYRINGE | INTRAMUSCULAR | Status: DC
Start: 2023-02-19 — End: 2023-02-22
  Administered 2023-02-19 – 2023-02-21 (×3): 40 mg via SUBCUTANEOUS
  Filled 2023-02-19 (×3): qty 0.4

## 2023-02-19 MED ORDER — SENNOSIDES-DOCUSATE SODIUM 8.6-50 MG PO TABS
2.0000 | ORAL_TABLET | Freq: Every day | ORAL | Status: DC
  Administered 2023-02-19 – 2023-02-22 (×4): 2 via ORAL
  Filled 2023-02-19 (×4): qty 2

## 2023-02-19 MED ORDER — VANCOMYCIN HCL IN DEXTROSE 1-5 GM/200ML-% IV SOLN: 1000.0000 mg | Freq: Once | INTRAVENOUS | Status: DC

## 2023-02-19 MED ORDER — ONDANSETRON HCL 4 MG PO TABS: 4.0000 mg | ORAL_TABLET | Freq: Four times a day (QID) | ORAL | Status: DC | PRN

## 2023-02-19 MED ORDER — LACTATED RINGERS IV BOLUS (SEPSIS)
250.0000 mL | Freq: Once | INTRAVENOUS | Status: AC
  Administered 2023-02-19: 250 mL via INTRAVENOUS

## 2023-02-19 NOTE — ED Provider Notes (Signed)
Ultrasound ED Peripheral IV (Provider)  Date/Time: 02/19/2023 1:18 PM  Performed by: Olene Floss, PA-C Authorized by: Olene Floss, PA-C   Procedure details:    Indications: poor IV access     Skin Prep: chlorhexidine gluconate     Location:  Left AC   Angiocath:  18 G   Bedside Ultrasound Guided: Yes     Images: not archived     Patient tolerated procedure without complications: Yes     Dressing applied: Yes   Comments:     Sepsis, needed second line  Patient septic, needed second IV access, nursing staff unable to obtain IV, and I was able to obtain a second line in the left [facility] with ultrasound guidance as described above.   Olene Floss, PA-C 02/19/23 1319    Cathren Laine, MD 02/24/23 1842

## 2023-02-19 NOTE — Progress Notes (Signed)
RN was at bed side to perform in and out cath and patient did urine about 200 ml. After one hour RN did the bladder scan and same time patient started to pee. It was about 100 ml. Bladder volume was 60 ml after pee. Made the provider aware Will continue to monitor

## 2023-02-19 NOTE — ED Notes (Signed)
ED TO INPATIENT HANDOFF REPORT  ED Nurse Name and Phone #: Murlean Iba Paramedic [phone]  S Name/Age/Gender Jeff Wilson 82 y.o. male Room/Bed: 019C/019C  Code Status   Code Status: Prior  Home/SNF/Other Skilled nursing facility Patient oriented to: self Is this baseline? Yes   Triage Complete: Triage complete  Chief Complaint Hypotension [I95.9]  Triage Note Pt BIB [facility] from [facility] due to altered mental status.  Pt currently SNF is [facility].  Hypotension  at [facility] 78/52.  EMS BP 100/60.  Pt clutching at his chest en route.  12 lead unremarkable.  HR 56-80.  Hx dementia & stroke.  CBG 150.   Allergies No Known Allergies  Level of Care/Admitting Diagnosis ED Disposition     ED Disposition  Admit   Condition  --   Comment  Hospital Area: [facility] [[id number]]  Level of Care: Telemetry Medical [104]  May admit patient to [facility] or [facility] if equivalent level of care is available:: Yes  Covid Evaluation: Asymptomatic - no recent exposure (last 10 days) testing not required  Diagnosis: Hypotension [086578]  Admitting Physician: Maretta Bees [[id number]]  Attending Physician: Maretta Bees [[id number]]  Certification:: I certify this patient will need inpatient services for at least 2 midnights  Expected Medical Readiness: 02/21/2023          B Medical/Surgery History Past Medical History:  Diagnosis Date   Dementia (HCC)    Hyperlipemia    TIA (transient ischemic attack)    No past surgical history on file.   A IV Location/Drains/Wounds Patient Lines/Drains/Airways Status     Active Line/Drains/Airways     Name Placement date Placement time Site Days   Peripheral IV 02/19/23 20 G 1" Anterior;Right Forearm 02/19/23  1300  Forearm  less than 1   Peripheral IV 02/19/23 18 G 1.16" Anterior;Left;Proximal Forearm 02/19/23  1323  Forearm  less than 1            Intake/Output Last 24  hours  Intake/Output Summary (Last 24 hours) at 02/19/2023 1628 Last data filed at 02/19/2023 1628 Gross per 24 hour  Intake 2750 ml  Output --  Net 2750 ml    Labs/Imaging Results for orders placed or performed during the hospital encounter of 02/19/23 (from the past 48 hour(s))  Resp panel by RT-PCR (RSV, Flu A&B, Covid) Anterior Nasal Swab     Status: None   Collection Time: 02/19/23 12:52 PM   Specimen: Anterior Nasal Swab  Result Value Ref Range   SARS Coronavirus 2 by RT PCR NEGATIVE NEGATIVE   Influenza A by PCR NEGATIVE NEGATIVE   Influenza B by PCR NEGATIVE NEGATIVE    Comment: (NOTE) The Xpert Xpress SARS-CoV-2/FLU/RSV plus assay is intended as an aid in the diagnosis of influenza from Nasopharyngeal swab specimens and should not be used as a sole basis for treatment. Nasal washings and aspirates are unacceptable for Xpert Xpress SARS-CoV-2/FLU/RSV testing.  Fact Sheet for Patients: BloggerCourse.com  Fact Sheet for Healthcare Providers: SeriousBroker.it  This test is not yet approved or cleared by the [place] FDA and has been authorized for detection and/or diagnosis of SARS-CoV-2 by FDA under an Emergency Use Authorization (EUA). This EUA will remain in effect (meaning this test can be used) for the duration of the COVID-19 declaration under Section 564(b)(1) of the Act, 21 U.S.C. section 360bbb-3(b)(1), unless the authorization is terminated or revoked.     Resp Syncytial Virus by PCR  NEGATIVE NEGATIVE    Comment: (NOTE) Fact Sheet for Patients: BloggerCourse.com  Fact Sheet for Healthcare Providers: SeriousBroker.it  This test is not yet approved or cleared by the [place] FDA and has been authorized for detection and/or diagnosis of SARS-CoV-2 by FDA under an Emergency Use Authorization (EUA). This EUA will remain in effect (meaning this test  can be used) for the duration of the COVID-19 declaration under Section 564(b)(1) of the Act, 21 U.S.C. section 360bbb-3(b)(1), unless the authorization is terminated or revoked.  Performed at [facility], [address], [place], [place] [address]   Comprehensive metabolic panel     Status: Abnormal   Collection Time: 02/19/23 12:52 PM  Result Value Ref Range   Sodium 143 135 - 145 mmol/L   Potassium 3.2 (L) 3.5 - 5.1 mmol/L   Chloride 105 98 - 111 mmol/L   CO2 25 22 - 32 mmol/L   Glucose, Bld 107 (H) 70 - 99 mg/dL    Comment: Glucose reference range applies only to samples taken after fasting for at least 8 hours.   BUN 12 8 - 23 mg/dL   Creatinine, Ser 6.04 0.61 - 1.24 mg/dL   Calcium 9.7 8.9 - 54.0 mg/dL   Total Protein 7.3 6.5 - 8.1 g/dL   Albumin 3.4 (L) 3.5 - 5.0 g/dL   AST 24 15 - 41 U/L   ALT 16 0 - 44 U/L   Alkaline Phosphatase 136 (H) 38 - 126 U/L   Total Bilirubin 1.1 0.3 - 1.2 mg/dL   GFR, Estimated >98 >11 mL/min    Comment: (NOTE) Calculated using the CKD-EPI Creatinine Equation (2021)    Anion gap 13 5 - 15    Comment: Performed at [facility], [address], [place], [place] [address]  CBC with Differential     Status: Abnormal   Collection Time: 02/19/23 12:52 PM  Result Value Ref Range   WBC 10.4 4.0 - 10.5 K/uL   RBC 4.42 4.22 - 5.81 MIL/uL   Hemoglobin 12.2 (L) 13.0 - 17.0 g/dL   HCT 29.5 (L) 62.1 - 30.8 %   MCV 85.5 80.0 - 100.0 fL   MCH 27.6 26.0 - 34.0 pg   MCHC 32.3 30.0 - 36.0 g/dL   RDW 65.7 84.6 - 96.2 %   Platelets 185 150 - 400 K/uL   nRBC 0.0 0.0 - 0.2 %   Neutrophils Relative % 88 %   Neutro Abs 9.2 (H) 1.7 - 7.7 K/uL   Lymphocytes Relative 7 %   Lymphs Abs 0.7 0.7 - 4.0 K/uL   Monocytes Relative 4 %   Monocytes Absolute 0.4 0.1 - 1.0 K/uL   Eosinophils Relative 0 %   Eosinophils Absolute 0.0 0.0 - 0.5 K/uL   Basophils Relative 0 %   Basophils Absolute 0.0 0.0 - 0.1 K/uL   Immature Granulocytes 1 %   Abs Immature  Granulocytes 0.06 0.00 - 0.07 K/uL    Comment: Performed at [facility], [address] [place], [place] [address]  I-Stat Lactic Acid, ED     Status: Abnormal   Collection Time: 02/19/23  1:03 PM  Result Value Ref Range   Lactic Acid, Venous 2.4 (HH) 0.5 - 1.9 mmol/L   Comment NOTIFIED PHYSICIAN   Urinalysis, w/ Reflex to Culture (Infection Suspected) -Urine, Catheterized     Status: Abnormal   Collection Time: 02/19/23  1:39 PM  Result Value Ref Range   Specimen Source URINE,  CATHETERIZED    Color, Urine AMBER (A) YELLOW    Comment: BIOCHEMICALS MAY BE AFFECTED BY COLOR   APPearance CLOUDY (A) CLEAR   Specific Gravity, Urine 1.015 1.005 - 1.030   pH 6.0 5.0 - 8.0   Glucose, UA NEGATIVE NEGATIVE mg/dL   Hgb urine dipstick MODERATE (A) NEGATIVE   Bilirubin Urine NEGATIVE NEGATIVE   Ketones, ur NEGATIVE NEGATIVE mg/dL   Protein, ur NEGATIVE NEGATIVE mg/dL   Nitrite NEGATIVE NEGATIVE   Leukocytes,Ua LARGE (A) NEGATIVE   RBC / HPF 11-20 0 - 5 RBC/hpf   WBC, UA >50 0 - 5 WBC/hpf    Comment:        Reflex urine culture not performed if WBC <=10, OR if Squamous epithelial cells >5. If Squamous epithelial cells >5 suggest recollection.    Bacteria, UA FEW (A) NONE SEEN   Squamous Epithelial / HPF 0-5 0 - 5 /HPF   WBC Clumps PRESENT    Mucus PRESENT    Non Squamous Epithelial 0-5 (A) NONE SEEN    Comment: Performed at [facility], [address] [place], [place] [address]  I-Stat Lactic Acid, ED     Status: Abnormal   Collection Time: 02/19/23  2:48 PM  Result Value Ref Range   Lactic Acid, Venous 2.3 (HH) 0.5 - 1.9 mmol/L   Comment NOTIFIED PHYSICIAN    DG Chest Port 1 View  Result Date: 02/19/2023 CLINICAL DATA:  Sepsis EXAM: PORTABLE CHEST 1 VIEW COMPARISON:  11/06/2022 FINDINGS: Underinflation. Normal cardiopericardial silhouette. Overlapping cardiac leads. Degenerative changes of the spine. Subtle hazy opacity in the left lung base. Mild infiltrates  possible. Recommend follow-up. No pneumothorax, edema or effusion. IMPRESSION: Underinflation with questionable mild opacity left lung base. Recommend follow-up Electronically Signed   By: Karen Kays M.D.   On: 02/19/2023 15:18    Pending Labs Unresulted Labs (From admission, onward)     Start     Ordered   02/19/23 1339  Urine Culture  Once,   R        02/19/23 1339   02/19/23 1207  Blood Culture (routine x 2)  (Septic presentation on arrival (screening labs, nursing and treatment orders for obvious sepsis))  BLOOD CULTURE X 2,   STAT      02/19/23 1207            Vitals/Pain Today's Vitals   02/19/23 1445 02/19/23 1500 02/19/23 1610 02/19/23 1618  BP: 127/73 131/74 123/71   Pulse: 78 83 82   Resp: 18 15 16    Temp:    98.4 F (36.9 C)  TempSrc:      SpO2: 100% 100% 100%   Weight:      Height:        Isolation Precautions No active isolations  Medications Medications  lactated ringers bolus 1,000 mL (0 mLs Intravenous Stopped 02/19/23 1337)    And  lactated ringers bolus 1,000 mL (0 mLs Intravenous Stopped 02/19/23 1422)    And  lactated ringers bolus 250 mL (0 mLs Intravenous Stopped 02/19/23 1338)  ceFEPIme (MAXIPIME) 2 g in sodium chloride 0.9 % 100 mL IVPB (0 g Intravenous Stopped 02/19/23 1338)  metroNIDAZOLE (FLAGYL) IVPB 500 mg (0 mg Intravenous Stopped 02/19/23 1422)  vancomycin (VANCOREADY) IVPB 1500 mg/300 mL (0 mg Intravenous Stopped 02/19/23 1628)  potassium chloride (KLOR-CON) packet 40 mEq (40 mEq Oral Given 02/19/23 1420)    Mobility non-ambulatory     Focused Assessments   R Recommendations: See Admitting  Provider Note  Report given to:   Additional Notes:

## 2023-02-19 NOTE — Progress Notes (Signed)
Patient transferred from ED via bed at 1645 PM. Alert and oriented. On RA. Will continue to monitor

## 2023-02-19 NOTE — Progress Notes (Signed)
ED Pharmacy Antibiotic Sign Off An antibiotic consult was received from an ED provider for vancomycin and cefepime per pharmacy dosing for sepsis. A chart review was completed to assess appropriateness.   The following one time order(s) were placed:  Vancomycin 1500mg  IV x 1 Cefepime 2g IV x 1 Flagyl 500mg  IV x 1  Further antibiotic and/or antibiotic pharmacy consults should be ordered by the admitting provider if indicated.   Thank you for allowing pharmacy to be a part of this patient's care.   Saad Buhl, Drake Leach, [place]  Clinical Pharmacist 02/19/23 12:18 PM

## 2023-02-19 NOTE — ED Provider Notes (Signed)
[facility] Provider Note   CSN: [id number] Arrival date & time: 02/19/23  1158     History  Chief Complaint  Patient presents with   Altered Mental Status    Jeff Wilson is a 82 y.o. male.  Pt from neurology office, where patient was noted to appear generally weak, with altered mental status and low blood pressure and was sent to ED.  Pt not verbally responsive on arrival - level 5 caveat. CBG 150. No report of trauma/fall. No report of fever.   The history is provided by the patient, the EMS personnel and medical records. The history is limited by the condition of the patient.  Altered Mental Status      Home Medications Prior to Admission medications   Medication Sig Start Date End Date Taking? Authorizing Provider  amLODipine (NORVASC) 10 MG tablet Take 10 mg by mouth daily.   Yes [provider]  atorvastatin (LIPITOR) 40 MG tablet Take 40 mg by mouth daily. 02/18/23  Yes [provider]  calcium carbonate (TUMS - DOSED IN MG ELEMENTAL CALCIUM) 500 MG chewable tablet Chew 1 tablet by mouth daily.   Yes [provider]  clopidogrel (PLAVIX) 75 MG tablet Take 75 mg by mouth daily.   Yes [provider]  ketoconazole (NIZORAL) 2 % shampoo Apply 1 Application topically. On Mondays every 2 weeks   Yes [provider]  Multiple Vitamin (MULTIVITAMIN WITH MINERALS) TABS tablet Take 1 tablet by mouth daily. 12/18/21  Yes Standley Brooking, MD  polyethylene glycol (MIRALAX / GLYCOLAX) 17 g packet Take 17 g by mouth daily.   Yes [provider]  sennosides-docusate sodium (SENOKOT-S) 8.6-50 MG tablet Take 2 tablets by mouth daily.   Yes [provider]  Vitamin D, Ergocalciferol, (DRISDOL) 1.25 MG (50000 UNIT) CAPS capsule Take 50,000 Units by mouth every Monday.   Yes [provider]      Allergies    Patient has no known allergies.    Review of Systems    Review of Systems  Unable to perform ROS: Patient unresponsive    Physical Exam Updated Vital Signs BP 131/74   Pulse 83   Temp 98.4 F (36.9 C) (Oral)   Resp 15   Ht 1.753 m (5\' 9" )   Wt 71.7 kg   SpO2 100%   BMI 23.33 kg/m  Physical Exam Vitals and nursing note reviewed.  Constitutional:      Appearance: Normal appearance. He is well-developed.  HENT:     Head: Atraumatic.     Nose: Nose normal.     Mouth/Throat:     Mouth: Mucous membranes are moist.     Pharynx: Oropharynx is clear.  Eyes:     General: No scleral icterus.    Conjunctiva/sclera: Conjunctivae normal.     Pupils: Pupils are equal, round, and reactive to light.  Neck:     Vascular: No carotid bruit.     Trachea: No tracheal deviation.     Comments: No stiffness or rigidity.  Cardiovascular:     Rate and Rhythm: Normal rate and regular rhythm.     Pulses: Normal pulses.     Heart sounds: Normal heart sounds. No murmur heard.    No friction rub. No gallop.  Pulmonary:     Effort: Pulmonary effort is normal. No accessory muscle usage or respiratory distress.     Breath sounds: Normal breath sounds.  Abdominal:  General: Bowel sounds are normal. There is no distension.     Palpations: Abdomen is soft. There is no mass.     Tenderness: There is no abdominal tenderness. There is no guarding.  Genitourinary:    Comments: No cva tenderness. Musculoskeletal:        General: No swelling or tenderness.     Cervical back: Normal range of motion and neck supple. No rigidity or tenderness.     Right lower leg: No edema.     Left lower leg: No edema.  Skin:    General: Skin is warm and dry.     Findings: No rash.  Neurological:     Mental Status: He is alert.     Comments: Sitting upright, alert appear. Moves bil ext purposefully.      ED Results / Procedures / Treatments   Labs (all labs ordered are listed, but only abnormal results are displayed) Results for orders placed or performed during  the hospital encounter of 02/19/23  Comprehensive metabolic panel  Result Value Ref Range   Sodium 143 135 - 145 mmol/L   Potassium 3.2 (L) 3.5 - 5.1 mmol/L   Chloride 105 98 - 111 mmol/L   CO2 25 22 - 32 mmol/L   Glucose, Bld 107 (H) 70 - 99 mg/dL   BUN 12 8 - 23 mg/dL   Creatinine, Ser 7.56 0.61 - 1.24 mg/dL   Calcium 9.7 8.9 - 43.3 mg/dL   Total Protein 7.3 6.5 - 8.1 g/dL   Albumin 3.4 (L) 3.5 - 5.0 g/dL   AST 24 15 - 41 U/L   ALT 16 0 - 44 U/L   Alkaline Phosphatase 136 (H) 38 - 126 U/L   Total Bilirubin 1.1 0.3 - 1.2 mg/dL   GFR, Estimated >29 >51 mL/min   Anion gap 13 5 - 15  CBC with Differential  Result Value Ref Range   WBC 10.4 4.0 - 10.5 K/uL   RBC 4.42 4.22 - 5.81 MIL/uL   Hemoglobin 12.2 (L) 13.0 - 17.0 g/dL   HCT 88.4 (L) 16.6 - 06.3 %   MCV 85.5 80.0 - 100.0 fL   MCH 27.6 26.0 - 34.0 pg   MCHC 32.3 30.0 - 36.0 g/dL   RDW 01.6 01.0 - 93.2 %   Platelets 185 150 - 400 K/uL   nRBC 0.0 0.0 - 0.2 %   Neutrophils Relative % 88 %   Neutro Abs 9.2 (H) 1.7 - 7.7 K/uL   Lymphocytes Relative 7 %   Lymphs Abs 0.7 0.7 - 4.0 K/uL   Monocytes Relative 4 %   Monocytes Absolute 0.4 0.1 - 1.0 K/uL   Eosinophils Relative 0 %   Eosinophils Absolute 0.0 0.0 - 0.5 K/uL   Basophils Relative 0 %   Basophils Absolute 0.0 0.0 - 0.1 K/uL   Immature Granulocytes 1 %   Abs Immature Granulocytes 0.06 0.00 - 0.07 K/uL  Urinalysis, w/ Reflex to Culture (Infection Suspected) -Urine, Catheterized  Result Value Ref Range   Specimen Source URINE, CATHETERIZED    Color, Urine AMBER (A) YELLOW   APPearance CLOUDY (A) CLEAR   Specific Gravity, Urine 1.015 1.005 - 1.030   pH 6.0 5.0 - 8.0   Glucose, UA NEGATIVE NEGATIVE mg/dL   Hgb urine dipstick MODERATE (A) NEGATIVE   Bilirubin Urine NEGATIVE NEGATIVE   Ketones, ur NEGATIVE NEGATIVE mg/dL   Protein, ur NEGATIVE NEGATIVE mg/dL   Nitrite NEGATIVE NEGATIVE   Leukocytes,Ua LARGE (A) NEGATIVE  RBC / HPF 11-20 0 - 5 RBC/hpf   WBC, UA >50  0 - 5 WBC/hpf   Bacteria, UA FEW (A) NONE SEEN   Squamous Epithelial / HPF 0-5 0 - 5 /HPF   WBC Clumps PRESENT    Mucus PRESENT    Non Squamous Epithelial 0-5 (A) NONE SEEN  I-Stat Lactic Acid, ED  Result Value Ref Range   Lactic Acid, Venous 2.4 (HH) 0.5 - 1.9 mmol/L   Comment NOTIFIED PHYSICIAN   I-Stat Lactic Acid, ED  Result Value Ref Range   Lactic Acid, Venous 2.3 (HH) 0.5 - 1.9 mmol/L   Comment NOTIFIED PHYSICIAN    DG Chest Port 1 View  Result Date: 02/19/2023 CLINICAL DATA:  Sepsis EXAM: PORTABLE CHEST 1 VIEW COMPARISON:  11/06/2022 FINDINGS: Underinflation. Normal cardiopericardial silhouette. Overlapping cardiac leads. Degenerative changes of the spine. Subtle hazy opacity in the left lung base. Mild infiltrates possible. Recommend follow-up. No pneumothorax, edema or effusion. IMPRESSION: Underinflation with questionable mild opacity left lung base. Recommend follow-up Electronically Signed   By: Karen Kays M.D.   On: 02/19/2023 15:18     EKG EKG Interpretation Date/Time:  Wednesday February 19 2023 13:25:00 EDT Ventricular Rate:  80 PR Interval:  148 QRS Duration:  118 QT Interval:  419 QTC Calculation: 484 R Axis:   -56  Text Interpretation: Sinus rhythm Ventricular premature complex Left anterior fascicular block Artifact Confirmed by Cathren Laine ([id number]) on 02/19/2023 2:54:36 PM  Radiology DG Chest Port 1 View  Result Date: 02/19/2023 CLINICAL DATA:  Sepsis EXAM: PORTABLE CHEST 1 VIEW COMPARISON:  11/06/2022 FINDINGS: Underinflation. Normal cardiopericardial silhouette. Overlapping cardiac leads. Degenerative changes of the spine. Subtle hazy opacity in the left lung base. Mild infiltrates possible. Recommend follow-up. No pneumothorax, edema or effusion. IMPRESSION: Underinflation with questionable mild opacity left lung base. Recommend follow-up Electronically Signed   By: Karen Kays M.D.   On: 02/19/2023 15:18    Procedures Procedures    Medications  Ordered in ED Medications  vancomycin (VANCOREADY) IVPB 1500 mg/300 mL (1,500 mg Intravenous New Bag/Given 02/19/23 1345)  lactated ringers bolus 1,000 mL (0 mLs Intravenous Stopped 02/19/23 1337)    And  lactated ringers bolus 1,000 mL (0 mLs Intravenous Stopped 02/19/23 1422)    And  lactated ringers bolus 250 mL (0 mLs Intravenous Stopped 02/19/23 1338)  ceFEPIme (MAXIPIME) 2 g in sodium chloride 0.9 % 100 mL IVPB (0 g Intravenous Stopped 02/19/23 1338)  metroNIDAZOLE (FLAGYL) IVPB 500 mg (0 mg Intravenous Stopped 02/19/23 1422)  potassium chloride (KLOR-CON) packet 40 mEq (40 mEq Oral Given 02/19/23 1420)    ED Course/ Medical Decision Making/ A&P                                 Medical Decision Making Problems Addressed: Acute alteration in mental status: acute illness or injury with systemic symptoms that poses a threat to life or bodily functions Generalized weakness: acute illness or injury with systemic symptoms that poses a threat to life or bodily functions Hypokalemia: acute illness or injury Other specified hypotension: acute illness or injury with systemic symptoms Sepsis due to urinary tract infection (HCC): acute illness or injury with systemic symptoms that poses a threat to life or bodily functions  Amount and/or Complexity of Data Reviewed Independent Historian: EMS    Details: hx External Data Reviewed: notes. Labs: ordered. Decision-making details documented in ED Course. Radiology: ordered and independent  interpretation performed. Decision-making details documented in ED Course. ECG/medicine tests: ordered and independent interpretation performed. Decision-making details documented in ED Course. Discussion of management or test interpretation with external provider(s): medicine  Risk Prescription drug management. Decision regarding hospitalization.   Iv ns. Continuous pulse ox and cardiac monitoring. Labs ordered/sent. Imaging ordered.   Differential  diagnosis includes sepsis, uti, pna, etc. Dispo decision including potential need for admission considered - will get labs and imaging and reassess.   Reviewed nursing notes and prior charts for additional history. External reports reviewed. Additional history from: EMS.   Cardiac monitor: sinus rhythm, rate 88.   Cultures sent. Iv abx. LR 30 cc/kg bolus.   Labs reviewed/interpreted by me - wbc 10. Hct 38. K sl low.   Bp improved from prior.   Xrays reviewed/interpreted by me - no def pna.   Additional labs reviewed/interpreted by me - ua c/w uti.   CT reviewed/interpreted by me - no hem.   Hospitalists consulted for admission.  CRITICAL CARE RE:  hypotension/sepsis, acute uti, acute alteration mental status.  Performed by: Suzi Roots Total critical care time: 45 minutes Critical care time was exclusive of separately billable procedures and treating other patients. Critical care was necessary to treat or prevent imminent or life-threatening deterioration. Critical care was time spent personally by me on the following activities: development of treatment plan with patient and/or surrogate as well as nursing, discussions with consultants, evaluation of patient's response to treatment, examination of patient, obtaining history from patient or surrogate, ordering and performing treatments and interventions, ordering and review of laboratory studies, ordering and review of radiographic studies, pulse oximetry and re-evaluation of patient's condition.          Final Clinical Impression(s) / ED Diagnoses Final diagnoses:  None    Rx / DC Orders ED Discharge Orders     None         Cathren Laine, MD 02/19/23 1559

## 2023-02-19 NOTE — Progress Notes (Signed)
[facility] is following sepsis bundle. 

## 2023-02-19 NOTE — Progress Notes (Signed)
Pt came in today as a new pt. Upon checking in [place] our RMA brought the patient to room 7, and he had a CNA, Catrice with him from the facility  who had stated that he wasn't acted like himself. She states that normally he can speak, carry a conversation and follow commands. She states that he wheels himself around at facility.  The RMA, Malva Cogan indicated that when she took his blood pressure on the right arm it was 73/47 and as soon as they took off BP cuff he states he was hot and hunched over. The CNA with him felt like his eyes when back. Shena leaned down and called his name and his eyes opened and that is when she called me into the room. I came in the room and his eyes were open but he didn't appear to track me or follow commands. I took a manual bp in the left arm and got 78/54. I asked him to squeeze my hand and there was some slight movement in the left hand more so than the right hand but no major grip in either. Dr. Marjory Lies came into the room. He complete and exam and agreed that 911 be called. Pt was sweaty and we did remove his jacket. We do not have a blood glucose monitor but patient doesn't appear to be diabetic. Confirmed with facility that he is a full code. Rechecked BP once more and it was 78/52 left arm. EMS arrived and took over assessment and assisted the pt to their stretcher and he will be taken to [facility]. The CNA took the patient wheelchair and patient's hoodie and will take back to the facility.

## 2023-02-19 NOTE — ED Triage Notes (Signed)
Pt BIB [facility] from [facility] due to altered mental status.  Pt currently SNF is [facility].  Hypotension  at [facility] of 78/52.  EMS BP 100/60.  Pt clutching at his chest en route.  12 lead unremarkable.  HR 56-80.  Hx dementia & stroke.  CBG 150.

## 2023-02-19 NOTE — H&P (Signed)
History and Physical    PatientHarm Teo Wilson:[id number] DOB: March 08, 1941 DOA: 02/19/2023 DOS: the patient was seen and examined on 02/19/2023 PCP: [name], MD  Patient coming from: ALF/ILF  Chief Complaint:  Chief Complaint  Patient presents with   Altered Mental Status   HPI: Jeff Wilson is a 82 y.o. male with medical history significant of HTN, HLD, TIA, dementia-who was sent from neurology's office for evaluation of lethargy/confusion.  Please note-most of this history is obtained after speaking with ED staff, reviewing ED note-patient is confused and not able to participate in any history taking process.  Per history obtained-patient apparently had a routine visit at his neurologist office where he was noted to be weak/lethargic and confused.  He was found to be hypotensive-initial blood pressure was in the 70 systolic range-CBGs were stable-he was then sent to the ED for further evaluation.  While in the ED-he was noted to have a mildly elevated lactic acid, blood pressure was soft but stable-further workup revealed possible UTI.  Patient was given IV fluids-on broad-spectrum antibiotics with further improvement in his mentation.  [facility] hospitalist was then asked to admit this patient for further evaluation and treatment  Per my conversation with patient's sister-and from review of RN note from [facility]-apparently at the facility-he can carry a conversation and follow commands, he is nonambulatory-and is mostly mobile via a wheelchair.  No reported history of fever No reported history of nausea/vomiting/diarrhea No history of abdominal pain  Review of Systems: As mentioned in the history of present illness. All other systems reviewed and are negative. Past Medical History:  Diagnosis Date   Dementia (HCC)    Hyperlipemia    TIA (transient ischemic attack)    No past surgical history on file. Social History:  reports that he has quit smoking. He has  never used smokeless tobacco. No history on file for alcohol use and drug use.  No Known Allergies  Family History  Problem Relation Age of Onset   Heart disease Neg Hx     Prior to Admission medications   Medication Sig Start Date End Date Taking? Authorizing Provider  amLODipine (NORVASC) 10 MG tablet Take 10 mg by mouth daily.   Yes [provider]  atorvastatin (LIPITOR) 40 MG tablet Take 40 mg by mouth daily. 02/18/23  Yes [provider]  calcium carbonate (TUMS - DOSED IN MG ELEMENTAL CALCIUM) 500 MG chewable tablet Chew 1 tablet by mouth daily.   Yes [provider]  clopidogrel (PLAVIX) 75 MG tablet Take 75 mg by mouth daily.   Yes [provider]  ketoconazole (NIZORAL) 2 % shampoo Apply 1 Application topically. On Mondays every 2 weeks   Yes [provider]  Multiple Vitamin (MULTIVITAMIN WITH MINERALS) TABS tablet Take 1 tablet by mouth daily. 12/18/21  Yes Standley Brooking, MD  polyethylene glycol (MIRALAX / GLYCOLAX) 17 g packet Take 17 g by mouth daily.   Yes [provider]  sennosides-docusate sodium (SENOKOT-S) 8.6-50 MG tablet Take 2 tablets by mouth daily.   Yes [provider]  Vitamin D, Ergocalciferol, (DRISDOL) 1.25 MG (50000 UNIT) CAPS capsule Take 50,000 Units by mouth every Monday.   Yes [provider]    Physical Exam: Vitals:   02/19/23 1445 02/19/23 1500 02/19/23 1610 02/19/23 1618  BP: 127/73 131/74 123/71   Pulse: 78 83 82   Resp: 18 15 16    Temp:    98.4 F (36.9 C)  TempSrc:  SpO2: 100% 100% 100%   Weight:      Height:       Gen Exam:Alert awake-not in any distress HEENT:atraumatic, normocephalic Chest: B/L clear to auscultation anteriorly CVS:S1S2 regular Abdomen: Limited exam-pushes me away-abdomen appears to be somewhat tender in the mid abdominal area without any peritoneal signs.  Unclear if he has a distended bladder. Extremities:no edema Neurology: Non  focal Skin: no rash   Data Reviewed:    Latest Ref Rng & Units 02/19/2023   12:52 PM 11/06/2022    3:06 PM 11/06/2022    2:45 PM  CBC  WBC 4.0 - 10.5 K/uL 10.4   5.7   Hemoglobin 13.0 - 17.0 g/dL 59.5  63.8  75.6   Hematocrit 39.0 - 52.0 % 37.8  41.0  38.9   Platelets 150 - 400 K/uL 185   181         Latest Ref Rng & Units 02/19/2023   12:52 PM 11/06/2022    3:06 PM 11/06/2022    2:45 PM  BMP  Glucose 70 - 99 mg/dL 433  86  90   BUN 8 - 23 mg/dL 12  16  15    Creatinine 0.61 - 1.24 mg/dL 2.95  1.88  4.16   Sodium 135 - 145 mmol/L 143  142  142   Potassium 3.5 - 5.1 mmol/L 3.2  3.1  3.1   Chloride 98 - 111 mmol/L 105  103  104   CO2 22 - 32 mmol/L 25   29   Calcium 8.9 - 10.3 mg/dL 9.7   9.3      Chest x-ray: No obvious pneumonia (personally reviewed)  Twelve-lead EKG: Sinus rhythm  Assessment and Plan: Sepsis likely due to complicated UTI (presented with encephalopathy/hypotension) Both blood pressure and mentation have reportedly improved after patient received IV fluids/IV antibiotics. Will narrow antibiotics to Rocephin Follow blood/urine cultures Not clear whether he has a distended bladder on exam-as he pushes me away-will asked nursing staff to do a bladder scan/in/out catheterization-if that shows significant amount of urine-will place a Foley catheter.    Acute metabolic encephalopathy Likely due to sepsis/hypotension CT head imaging pending Per my conversation with the EDP-his mentation has already improved compared to how he first presented to the ED.  He is still confused during my exam-per sister-he is normally conversant-and she is not aware of a formal diagnosis of dementia.   Plan is to treat underlying possible UTI-and see how he does-if no improvement-then we can consider alternative diagnoses/further workup.  HTN Hold all antihypertensives until blood pressure stabilizes further  HLD Resume statin  History of  TIA Statin/Plavix  Dementia Delirium precautions See above regarding family unaware of a formal diagnosis of dementia-although dementia has been documented in his prior hospitalization in 2023.  Chronic debility/deconditioning Mostly bed to wheelchair bound-however able to transfer PT/OT/SLP eval   Advance Care Planning:   Code Status: Full Code (per sister-she is not aware of prior DNR paperwork-patient is a full code-however she would not want him alive-if his prognosis is dire/vegetative state)  Consults: None  Family Communication: Sister-[name] Watkins-[phone] over the phone on 10/23  Severity of Illness: The appropriate patient status for this patient is INPATIENT. Inpatient status is judged to be reasonable and necessary in order to provide the required intensity of service to ensure the patient's safety. The patient's presenting symptoms, physical exam findings, and initial radiographic and laboratory data in the context of their chronic comorbidities is felt to place them  at high risk for further clinical deterioration. Furthermore, it is not anticipated that the patient will be medically stable for discharge from the hospital within 2 midnights of admission.   * I certify that at the point of admission it is my clinical judgment that the patient will require inpatient hospital care spanning beyond 2 midnights from the point of admission due to high intensity of service, high risk for further deterioration and high frequency of surveillance required.*  Author: Jeoffrey Massed, MD 02/19/2023 4:41 PM  For on call review [email or web address].

## 2023-02-19 NOTE — Progress Notes (Signed)
Patient presented for office visit.  I was called by staff to evaluate patient due to new onset of unresponsiveness, and low blood pressure.  BP noted to be 73/47.  Patient was sitting in the wheelchair, slightly leaned forward.  Patient was slightly moaning, and using his right hand to hold his abdomen and sternal region.  No verbal response to command or stimulation.  Eyes open, looking down.  Diffuse weakness without asymmetry in the upper extremities.  911 Was called to evaluate and transport to ER for evaluation.  Suanne Marker, MD 02/19/2023, 4:01 PM Certified in Neurology, Neurophysiology and Neuroimaging  [facility] [address] [place], [place] [address] [phone]

## 2023-02-20 DIAGNOSIS — E861 Hypovolemia: Secondary | ICD-10-CM | POA: Diagnosis not present

## 2023-02-20 LAB — URINE CULTURE: Culture: NO GROWTH

## 2023-02-20 LAB — COMPREHENSIVE METABOLIC PANEL
ALT: 15 U/L (ref 0–44)
AST: 24 U/L (ref 15–41)
Albumin: 2.6 g/dL — ABNORMAL LOW (ref 3.5–5.0)
Alkaline Phosphatase: 104 U/L (ref 38–126)
Anion gap: 7 (ref 5–15)
BUN: 11 mg/dL (ref 8–23)
CO2: 23 mmol/L (ref 22–32)
Calcium: 8.5 mg/dL — ABNORMAL LOW (ref 8.9–10.3)
Chloride: 109 mmol/L (ref 98–111)
Creatinine, Ser: 0.81 mg/dL (ref 0.61–1.24)
GFR, Estimated: >60 mL/min (ref >60)
Glucose, Bld: 138 mg/dL — ABNORMAL HIGH (ref 70–99)
Potassium: 3 mmol/L — ABNORMAL LOW (ref 3.5–5.1)
Sodium: 139 mmol/L (ref 135–145)
Total Bilirubin: 0.6 mg/dL (ref 0.3–1.2)
Total Protein: 5.8 g/dL — ABNORMAL LOW (ref 6.5–8.1)

## 2023-02-20 LAB — CBC
HCT: 36.6 % — ABNORMAL LOW (ref 39.0–52.0)
Hemoglobin: 12.1 g/dL — ABNORMAL LOW (ref 13.0–17.0)
MCH: 27.9 pg (ref 26.0–34.0)
MCHC: 33.1 g/dL (ref 30.0–36.0)
MCV: 84.3 fL (ref 80.0–100.0)
Platelets: 172 10*3/uL (ref 150–400)
RBC: 4.34 MIL/uL (ref 4.22–5.81)
RDW: 15 % (ref 11.5–15.5)
WBC: 8.2 10*3/uL (ref 4.0–10.5)
nRBC: 0 % (ref 0.0–0.2)

## 2023-02-20 MED ORDER — POTASSIUM CHLORIDE 20 MEQ PO PACK
40.0000 meq | PACK | Freq: Once | ORAL | Status: AC
  Administered 2023-02-20: 40 meq via ORAL
  Filled 2023-02-20: qty 2

## 2023-02-20 NOTE — Evaluation (Signed)
Physical Therapy Evaluation/ Discharge Patient Details Name: Jeff Wilson MRN: [id number] DOB: [birth year] Today's Date: 02/20/2023  History of Present Illness  Pt is 82 y.o. male admitted 02/19/23 for hypotension, lethargy and AMS from [facility] neurology, PMH of HTN, HLD, TIA, dementia. Pt baseline in SNF at [facility].  Clinical Impression  Pt pleasant, stating he gets up to [facility] on his own and moves around. 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 Pt with impaired cognition, incontinent of B/B. Pt currently requires +2 assist for all mobility and anticipate close to baseline although no family present to confirm. Will defer further therapy to SNF but anticipate custodial care. No further acute therapy needs and recommend positioning by nursing and OOB via lift. Will sign off.       If plan is discharge home, recommend the following: Two people to help with walking and/or transfers;Two people to help with bathing/dressing/bathroom;Direct supervision/assist for medications management;Supervision due to cognitive status;Assistance with cooking/housework;Assist for transportation;Assistance with feeding   Can travel by private vehicle   No    Equipment Recommendations Hospital bed;Hoyer lift;Wheelchair (measurements PT)  Recommendations for Other Services       Functional Status Assessment Patient has not had a recent decline in their functional status     Precautions / Restrictions Precautions Precautions: Fall;Other (comment) Precaution Comments: incontinent B/B      Mobility  Bed Mobility Overal bed mobility: Needs Assistance Bed Mobility: Rolling, Supine to Sit, Sit to Supine Rolling: Max assist   Supine to sit: Max assist, +2 for physical assistance Sit to supine: +2 for physical assistance, Total assist   General bed mobility comments: max assist with pad to roll  bil for pericare and linen change. Max +2 with helicopter pivot to EOB to rise to sitting with max assist for trunk control due to posterior bias with progression to min assist. Total +2 to return to supine and slide toward [facility]    Transfers                   General transfer comment: unable, will require lift    Ambulation/Gait                  Stairs            Wheelchair Mobility     Tilt Bed    Modified Rankin (Stroke Patients Only)       Balance Overall balance assessment: Needs assistance   Sitting balance-Leahy Scale: Poor Sitting balance - Comments: max progression to min assist due to posterior lean for sitting balance                                     Pertinent Vitals/Pain Pain Assessment Pain Assessment: PAINAD Breathing: normal Negative Vocalization: none Facial Expression: smiling or inexpressive Body Language: relaxed Consolability: no need to console PAINAD Score: 0 Pain Intervention(s): Repositioned    Home Living Family/patient expects to be discharged to:: Skilled nursing facility                   Additional Comments: pt states he gets to a [facility] and moves it on his own, however contradicted by pt LLE contracture that will not allow >15degree knee flexion    Prior Function  Mobility Comments: pt unable to provide, incontinent of B/B on arrival and unable to process or assist with gown change       Extremity/Trunk Assessment   Upper Extremity Assessment Upper Extremity Assessment: Generalized weakness    Lower Extremity Assessment Lower Extremity Assessment: RLE deficits/detail;LLE deficits/detail RLE Deficits / Details: grossly 2/5 LLE Deficits / Details: knee contracture maintaining extension with <15 degrees flexion, hips able to achive 90 degree flexion    Cervical / Trunk Assessment Cervical / Trunk Assessment: Normal (posterior bias in sitting)  Communication    Communication Communication: Difficulty following commands/understanding Following commands: Follows one step commands inconsistently;Follows one step commands with increased time Cueing Techniques: Verbal cues;Tactile cues  Cognition Arousal: Alert Behavior During Therapy: WFL for tasks assessed/performed Overall Cognitive Status: No family/caregiver present to determine baseline cognitive functioning                                 General Comments: no family present, pt with hx of cognitive deficits. Pt oriented to self, not aware of hospital stating date as July of 2024 and repeatedly stating he needs to go to the MD. Jeff Wilson of B/B incontinence        General Comments      Exercises     Assessment/Plan    PT Assessment Patient does not need any further PT services  PT Problem List         PT Treatment Interventions      PT Goals (Current goals can be found in the Care Plan section)  Acute Rehab PT Goals PT Goal Formulation: All assessment and education complete, DC therapy    Frequency       Co-evaluation               AM-PAC PT "6 Clicks" Mobility  Outcome Measure Help needed turning from your back to your side while in a flat bed without using bedrails?: Total Help needed moving from lying on your back to sitting on the side of a flat bed without using bedrails?: Total Help needed moving to and from a bed to a chair (including a wheelchair)?: Total Help needed standing up from a chair using your arms (e.g., wheelchair or bedside chair)?: Total Help needed to walk in hospital room?: Total Help needed climbing 3-5 steps with a railing? : Total 6 Click Score: 6    End of Session   Activity Tolerance: Patient tolerated treatment well Patient left: in bed;with bed alarm set;with call bell/phone within reach Nurse Communication: Mobility status;Need for lift equipment PT Visit Diagnosis: Other abnormalities of gait and mobility (R26.89);Muscle  weakness (generalized) (M62.81)    Time: 1610-9604 PT Time Calculation (min) (ACUTE ONLY): 25 min   Charges:   PT Evaluation $PT Eval Moderate Complexity: 1 Mod PT Treatments $Therapeutic Activity: 8-22 mins PT General Charges $$ ACUTE PT VISIT: 1 Visit         Jeff Wilson, PT Acute Rehabilitation Services Office: [phone]   Jeff Wilson Jeff Wilson 02/20/2023, 10:31 AM

## 2023-02-20 NOTE — Evaluation (Signed)
Occupational Therapy Evaluation Patient Details Name: Jeff Wilson MRN: [id number] DOB: Dec 31, 1940 Today's Date: 02/20/2023   History of Present Illness Pt is 82 y.o. male admitted 02/19/23 for hypotension, lethargy and AMS from [facility] neurology, PMH of HTN, HLD, TIA, dementia. Pt baseline in SNF at [facility].   Clinical Impression   Pt is dependent in mobility and ADLs, presents similar to that of admission in August of last year when seen by PT. Pt requires total assist for rolling/repositioning in bed. He is able to communicate needs of being cold and declining food and drink. No acute OT needs.       If plan is discharge home, recommend the following: Two people to help with walking and/or transfers;Two people to help with bathing/dressing/bathroom;Assistance with cooking/housework;Assistance with feeding;Direct supervision/assist for medications management;Direct supervision/assist for financial management;Assist for transportation;Help with stairs or ramp for entrance    Functional Status Assessment  Patient has not had a recent decline in their functional status  Equipment Recommendations  Other (comment) (defer to SNF)    Recommendations for Other Services       Precautions / Restrictions Precautions Precautions: Fall;Other (comment) Precaution Comments: incontinent B/B Restrictions Weight Bearing Restrictions: No      Mobility Bed Mobility Overal bed mobility: Needs Assistance Bed Mobility: Rolling Rolling: Total assist         General bed mobility comments: assist for all aspects using bed pad    Transfers                   General transfer comment: unable, will require lift      Balance                                           ADL either performed or assessed with clinical judgement   ADL                                         General ADL Comments: pt is dependent, demonstrated ability to bring  washcloth to face with R hand and drink from cup placed in his hand     Vision Ability to See in Adequate Light: 0 Adequate       Perception         Praxis         Pertinent Vitals/Pain Pain Assessment Pain Assessment: Faces Faces Pain Scale: No hurt     Extremity/Trunk Assessment Upper Extremity Assessment Upper Extremity Assessment: Generalized weakness   Lower Extremity Assessment Lower Extremity Assessment: Defer to PT evaluation RLE Deficits / Details: grossly 2/5 LLE Deficits / Details: knee contracture maintaining extension with <15 degrees flexion, hips able to achive 90 degree flexion   Cervical / Trunk Assessment Cervical / Trunk Assessment: Other exceptions (weakness)   Communication Communication Communication: Difficulty following commands/understanding Following commands: Follows one step commands inconsistently;Follows one step commands with increased time Cueing Techniques: Verbal cues;Tactile cues   Cognition Arousal: Alert Behavior During Therapy: Flat affect Overall Cognitive Status: No family/caregiver present to determine baseline cognitive functioning                                 General Comments: pt reports he is cold and after providing blankets  and repositioning, declared he felt better     General Comments       Exercises     Shoulder Instructions      Home Living Family/patient expects to be discharged to:: Skilled nursing facility                                 Additional Comments: pt states he gets to a WC and moves it on his own, however contradicted by pt LLE contracture that will not allow >15degree knee flexion      Prior Functioning/Environment Prior Level of Function : Needs assist             Mobility Comments: pt unable to provide, needing extensive assistance for bed level mobility ADLs Comments: brought washcloth to face, but no true wiping, sipped drink with cup with straw         OT Problem List: Decreased strength      OT Treatment/Interventions:      OT Goals(Current goals can be found in the care plan section)    OT Frequency:      Co-evaluation              AM-PAC OT "6 Clicks" Daily Activity     Outcome Measure Help from another person eating meals?: A Lot Help from another person taking care of personal grooming?: Total Help from another person toileting, which includes using toliet, bedpan, or urinal?: Total Help from another person bathing (including washing, rinsing, drying)?: Total Help from another person to put on and taking off regular upper body clothing?: Total Help from another person to put on and taking off regular lower body clothing?: Total 6 Click Score: 7   End of Session    Activity Tolerance: Patient tolerated treatment well Patient left: in bed;with call bell/phone within reach;with bed alarm set  OT Visit Diagnosis: Muscle weakness (generalized) (M62.81);Other symptoms and signs involving cognitive function                Time: 9629-5284 OT Time Calculation (min): 22 min Charges:  OT General Charges $OT Visit: 1 Visit OT Evaluation $OT Eval Moderate Complexity: 1 Mod  Berna Spare, OTR/L Acute Rehabilitation Services Office: [phone]   Evern Bio 02/20/2023, 11:06 AM

## 2023-02-20 NOTE — Plan of Care (Signed)

## 2023-02-20 NOTE — Evaluation (Signed)
Clinical/Bedside Swallow Evaluation Patient Details  Name: Jeff Wilson MRN: [id number] Date of Birth: 1940-12-08  Today's Date: 02/20/2023 Time:        Past Medical History:  Past Medical History:  Diagnosis Date   Dementia (HCC)    Hyperlipemia    TIA (transient ischemic attack)    Past Surgical History: No past surgical history on file. HPI:  Patient is an 82 y.o. male who presented on 02/19/23 from his neurology office per lethargy/confusion. Further workup revealed possible UTI. PMH is significant for HTN, HLD, TIA, and dementia. Speech swallow eval ordered on 10/24.    Assessment / Plan / Recommendation  Clinical Impression  Patient seen by SLP for bedside swallow evaluation. Patient was asleep when SLP entered the room but awoke to sound of voice. SLP directly observed patient with thin liquid via straw and solid food. No overt s/sx of dysphagia noted. Patient required set up assist for both the drink and food. He would likely require total assist with utensils. SLP recommending continue regular diet with thin liquids at this time. Full supervision during meals. No further ST indicated. SLP Visit Diagnosis: Dysphagia, unspecified (R13.10)    Aspiration Risk  No limitations    Diet Recommendation Regular;Thin liquid    Liquid Administration via: Cup;Straw;Spoon Medication Administration: Whole meds with liquid (As tolerated) Supervision: Full supervision/cueing for compensatory strategies Compensations: Minimize environmental distractions;Small sips/bites;Slow rate Postural Changes: Seated upright at 90 degrees    Other  Recommendations Oral Care Recommendations: Oral care BID    Recommendations for follow up therapy are one component of a multi-disciplinary discharge planning process, led by the attending physician.  Recommendations may be updated based on patient status, additional functional criteria and insurance authorization.  Follow up Recommendations No SLP follow  up      Assistance Recommended at Discharge    Functional Status Assessment Patient has not had a recent decline in their functional status  Frequency and Duration            Prognosis        Swallow Study   General Date of Onset: 02/19/23 HPI: Patient is an 82 y.o. male who presented on 02/19/23 from his neurology office per lethargy/confusion. Further workup revealed possible UTI. PMH is significant for HTN, HLD, TIA, and dementia. Speech swallow eval ordered on 10/24. Type of Study: Bedside Swallow Evaluation Previous Swallow Assessment: N/a Diet Prior to this Study: Regular;Thin liquids (Level 0) Temperature Spikes Noted: No Respiratory Status: Room air History of Recent Intubation: No Behavior/Cognition: Alert;Cooperative Oral Cavity Assessment: Within Functional Limits Oral Care Completed by SLP: No Oral Cavity - Dentition: Missing dentition Vision: Functional for self-feeding Self-Feeding Abilities: Total assist Patient Positioning: Upright in bed Baseline Vocal Quality: Normal Volitional Swallow: Able to elicit    Oral/Motor/Sensory Function Overall Oral Motor/Sensory Function: Within functional limits   Ice Chips Ice chips: Not tested   Thin Liquid Thin Liquid: Within functional limits Presentation: Straw    Nectar Thick Nectar Thick Liquid: Not tested   Honey Thick Honey Thick Liquid: Not tested   Puree Puree: Not tested   Solid     Solid: Within functional limits     Marline Backbone, B.S., Speech Therapy Student   02/20/2023,11:31 AM

## 2023-02-20 NOTE — Progress Notes (Addendum)
PROGRESS NOTE    Jeff Wilson  YQM:[id number] DOB: July 29, 1940 DOA: 02/19/2023 PCP: [name], MD   Brief Narrative:  This 82 years old male with PMH significant for hypertension, hyperlipidemia, TIA, dementia who was sent from neurology office for further evaluation of lethargy/confusion.  Patient was found to be weak /lethargic and hypotensive in neurologist office.  SBP noted was 70, then he was sent to the ED for further evaluation.  Workup in the ED reveals mildly elevated lactic acid and UA positive for UTI.  Patient is given IV fluids and started on broad-spectrum antibiotics with further improvement in his mentation.  Patient is admitted for further evaluation.   Assessment & Plan:   Principal Problem:   Hypotension Active Problems:   Essential hypertension   Dementia without behavioral disturbance (HCC)   TIA (transient ischemic attack)  Sepsis secondary to complicated UTI: Patient presented with hypotension, confusion consistent with encephalopathy. UA consistent with UTI.  Initiated on Vanco Flagyl and cefepime.  Antibiotics de-escalated to Rocephin. Blood pressure and mentation has significantly improved after patient received IV fluids and antibiotics. Continue IV Rocephin for UTI.  Follow-up blood and urine cultures. Continue IV gentle fluid hydration.  Sepsis physiology improving    Acute metabolic encephalopathy: Likely secondary to sepsis/hypotension. CT head no acute intracranial abnormality found. Mental status has already improved with antibiotics.   HTN: Hold all antihypertensives until blood pressure stabilizes.   HLD Continue statin.   History of TIA Continue Statin/Plavix.   Dementia Delirium precautions: Continue supportive care.  No behavioral problems.   Chronic debility/deconditioning: Mostly bed to wheelchair bound-however able to transfer PT/OT/SLP eval   DVT prophylaxis: Lovenox Code Status: Full code Family Communication: No  family at bedside Disposition Plan:    Status is: Inpatient Remains inpatient appropriate because: Admitted for confusion, hypotension,  found to have sepsis secondary to UTI, He is started on antibiotics with improving mental status.   Consultants:  None  Procedures: CT head.  Antimicrobials: Anti-infectives (From admission, onward)    Start     Dose/Rate Route Frequency Ordered Stop   02/19/23 2200  cefTRIAXone (ROCEPHIN) 1 g in sodium chloride 0.9 % 100 mL IVPB        1 g 200 mL/hr over 30 Minutes Intravenous Every 24 hours 02/19/23 2119     02/19/23 1215  ceFEPIme (MAXIPIME) 2 g in sodium chloride 0.9 % 100 mL IVPB        2 g 200 mL/hr over 30 Minutes Intravenous  Once 02/19/23 1207 02/19/23 1338   02/19/23 1215  metroNIDAZOLE (FLAGYL) IVPB 500 mg        500 mg 100 mL/hr over 60 Minutes Intravenous  Once 02/19/23 1207 02/19/23 1422   02/19/23 1215  vancomycin (VANCOCIN) IVPB 1000 mg/200 mL premix  Status:  Discontinued        1,000 mg 200 mL/hr over 60 Minutes Intravenous  Once 02/19/23 1207 02/19/23 1208   02/19/23 1215  vancomycin (VANCOREADY) IVPB 1500 mg/300 mL        1,500 mg 150 mL/hr over 120 Minutes Intravenous  Once 02/19/23 1208 02/19/23 1628      Subjective: Patient was seen and examined at bedside.  Overnight events noted.   He is already improving.  Following commands.  He has weakness in both lower extremities.  Objective: Vitals:   02/19/23 1656 02/20/23 0035 02/20/23 0459 02/20/23 0719  BP: 129/76 128/71 124/77 124/68  Pulse: 83 69 74 75  Resp: 18 17 18  Temp: 98 F (36.7 C) 98.3 F (36.8 C) 98.2 F (36.8 C) 97.8 F (36.6 C)  TempSrc: Oral Oral Oral   SpO2: 100% 100% 98% 99%  Weight:      Height:        Intake/Output Summary (Last 24 hours) at 02/20/2023 1125 Last data filed at 02/20/2023 0332 Gross per 24 hour  Intake 3555.3 ml  Output --  Net 3555.3 ml   Filed Weights   02/19/23 1305 02/19/23 1618  Weight: 71.7 kg 70.6 kg     Examination:  General exam: Appears calm and comfortable, deconditioned, not in any acute distress. Respiratory system: CTA bilaterally. Respiratory effort normal.  RR 16 Cardiovascular system: S1 & S2 heard, RRR. No JVD, murmurs, rubs, gallops or clicks. No pedal edema. Gastrointestinal system: Abdomen is non distended, soft and non tender. Normal bowel sounds heard. Central nervous system: Alert and oriented x 3.  Bilateral lower extremity weakness Extremities: No edema, no cyanosis, no clubbing Skin: No rashes, lesions or ulcers Psychiatry: Judgement and insight appear normal. Mood & affect appropriate.     Data Reviewed: I have personally reviewed following labs and imaging studies  CBC: Recent Labs  Lab 02/19/23 1252 02/20/23 0936  WBC 10.4 8.2  NEUTROABS 9.2*  --   HGB 12.2* 12.1*  HCT 37.8* 36.6*  MCV 85.5 84.3  PLT 185 172   Basic Metabolic Panel: Recent Labs  Lab 02/19/23 1252 02/20/23 0652  NA 143 139  K 3.2* 3.0*  CL 105 109  CO2 25 23  GLUCOSE 107* 138*  BUN 12 11  CREATININE 0.96 0.81  CALCIUM 9.7 8.5*   GFR: Estimated Creatinine Clearance: 72.6 mL/min (by C-G formula based on SCr of 0.81 mg/dL). Liver Function Tests: Recent Labs  Lab 02/19/23 1252 02/20/23 0652  AST 24 24  ALT 16 15  ALKPHOS 136* 104  BILITOT 1.1 0.6  PROT 7.3 5.8*  ALBUMIN 3.4* 2.6*   No results for input(s): "LIPASE", "AMYLASE" in the last 168 hours. No results for input(s): "AMMONIA" in the last 168 hours. Coagulation Profile: No results for input(s): "INR", "PROTIME" in the last 168 hours. Cardiac Enzymes: No results for input(s): "CKTOTAL", "CKMB", "CKMBINDEX", "TROPONINI" in the last 168 hours. BNP (last 3 results) No results for input(s): "PROBNP" in the last 8760 hours. HbA1C: No results for input(s): "HGBA1C" in the last 72 hours. CBG: No results for input(s): "GLUCAP" in the last 168 hours. Lipid Profile: No results for input(s): "CHOL", "HDL",  "LDLCALC", "TRIG", "CHOLHDL", "LDLDIRECT" in the last 72 hours. Thyroid Function Tests: No results for input(s): "TSH", "T4TOTAL", "FREET4", "T3FREE", "THYROIDAB" in the last 72 hours. Anemia Panel: No results for input(s): "VITAMINB12", "FOLATE", "FERRITIN", "TIBC", "IRON", "RETICCTPCT" in the last 72 hours. Sepsis Labs: Recent Labs  Lab 02/19/23 1303 02/19/23 1448  LATICACIDVEN 2.4* 2.3*    Recent Results (from the past 240 hour(s))  Blood Culture (routine x 2)     Status: None (Preliminary result)   Collection Time: 02/19/23 12:48 PM   Specimen: BLOOD  Result Value Ref Range Status   Specimen Description BLOOD SITE NOT SPECIFIED  Final   Special Requests   Final    BOTTLES DRAWN AEROBIC AND ANAEROBIC Blood Culture results may not be optimal due to an inadequate volume of blood received in culture bottles   Culture   Final    NO GROWTH < 24 HOURS Performed at [facility], [address], [place], [place] [address]    Report  Status PENDING  Incomplete  Blood Culture (routine x 2)     Status: None (Preliminary result)   Collection Time: 02/19/23 12:51 PM   Specimen: BLOOD LEFT HAND  Result Value Ref Range Status   Specimen Description BLOOD LEFT HAND  Final   Special Requests   Final    BOTTLES DRAWN AEROBIC ONLY Blood Culture results may not be optimal due to an inadequate volume of blood received in culture bottles   Culture   Final    NO GROWTH < 24 HOURS Performed at [facility], [address], [place], [place] [address]    Report Status PENDING  Incomplete  Resp panel by RT-PCR (RSV, Flu A&B, Covid) Anterior Nasal Swab     Status: None   Collection Time: 02/19/23 12:52 PM   Specimen: Anterior Nasal Swab  Result Value Ref Range Status   SARS Coronavirus 2 by RT PCR NEGATIVE NEGATIVE Final   Influenza A by PCR NEGATIVE NEGATIVE Final   Influenza B by PCR NEGATIVE NEGATIVE Final    Comment: (NOTE) The Xpert Xpress SARS-CoV-2/FLU/RSV plus assay is  intended as an aid in the diagnosis of influenza from Nasopharyngeal swab specimens and should not be used as a sole basis for treatment. Nasal washings and aspirates are unacceptable for Xpert Xpress SARS-CoV-2/FLU/RSV testing.  Fact Sheet for Patients: BloggerCourse.com  Fact Sheet for Healthcare Providers: SeriousBroker.it  This test is not yet approved or cleared by the [place] FDA and has been authorized for detection and/or diagnosis of SARS-CoV-2 by FDA under an Emergency Use Authorization (EUA). This EUA will remain in effect (meaning this test can be used) for the duration of the COVID-19 declaration under Section 564(b)(1) of the Act, 21 U.S.C. section 360bbb-3(b)(1), unless the authorization is terminated or revoked.     Resp Syncytial Virus by PCR NEGATIVE NEGATIVE Final    Comment: (NOTE) Fact Sheet for Patients: BloggerCourse.com  Fact Sheet for Healthcare Providers: SeriousBroker.it  This test is not yet approved or cleared by the [place] FDA and has been authorized for detection and/or diagnosis of SARS-CoV-2 by FDA under an Emergency Use Authorization (EUA). This EUA will remain in effect (meaning this test can be used) for the duration of the COVID-19 declaration under Section 564(b)(1) of the Act, 21 U.S.C. section 360bbb-3(b)(1), unless the authorization is terminated or revoked.  Performed at [facility], [address] [place], [place] [address]     Radiology Studies: CT Head Wo Contrast  Result Date: 02/19/2023 CLINICAL DATA:  Altered mental status EXAM: CT HEAD WITHOUT CONTRAST TECHNIQUE: Contiguous axial images were obtained from the base of the skull through the vertex without intravenous contrast. RADIATION DOSE REDUCTION: This exam was performed according to the departmental dose-optimization program which includes automated  exposure control, adjustment of the mA and/or kV according to patient size and/or use of iterative reconstruction technique. COMPARISON:  11/06/2022 FINDINGS: Brain: No evidence of acute infarction, hemorrhage, hydrocephalus, extra-axial collection or mass lesion/mass effect. Chronic atrophic and ischemic changes are noted. Vascular: No hyperdense vessel or unexpected calcification. Skull: Normal. Negative for fracture or focal lesion. Sinuses/Orbits: No acute finding. Other: None. IMPRESSION: Chronic atrophic and ischemic changes without acute abnormality. Electronically Signed   By: Alcide Clever M.D.   On: 02/19/2023 18:05   DG Chest Port 1 View  Result Date: 02/19/2023 CLINICAL DATA:  Sepsis EXAM: PORTABLE CHEST 1 VIEW COMPARISON:  11/06/2022 FINDINGS: Underinflation. Normal cardiopericardial silhouette. Overlapping cardiac leads. Degenerative changes of  the spine. Subtle hazy opacity in the left lung base. Mild infiltrates possible. Recommend follow-up. No pneumothorax, edema or effusion. IMPRESSION: Underinflation with questionable mild opacity left lung base. Recommend follow-up Electronically Signed   By: Karen Kays M.D.   On: 02/19/2023 15:18    Scheduled Meds:  atorvastatin  40 mg Oral Daily   clopidogrel  75 mg Oral Daily   enoxaparin (LOVENOX) injection  40 mg Subcutaneous Q24H   multivitamin with minerals  1 tablet Oral Daily   polyethylene glycol  17 g Oral Daily   senna-docusate  2 tablet Oral Daily   Continuous Infusions:  sodium chloride 50 mL/hr at 02/20/23 0332   cefTRIAXone (ROCEPHIN)  IV 1 g (02/19/23 2212)     LOS: 1 day    Time spent: 50 mins    Willeen Niece, MD [facility]   If 7PM-7AM, please contact night-coverage

## 2023-02-21 ENCOUNTER — Encounter (HOSPITAL_COMMUNITY): Payer: Self-pay | Admitting: Internal Medicine

## 2023-02-21 DIAGNOSIS — E861 Hypovolemia: Secondary | ICD-10-CM | POA: Diagnosis not present

## 2023-02-21 LAB — CBC
HCT: 36.2 % — ABNORMAL LOW (ref 39.0–52.0)
Hemoglobin: 11.9 g/dL — ABNORMAL LOW (ref 13.0–17.0)
MCH: 27.8 pg (ref 26.0–34.0)
MCHC: 32.9 g/dL (ref 30.0–36.0)
MCV: 84.6 fL (ref 80.0–100.0)
Platelets: 172 10*3/uL (ref 150–400)
RBC: 4.28 MIL/uL (ref 4.22–5.81)
RDW: 15 % (ref 11.5–15.5)
WBC: 5.4 10*3/uL (ref 4.0–10.5)
nRBC: 0 % (ref 0.0–0.2)

## 2023-02-21 LAB — BASIC METABOLIC PANEL
Anion gap: 6 (ref 5–15)
BUN: 5 mg/dL — ABNORMAL LOW (ref 8–23)
CO2: 30 mmol/L (ref 22–32)
Calcium: 9 mg/dL (ref 8.9–10.3)
Chloride: 106 mmol/L (ref 98–111)
Creatinine, Ser: 0.71 mg/dL (ref 0.61–1.24)
GFR, Estimated: >60 mL/min (ref >60)
Glucose, Bld: 87 mg/dL (ref 70–99)
Potassium: 3.4 mmol/L — ABNORMAL LOW (ref 3.5–5.1)
Sodium: 142 mmol/L (ref 135–145)

## 2023-02-21 LAB — PHOSPHORUS: Phosphorus: 2.3 mg/dL — ABNORMAL LOW (ref 2.5–4.6)

## 2023-02-21 LAB — MAGNESIUM: Magnesium: 1.8 mg/dL (ref 1.7–2.4)

## 2023-02-21 LAB — LACTIC ACID, PLASMA: Lactic Acid, Venous: 0.9 mmol/L (ref 0.5–1.9)

## 2023-02-21 MED ORDER — ENSURE ENLIVE PO LIQD
237.0000 mL | Freq: Two times a day (BID) | ORAL | Status: DC
  Administered 2023-02-21 – 2023-02-22 (×2): 237 mL via ORAL

## 2023-02-21 NOTE — Plan of Care (Signed)
A/ox1 to self only overnight but overall cooperative. Refused some turns. Did not eat dinner. Purewick on. Remains on iv antibiotics.    Problem: Education: Goal: Knowledge of General Education information will improve Description: Including pain rating scale, medication(s)/side effects and non-pharmacologic comfort measures 02/21/2023 0612 by Chrisandra Netters, RN Outcome: Progressing 02/21/2023 0611 by Chrisandra Netters, RN Outcome: Progressing   Problem: Health Behavior/Discharge Planning: Goal: Ability to manage health-related needs will improve 02/21/2023 0612 by Chrisandra Netters, RN Outcome: Progressing 02/21/2023 0611 by Chrisandra Netters, RN Outcome: Progressing   Problem: Clinical Measurements: Goal: Ability to maintain clinical measurements within normal limits will improve 02/21/2023 0612 by Chrisandra Netters, RN Outcome: Progressing 02/21/2023 0611 by Chrisandra Netters, RN Outcome: Progressing Goal: Will remain free from infection 02/21/2023 0612 by Chrisandra Netters, RN Outcome: Progressing 02/21/2023 0611 by Chrisandra Netters, RN Outcome: Progressing Goal: Diagnostic test results will improve 02/21/2023 0612 by Chrisandra Netters, RN Outcome: Progressing 02/21/2023 0611 by Chrisandra Netters, RN Outcome: Progressing Goal: Respiratory complications will improve 02/21/2023 0612 by Chrisandra Netters, RN Outcome: Progressing 02/21/2023 0611 by Chrisandra Netters, RN Outcome: Progressing Goal: Cardiovascular complication will be avoided 02/21/2023 0612 by Chrisandra Netters, RN Outcome: Progressing 02/21/2023 0611 by Chrisandra Netters, RN Outcome: Progressing   Problem: Activity: Goal: Risk for activity intolerance will decrease 02/21/2023 0612 by Chrisandra Netters, RN Outcome: Progressing 02/21/2023 0611 by Chrisandra Netters, RN Outcome: Progressing   Problem: Nutrition: Goal: Adequate nutrition will be maintained 02/21/2023  0612 by Chrisandra Netters, RN Outcome: Progressing 02/21/2023 0611 by Chrisandra Netters, RN Outcome: Progressing   Problem: Coping: Goal: Level of anxiety will decrease 02/21/2023 0612 by Chrisandra Netters, RN Outcome: Progressing 02/21/2023 0611 by Chrisandra Netters, RN Outcome: Progressing   Problem: Elimination: Goal: Will not experience complications related to bowel motility 02/21/2023 0612 by Chrisandra Netters, RN Outcome: Progressing 02/21/2023 0611 by Chrisandra Netters, RN Outcome: Progressing Goal: Will not experience complications related to urinary retention 02/21/2023 0612 by Chrisandra Netters, RN Outcome: Progressing 02/21/2023 0611 by Chrisandra Netters, RN Outcome: Progressing   Problem: Pain Management: Goal: General experience of comfort will improve 02/21/2023 0612 by Chrisandra Netters, RN Outcome: Progressing 02/21/2023 0611 by Chrisandra Netters, RN Outcome: Progressing   Problem: Safety: Goal: Ability to remain free from injury will improve 02/21/2023 0612 by Chrisandra Netters, RN Outcome: Progressing 02/21/2023 0611 by Chrisandra Netters, RN Outcome: Progressing   Problem: Skin Integrity: Goal: Risk for impaired skin integrity will decrease 02/21/2023 0612 by Chrisandra Netters, RN Outcome: Progressing 02/21/2023 0611 by Chrisandra Netters, RN Outcome: Progressing

## 2023-02-21 NOTE — TOC Initial Note (Signed)
Transition of Care [facility]) - Initial/Assessment Note    Patient Details  Name: Jeff Wilson MRN: [id number] Date of Birth: [birth year]  Transition [facility] CM/SW Contact:    Rosely Fernandez A [place], Theresia Majors Phone Number: 02/21/2023, 4:32 PM  Clinical Narrative:                  CSW met with pt at bedside. He is from LTC at [facility].  Possible DC tomorrow. CSW reached out to facility, received no answer, to provide update for possible DC. Weekend CSW to assist with discharge over weekend if pt stable.   TOC will continue to follow.  Expected Discharge Plan: Long Term Nursing Home Barriers to Discharge: Continued Medical Work up   Patient Goals and CMS Choice            Expected Discharge Plan and Services       Living arrangements for the past 2 months: Skilled Nursing Facility                                      Prior Living Arrangements/Services Living arrangements for the past 2 months: Skilled Nursing Facility Lives with:: Facility Resident                   Activities of Daily Living   ADL Screening (condition at time of admission) Independently performs ADLs?: No Does the patient have a NEW difficulty with bathing/dressing/toileting/self-feeding that is expected to last >3 days?: No Does the patient have a NEW difficulty with getting in/out of bed, walking, or climbing stairs that is expected to last >3 days?: No Does the patient have a NEW difficulty with communication that is expected to last >3 days?: No Is the patient deaf or have difficulty hearing?: No Does the patient have difficulty seeing, even when wearing glasses/contacts?: No Does the patient have difficulty concentrating, remembering, or making decisions?: Yes  Permission Sought/Granted                  Emotional Assessment Appearance:: Appears stated age Attitude/Demeanor/Rapport: Lethargic Affect (typically observed): Appropriate Orientation: : Oriented to Self, Oriented to  Place Alcohol / Substance Use: Not Applicable Psych Involvement: No (comment)  Admission diagnosis:  Other specified hypotension [I95.89] Hypokalemia [E87.6] Generalized weakness [R53.1] Sepsis due to urinary tract infection (HCC) [A41.9, N39.0] Hypotension [I95.9] Acute alteration in mental status [R41.82] Patient Active Problem List   Diagnosis Date Noted   Hypotension 02/19/2023   Bacteremia 12/13/2021   Ileus (HCC) 12/12/2021   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   Mixed hyperlipidemia 12/11/2021   Dementia without behavioral disturbance (HCC) 12/11/2021   Lactic acidosis 12/11/2021   Cerebrovascular disease 12/11/2021   Hypokalemia 12/11/2021   Impaired mobility and ADLs 06/19/2020   Acute ischemic stroke (HCC) 04/01/2019   Ambulatory dysfunction 04/01/2019   TIA (transient ischemic attack) 03/28/2019   Essential hypertension 01/28/2019   Fall at home, initial encounter 01/28/2019   History of CVA (cerebrovascular accident) 01/28/2019   Non compliance w medication regimen 01/28/2019   Weakness of left lower extremity 01/28/2019   PCP:  [name], MD Pharmacy:   [facility], [place] - [address] [place] [address] Phone: [phone] Fax: [phone]     Social Determinants of Health (SDOH) Social History: SDOH Screenings   Food Insecurity: Patient Unable To Answer (02/21/2023)  Housing: High Risk (  02/21/2023)  Transportation Needs: Patient Unable To Answer (02/21/2023)  Utilities: Patient Unable To Answer (02/21/2023)  Financial Resource Strain: Low Risk  (01/28/2019)   Received from [facility] visits prior to 06/29/2022., [facility] visits prior to 06/29/2022.  Social Connections: Socially Isolated (01/28/2019)   Received from [facility] visits prior to 06/29/2022., [facility] visits prior to 06/29/2022.  Tobacco Use: Medium Risk (02/21/2023)    SDOH Interventions:     Readmission Risk Interventions    12/17/2021   12:10 PM  Readmission Risk Prevention Plan  Transportation Screening Complete  PCP or Specialist Appt within 5-7 Days Complete  Home Care Screening Complete  Medication Review (RN CM) Complete

## 2023-02-21 NOTE — Progress Notes (Signed)
Initial Nutrition Assessment  DOCUMENTATION CODES:   Not applicable  INTERVENTION:  -ensure bid -MVM -Liberalized diet   NUTRITION DIAGNOSIS:   Increased nutrient needs related to chronic illness as evidenced by percent weight loss, estimated needs.    GOAL:   Patient will meet greater than or equal to 90% of their needs    MONITOR:   PO intake, Supplement acceptance, Weight trends, Labs  REASON FOR ASSESSMENT:   Malnutrition Screening Tool    ASSESSMENT:  82 y.o. M, Patient found to be weak/lethargic and hypotensive in neurologist office. Admitted for hypotension, sepsis secondary to UTI, and acute metabolic encephalopathy. PMH HTN, HLD, TIS, Dementia.  Reached out to patient via phone with no answer. All information obtained through team and EMR. RN reports fair to good appetite. History of weight revealed ~19% weight loss in ~ 90 days. There is no benefit to excessive dietary restrictions related to advanced age, weight loss and increased nutrient needs. Patient would benefit better from a liberalized to help meet increased nutrient needs and promote better oral intake.    Admit weight: 70.6 kg Current weight: 70.6 kg Weight history: 02/19/23 70.6 kg  02/19/23 71.8 kg  11/06/22 87.1 kg  12/10/21 91.6 kg      Average Meal Intake no current documentation.  Nutritionally Relevant Medications: Scheduled Meds:  atorvastatin  40 mg Oral Daily   clopidogrel  75 mg Oral Daily   feeding supplement  237 mL Oral BID BM   multivitamin with minerals  1 tablet Oral Daily   senna-docusate  2 tablet Oral Daily    Labs Reviewed: Chem 7 reviewed CBG ranges from 138-87 mg/dL over the last 24 hours    NUTRITION - FOCUSED PHYSICAL EXAM:  Deferred  Diet Order:   Diet Order             Diet 2 gram sodium Room service appropriate? Yes with Assist; Fluid consistency: Thin  Diet effective now                   EDUCATION NEEDS:   Not appropriate for  education at this time  Skin:  Skin Assessment: Reviewed RN Assessment  Last BM:  10/23  Height:   Ht Readings from Last 1 Encounters:  02/19/23 5\' 9"  (1.753 m)    Weight:   Wt Readings from Last 1 Encounters:  02/19/23 70.6 kg    Ideal Body Weight:     BMI:  Body mass index is 22.98 kg/m.  Estimated Nutritional Needs:   Kcal:  2100-2500 kcal/d  Protein:  90-100 g/d  Fluid:  67ml/kcal    Jamelle Haring RDN, LDN Clinical Dietitian  RDN pager # available on Amion

## 2023-02-21 NOTE — Plan of Care (Signed)

## 2023-02-21 NOTE — Progress Notes (Signed)
PROGRESS NOTE    Jeff Wilson  NID:[id number] DOB: 22-Feb-1941 DOA: 02/19/2023 PCP: [name], MD   Brief Narrative:  This 82 years old male with PMH significant for hypertension, hyperlipidemia, TIA, dementia who was sent from neurology office for further evaluation of lethargy/confusion.  Patient was found to be weak /lethargic and hypotensive in neurologist office.  SBP noted was 70, then he was sent to the ED for further evaluation.  Workup in the ED reveals mildly elevated lactic acid and UA positive for UTI.  Patient is given IV fluids and started on broad-spectrum antibiotics with further improvement in his mentation.  Patient is admitted for further evaluation.  Assessment & Plan:   Principal Problem:   Hypotension Active Problems:   Essential hypertension   Dementia without behavioral disturbance (HCC)   TIA (transient ischemic attack)  Sepsis secondary to complicated UTI: Patient presented with hypotension, confusion consistent with encephalopathy. UA consistent with UTI.  Initiated on Vanco,  Flagyl and cefepime.  Antibiotics de-escalated to Rocephin. Blood pressure and mentation has significantly improved after patient received IV fluids and antibiotics. Continue IV Rocephin for UTI.  Blood cultures no growth so far, urine cultures no growth Continue IV gentle fluid hydration.  Sepsis physiology improving.    Acute metabolic encephalopathy: Likely secondary to sepsis/hypotension. CT head no acute intracranial abnormality found. Mental status has already improved with antibiotics.   HTN: Hold all antihypertensives until blood pressure stabilizes.   HLD Continue statin.   History of TIA Continue Statin/Plavix.   Dementia Delirium precautions: Continue supportive care.  No behavioral problems.   Chronic debility/deconditioning: Mostly bed to wheelchair bound-however able to transfer PT/OT/SLP eval   DVT prophylaxis: Lovenox Code Status: Full  code Family Communication: No family at bedside Disposition Plan:    Status is: Inpatient Remains inpatient appropriate because: Admitted for confusion, hypotension,  found to have sepsis secondary to UTI, He is started on antibiotics with improving mental status.   Consultants:  None  Procedures: CT head.  Antimicrobials: Anti-infectives (From admission, onward)    Start     Dose/Rate Route Frequency Ordered Stop   02/19/23 2200  cefTRIAXone (ROCEPHIN) 1 g in sodium chloride 0.9 % 100 mL IVPB        1 g 200 mL/hr over 30 Minutes Intravenous Every 24 hours 02/19/23 2119     02/19/23 1215  ceFEPIme (MAXIPIME) 2 g in sodium chloride 0.9 % 100 mL IVPB        2 g 200 mL/hr over 30 Minutes Intravenous  Once 02/19/23 1207 02/19/23 1338   02/19/23 1215  metroNIDAZOLE (FLAGYL) IVPB 500 mg        500 mg 100 mL/hr over 60 Minutes Intravenous  Once 02/19/23 1207 02/19/23 1422   02/19/23 1215  vancomycin (VANCOCIN) IVPB 1000 mg/200 mL premix  Status:  Discontinued        1,000 mg 200 mL/hr over 60 Minutes Intravenous  Once 02/19/23 1207 02/19/23 1208   02/19/23 1215  vancomycin (VANCOREADY) IVPB 1500 mg/300 mL        1,500 mg 150 mL/hr over 120 Minutes Intravenous  Once 02/19/23 1208 02/19/23 1628      Subjective: Patient was seen and examined at bedside.  Overnight events noted.   Patient reports doing much better. He is following commands.   He still has weakness in both lower extremities.He is getting antibiotics for UTI.   Objective: Vitals:   02/20/23 1601 02/20/23 2028 02/21/23 0423 02/21/23 0817  BP: 132/86  125/76 118/77 132/78  Pulse: 76 78 71 70  Resp:  18 17 18   Temp: 97.9 F (36.6 C) 98 F (36.7 C) 98.2 F (36.8 C) 97.7 F (36.5 C)  TempSrc: Oral Oral Oral   SpO2: 100% 99% 100% 100%  Weight:      Height:        Intake/Output Summary (Last 24 hours) at 02/21/2023 1054 Last data filed at 02/21/2023 1029 Gross per 24 hour  Intake 118 ml  Output 1700 ml  Net  -1582 ml   Filed Weights   02/19/23 1305 02/19/23 1618  Weight: 71.7 kg 70.6 kg    Examination:  General exam: Appears comfortable, deconditioned, not in any acute distress. Respiratory system: CTA bilaterally. Respiratory effort normal.  RR 15 Cardiovascular system: S1 & S2 heard, RRR. No JVD, murmurs, rubs, gallops or clicks. No pedal edema. Gastrointestinal system: Abdomen is non distended, soft and non tender. Normal bowel sounds heard. Central nervous system: Alert and oriented x 3.  Bilateral lower extremity weakness Extremities: No edema, no cyanosis, no clubbing Skin: No rashes, lesions or ulcers Psychiatry: Judgement and insight appear normal. Mood & affect appropriate.     Data Reviewed: I have personally reviewed following labs and imaging studies  CBC: Recent Labs  Lab 02/19/23 1252 02/20/23 0936  WBC 10.4 8.2  NEUTROABS 9.2*  --   HGB 12.2* 12.1*  HCT 37.8* 36.6*  MCV 85.5 84.3  PLT 185 172   Basic Metabolic Panel: Recent Labs  Lab 02/19/23 1252 02/20/23 0652  NA 143 139  K 3.2* 3.0*  CL 105 109  CO2 25 23  GLUCOSE 107* 138*  BUN 12 11  CREATININE 0.96 0.81  CALCIUM 9.7 8.5*   GFR: Estimated Creatinine Clearance: 72.6 mL/min (by C-G formula based on SCr of 0.81 mg/dL). Liver Function Tests: Recent Labs  Lab 02/19/23 1252 02/20/23 0652  AST 24 24  ALT 16 15  ALKPHOS 136* 104  BILITOT 1.1 0.6  PROT 7.3 5.8*  ALBUMIN 3.4* 2.6*   No results for input(s): "LIPASE", "AMYLASE" in the last 168 hours. No results for input(s): "AMMONIA" in the last 168 hours. Coagulation Profile: No results for input(s): "INR", "PROTIME" in the last 168 hours. Cardiac Enzymes: No results for input(s): "CKTOTAL", "CKMB", "CKMBINDEX", "TROPONINI" in the last 168 hours. BNP (last 3 results) No results for input(s): "PROBNP" in the last 8760 hours. HbA1C: No results for input(s): "HGBA1C" in the last 72 hours. CBG: No results for input(s): "GLUCAP" in the last  168 hours. Lipid Profile: No results for input(s): "CHOL", "HDL", "LDLCALC", "TRIG", "CHOLHDL", "LDLDIRECT" in the last 72 hours. Thyroid Function Tests: No results for input(s): "TSH", "T4TOTAL", "FREET4", "T3FREE", "THYROIDAB" in the last 72 hours. Anemia Panel: No results for input(s): "VITAMINB12", "FOLATE", "FERRITIN", "TIBC", "IRON", "RETICCTPCT" in the last 72 hours. Sepsis Labs: Recent Labs  Lab 02/19/23 1303 02/19/23 1448 02/21/23 1011  LATICACIDVEN 2.4* 2.3* 0.9    Recent Results (from the past 240 hour(s))  Blood Culture (routine x 2)     Status: None (Preliminary result)   Collection Time: 02/19/23 12:48 PM   Specimen: BLOOD  Result Value Ref Range Status   Specimen Description BLOOD SITE NOT SPECIFIED  Final   Special Requests   Final    BOTTLES DRAWN AEROBIC AND ANAEROBIC Blood Culture results may not be optimal due to an inadequate volume of blood received in culture bottles   Culture   Final    NO GROWTH 2 DAYS  Performed at [facility], [address], [place], [place] [address]    Report Status PENDING  Incomplete  Blood Culture (routine x 2)     Status: None (Preliminary result)   Collection Time: 02/19/23 12:51 PM   Specimen: BLOOD LEFT HAND  Result Value Ref Range Status   Specimen Description BLOOD LEFT HAND  Final   Special Requests   Final    BOTTLES DRAWN AEROBIC ONLY Blood Culture results may not be optimal due to an inadequate volume of blood received in culture bottles   Culture   Final    NO GROWTH 2 DAYS Performed at [facility], [address], [place], [place] [address]    Report Status PENDING  Incomplete  Resp panel by RT-PCR (RSV, Flu A&B, Covid) Anterior Nasal Swab     Status: None   Collection Time: 02/19/23 12:52 PM   Specimen: Anterior Nasal Swab  Result Value Ref Range Status   SARS Coronavirus 2 by RT PCR NEGATIVE NEGATIVE Final   Influenza A by PCR NEGATIVE NEGATIVE Final   Influenza B by PCR NEGATIVE NEGATIVE Final     Comment: (NOTE) The Xpert Xpress SARS-CoV-2/FLU/RSV plus assay is intended as an aid in the diagnosis of influenza from Nasopharyngeal swab specimens and should not be used as a sole basis for treatment. Nasal washings and aspirates are unacceptable for Xpert Xpress SARS-CoV-2/FLU/RSV testing.  Fact Sheet for Patients: BloggerCourse.com  Fact Sheet for Healthcare Providers: SeriousBroker.it  This test is not yet approved or cleared by the [place] FDA and has been authorized for detection and/or diagnosis of SARS-CoV-2 by FDA under an Emergency Use Authorization (EUA). This EUA will remain in effect (meaning this test can be used) for the duration of the COVID-19 declaration under Section 564(b)(1) of the Act, 21 U.S.C. section 360bbb-3(b)(1), unless the authorization is terminated or revoked.     Resp Syncytial Virus by PCR NEGATIVE NEGATIVE Final    Comment: (NOTE) Fact Sheet for Patients: BloggerCourse.com  Fact Sheet for Healthcare Providers: SeriousBroker.it  This test is not yet approved or cleared by the [place] FDA and has been authorized for detection and/or diagnosis of SARS-CoV-2 by FDA under an Emergency Use Authorization (EUA). This EUA will remain in effect (meaning this test can be used) for the duration of the COVID-19 declaration under Section 564(b)(1) of the Act, 21 U.S.C. section 360bbb-3(b)(1), unless the authorization is terminated or revoked.  Performed at [facility], [address], [place], [place] [address]   Urine Culture     Status: None   Collection Time: 02/19/23  1:39 PM   Specimen: Urine, Random  Result Value Ref Range Status   Specimen Description URINE, RANDOM  Final   Special Requests NONE Reflexed from [id number]  Final   Culture   Final    NO GROWTH Performed at [facility], [address] [place],  [place] [address]    Report Status 02/20/2023 FINAL  Final    Radiology Studies: CT Head Wo Contrast  Result Date: 02/19/2023 CLINICAL DATA:  Altered mental status EXAM: CT HEAD WITHOUT CONTRAST TECHNIQUE: Contiguous axial images were obtained from the base of the skull through the vertex without intravenous contrast. RADIATION DOSE REDUCTION: This exam was performed according to the departmental dose-optimization program which includes automated exposure control, adjustment of the mA and/or kV according to patient size and/or use of iterative reconstruction technique. COMPARISON:  11/06/2022 FINDINGS: Brain: No evidence of  acute infarction, hemorrhage, hydrocephalus, extra-axial collection or mass lesion/mass effect. Chronic atrophic and ischemic changes are noted. Vascular: No hyperdense vessel or unexpected calcification. Skull: Normal. Negative for fracture or focal lesion. Sinuses/Orbits: No acute finding. Other: None. IMPRESSION: Chronic atrophic and ischemic changes without acute abnormality. Electronically Signed   By: Alcide Clever M.D.   On: 02/19/2023 18:05   DG Chest Port 1 View  Result Date: 02/19/2023 CLINICAL DATA:  Sepsis EXAM: PORTABLE CHEST 1 VIEW COMPARISON:  11/06/2022 FINDINGS: Underinflation. Normal cardiopericardial silhouette. Overlapping cardiac leads. Degenerative changes of the spine. Subtle hazy opacity in the left lung base. Mild infiltrates possible. Recommend follow-up. No pneumothorax, edema or effusion. IMPRESSION: Underinflation with questionable mild opacity left lung base. Recommend follow-up Electronically Signed   By: Karen Kays M.D.   On: 02/19/2023 15:18    Scheduled Meds:  atorvastatin  40 mg Oral Daily   clopidogrel  75 mg Oral Daily   enoxaparin (LOVENOX) injection  40 mg Subcutaneous Q24H   multivitamin with minerals  1 tablet Oral Daily   polyethylene glycol  17 g Oral Daily   senna-docusate  2 tablet Oral Daily   Continuous Infusions:  cefTRIAXone  (ROCEPHIN)  IV 1 g (02/20/23 2241)     LOS: 2 days    Time spent: 35 mins    Willeen Niece, MD [facility]   If 7PM-7AM, please contact night-coverage

## 2023-02-22 DIAGNOSIS — E861 Hypovolemia: Secondary | ICD-10-CM | POA: Diagnosis not present

## 2023-02-22 MED ORDER — POTASSIUM CHLORIDE 20 MEQ PO PACK
40.0000 meq | PACK | Freq: Once | ORAL | Status: AC
  Administered 2023-02-22: 40 meq via ORAL
  Filled 2023-02-22: qty 2

## 2023-02-22 MED ORDER — CEPHALEXIN 500 MG PO CAPS: 500.0000 mg | ORAL_CAPSULE | Freq: Two times a day (BID) | ORAL | 0 refills | Status: AC

## 2023-02-22 NOTE — Progress Notes (Signed)
Patient ID: Jeff Wilson, male   DOB: May 27, 1940, 82 y.o.   MRN: [id number] Report called to [facility] and given to [facility] LPN.   Lidia Collum, RN

## 2023-02-22 NOTE — Discharge Summary (Signed)
Physician Discharge Summary  Jeff Wilson WUJ:[id number] DOB: 12-Feb-1941 DOA: 02/19/2023  PCP: [name], MD  Admit date: 02/19/2023  Discharge date: 02/22/2023  Admitted From: SNF [facility]) Disposition:  SNF  [facility])  Recommendations for Outpatient Follow-up:  Follow up with PCP in 1-2 weeks Please obtain BMP/CBC in one week Advised to take Keflex 500 mg twice daily for 7 days for UTI.  Home Health:None Equipment/Devices:None  Discharge Condition: Stable CODE STATUS:Full code Diet recommendation: Heart Healthy   Brief [facility] Course: This 82 years old male with PMH significant for hypertension, hyperlipidemia, TIA, dementia who was sent from neurology office for further evaluation of lethargy/confusion. Patient was found to be weak /lethargic and hypotensive in neurologist office. SBP noted was 70, then he was sent to the ED for further evaluation. Workup in the ED reveals mildly elevated lactic acid and UA positive for UTI. Patient is given IV fluids and started on broad-spectrum antibiotics with further improvement in his mentation. Patient was admitted for further evaluation.  Patient was continued on IV fluids and antibiotics.  Blood cultures no growth so far, urine cultures negative.  Antibiotics subsequently de-escalated to ceftriaxone. Patient seems back to his baseline mental status.  Patient is being discharged back to [facility]  Advised to take Keflex 500 mg twice daily for 7 days for UTI.   Discharge Diagnoses:  Principal Problem:   Hypotension Active Problems:   Essential hypertension   Dementia without behavioral disturbance (HCC)   TIA (transient ischemic attack)  Sepsis secondary to complicated UTI: Patient presented with hypotension, confusion consistent with encephalopathy. UA consistent with UTI.  Initiated on Vanco,  Flagyl and cefepime.  Antibiotics de-escalated to Rocephin. Blood pressure and mentation has significantly  improved after patient received IV fluids and antibiotics. Continue IV Rocephin for UTI.  Blood cultures no growth so far, urine cultures no growth Continue IV gentle fluid hydration.  Sepsis physiology resolved. Patient being discharged on Keflex 500 twice daily for 7 days.    Acute metabolic encephalopathy: Likely secondary to sepsis/hypotension. CT head no acute intracranial abnormality found. Mental status has already improved with antibiotics.   HTN: Resume home blood pressure medications.   HLD Continue statin.   History of TIA Continue Statin/Plavix.   Dementia Delirium precautions: Continue supportive care.  No behavioral problems.   Chronic debility/deconditioning: Mostly bed to wheelchair bound-however able to transfer PT/OT/SLP eval Patient is returned back to [facility].    Discharge Instructions  Discharge Instructions     Call MD for:  difficulty breathing, headache or visual disturbances   Complete by: As directed    Call MD for:  persistant dizziness or light-headedness   Complete by: As directed    Call MD for:  persistant nausea and vomiting   Complete by: As directed    Diet - low sodium heart healthy   Complete by: As directed    Diet Carb Modified   Complete by: As directed    Discharge instructions   Complete by: As directed    Advised to follow-up with primary care physician in 1 week. Advised to complete the Keflex 500 twice daily   Increase activity slowly   Complete by: As directed       Allergies as of 02/22/2023   No Known Allergies      Medication List     TAKE these medications    amLODipine 10 MG tablet Commonly known as: NORVASC Take 10 mg by mouth daily.  atorvastatin 40 MG tablet Commonly known as: LIPITOR Take 40 mg by mouth daily.   calcium carbonate 500 MG chewable tablet Commonly known as: TUMS - dosed in mg elemental calcium Chew 1 tablet by mouth daily.   cephALEXin 500 MG capsule Commonly known as:  KEFLEX Take 1 capsule (500 mg total) by mouth 2 (two) times daily for 7 days.   clopidogrel 75 MG tablet Commonly known as: PLAVIX Take 75 mg by mouth daily.   ketoconazole 2 % shampoo Commonly known as: NIZORAL Apply 1 Application topically. On Mondays every 2 weeks   multivitamin with minerals Tabs tablet Take 1 tablet by mouth daily.   polyethylene glycol 17 g packet Commonly known as: MIRALAX / GLYCOLAX Take 17 g by mouth daily.   sennosides-docusate sodium 8.6-50 MG tablet Commonly known as: SENOKOT-S Take 2 tablets by mouth daily.   Vitamin D (Ergocalciferol) 1.25 MG (50000 UNIT) Caps capsule Commonly known as: DRISDOL Take 50,000 Units by mouth every Monday.        Follow-up Information     [name], MD Follow up in 1 week(s).   Specialty: Family Medicine Contact information: [address] [place] [address] [phone]                No Known Allergies  Consultations: None   Procedures/Studies: CT Head Wo Contrast  Result Date: 02/19/2023 CLINICAL DATA:  Altered mental status EXAM: CT HEAD WITHOUT CONTRAST TECHNIQUE: Contiguous axial images were obtained from the base of the skull through the vertex without intravenous contrast. RADIATION DOSE REDUCTION: This exam was performed according to the departmental dose-optimization program which includes automated exposure control, adjustment of the mA and/or kV according to patient size and/or use of iterative reconstruction technique. COMPARISON:  11/06/2022 FINDINGS: Brain: No evidence of acute infarction, hemorrhage, hydrocephalus, extra-axial collection or mass lesion/mass effect. Chronic atrophic and ischemic changes are noted. Vascular: No hyperdense vessel or unexpected calcification. Skull: Normal. Negative for fracture or focal lesion. Sinuses/Orbits: No acute finding. Other: None. IMPRESSION: Chronic atrophic and ischemic changes without acute abnormality. Electronically Signed   By: Alcide Clever M.D.   On: 02/19/2023 18:05   DG Chest Port 1 View  Result Date: 02/19/2023 CLINICAL DATA:  Sepsis EXAM: PORTABLE CHEST 1 VIEW COMPARISON:  11/06/2022 FINDINGS: Underinflation. Normal cardiopericardial silhouette. Overlapping cardiac leads. Degenerative changes of the spine. Subtle hazy opacity in the left lung base. Mild infiltrates possible. Recommend follow-up. No pneumothorax, edema or effusion. IMPRESSION: Underinflation with questionable mild opacity left lung base. Recommend follow-up Electronically Signed   By: Karen Kays M.D.   On: 02/19/2023 15:18     Subjective: Patient was seen and examined at bedside. Overnight events noted.   Patient seems back to his baseline mental status.  Patient feels better,  wants to be discharged back to [facility].  Discharge Exam: Vitals:   02/22/23 0425 02/22/23 0926  BP: 136/78 (!) 140/86  Pulse: 66 70  Resp: 18 16  Temp: 98.6 F (37 C) 98.3 F (36.8 C)  SpO2: 99% 100%   Vitals:   02/21/23 1511 02/21/23 2034 02/22/23 0425 02/22/23 0926  BP: 121/82 121/79 136/78 (!) 140/86  Pulse: 70 72 66 70  Resp: 18 18 18 16   Temp: 98.4 F (36.9 C) 98.1 F (36.7 C) 98.6 F (37 C) 98.3 F (36.8 C)  TempSrc:  Oral  Oral  SpO2: 98% 99% 99% 100%  Weight:      Height:  General: Pt is alert, awake, not in acute distress Cardiovascular: RRR, S1/S2 +, no rubs, no gallops Respiratory: CTA bilaterally, no wheezing, no rhonchi Abdominal: Soft, NT, ND, bowel sounds + Extremities: no edema, no cyanosis    The results of significant diagnostics from this hospitalization (including imaging, microbiology, ancillary and laboratory) are listed below for reference.     Microbiology: Recent Results (from the past 240 hour(s))  Blood Culture (routine x 2)     Status: None (Preliminary result)   Collection Time: 02/19/23 12:48 PM   Specimen: BLOOD  Result Value Ref Range Status   Specimen Description BLOOD SITE NOT SPECIFIED  Final    Special Requests   Final    BOTTLES DRAWN AEROBIC AND ANAEROBIC Blood Culture results may not be optimal due to an inadequate volume of blood received in culture bottles   Culture   Final    NO GROWTH 3 DAYS Performed at [facility], [address] [place], [place] [address]    Report Status PENDING  Incomplete  Blood Culture (routine x 2)     Status: None (Preliminary result)   Collection Time: 02/19/23 12:51 PM   Specimen: BLOOD LEFT HAND  Result Value Ref Range Status   Specimen Description BLOOD LEFT HAND  Final   Special Requests   Final    BOTTLES DRAWN AEROBIC ONLY Blood Culture results may not be optimal due to an inadequate volume of blood received in culture bottles   Culture   Final    NO GROWTH 3 DAYS Performed at [facility], [address], [place], [place] [address]    Report Status PENDING  Incomplete  Resp panel by RT-PCR (RSV, Flu A&B, Covid) Anterior Nasal Swab     Status: None   Collection Time: 02/19/23 12:52 PM   Specimen: Anterior Nasal Swab  Result Value Ref Range Status   SARS Coronavirus 2 by RT PCR NEGATIVE NEGATIVE Final   Influenza A by PCR NEGATIVE NEGATIVE Final   Influenza B by PCR NEGATIVE NEGATIVE Final    Comment: (NOTE) The Xpert Xpress SARS-CoV-2/FLU/RSV plus assay is intended as an aid in the diagnosis of influenza from Nasopharyngeal swab specimens and should not be used as a sole basis for treatment. Nasal washings and aspirates are unacceptable for Xpert Xpress SARS-CoV-2/FLU/RSV testing.  Fact Sheet for Patients: BloggerCourse.com  Fact Sheet for Healthcare Providers: SeriousBroker.it  This test is not yet approved or cleared by the [place] FDA and has been authorized for detection and/or diagnosis of SARS-CoV-2 by FDA under an Emergency Use Authorization (EUA). This EUA will remain in effect (meaning this test can be used) for the duration of the COVID-19  declaration under Section 564(b)(1) of the Act, 21 U.S.C. section 360bbb-3(b)(1), unless the authorization is terminated or revoked.     Resp Syncytial Virus by PCR NEGATIVE NEGATIVE Final    Comment: (NOTE) Fact Sheet for Patients: BloggerCourse.com  Fact Sheet for Healthcare Providers: SeriousBroker.it  This test is not yet approved or cleared by the [place] FDA and has been authorized for detection and/or diagnosis of SARS-CoV-2 by FDA under an Emergency Use Authorization (EUA). This EUA will remain in effect (meaning this test can be used) for the duration of the COVID-19 declaration under Section 564(b)(1) of the Act, 21 U.S.C. section 360bbb-3(b)(1), unless the authorization is terminated or revoked.  Performed at [facility], [address] [place], [place] [address]   Urine Culture  Status: None   Collection Time: 02/19/23  1:39 PM   Specimen: Urine, Random  Result Value Ref Range Status   Specimen Description URINE, RANDOM  Final   Special Requests NONE Reflexed from [id number]  Final   Culture   Final    NO GROWTH Performed at [facility], [address], [place], [place] [address]    Report Status 02/20/2023 FINAL  Final     Labs: BNP (last 3 results) No results for input(s): "BNP" in the last 8760 hours. Basic Metabolic Panel: Recent Labs  Lab 02/19/23 1252 02/20/23 0652 02/21/23 0959  NA 143 139 142  K 3.2* 3.0* 3.4*  CL 105 109 106  CO2 25 23 30   GLUCOSE 107* 138* 87  BUN 12 11 5*  CREATININE 0.96 0.81 0.71  CALCIUM 9.7 8.5* 9.0  MG  --   --  1.8  PHOS  --   --  2.3*   Liver Function Tests: Recent Labs  Lab 02/19/23 1252 02/20/23 0652  AST 24 24  ALT 16 15  ALKPHOS 136* 104  BILITOT 1.1 0.6  PROT 7.3 5.8*  ALBUMIN 3.4* 2.6*   No results for input(s): "LIPASE", "AMYLASE" in the last 168 hours. No results for input(s): "AMMONIA" in the last 168 hours. CBC: Recent  Labs  Lab 02/19/23 1252 02/20/23 0936 02/21/23 0959  WBC 10.4 8.2 5.4  NEUTROABS 9.2*  --   --   HGB 12.2* 12.1* 11.9*  HCT 37.8* 36.6* 36.2*  MCV 85.5 84.3 84.6  PLT 185 172 172   Cardiac Enzymes: No results for input(s): "CKTOTAL", "CKMB", "CKMBINDEX", "TROPONINI" in the last 168 hours. BNP: Invalid input(s): "POCBNP" CBG: No results for input(s): "GLUCAP" in the last 168 hours. D-Dimer No results for input(s): "DDIMER" in the last 72 hours. Hgb A1c No results for input(s): "HGBA1C" in the last 72 hours. Lipid Profile No results for input(s): "CHOL", "HDL", "LDLCALC", "TRIG", "CHOLHDL", "LDLDIRECT" in the last 72 hours. Thyroid function studies No results for input(s): "TSH", "T4TOTAL", "T3FREE", "THYROIDAB" in the last 72 hours.  Invalid input(s): "FREET3" Anemia work up No results for input(s): "VITAMINB12", "FOLATE", "FERRITIN", "TIBC", "IRON", "RETICCTPCT" in the last 72 hours. Urinalysis    Component Value Date/Time   COLORURINE AMBER (A) 02/19/2023 1339   APPEARANCEUR CLOUDY (A) 02/19/2023 1339   LABSPEC 1.015 02/19/2023 1339   PHURINE 6.0 02/19/2023 1339   GLUCOSEU NEGATIVE 02/19/2023 1339   HGBUR MODERATE (A) 02/19/2023 1339   BILIRUBINUR NEGATIVE 02/19/2023 1339   KETONESUR NEGATIVE 02/19/2023 1339   PROTEINUR NEGATIVE 02/19/2023 1339   UROBILINOGEN 1.0 12/05/2008 2150   NITRITE NEGATIVE 02/19/2023 1339   LEUKOCYTESUR LARGE (A) 02/19/2023 1339   Sepsis Labs Recent Labs  Lab 02/19/23 1252 02/20/23 0936 02/21/23 0959  WBC 10.4 8.2 5.4   Microbiology Recent Results (from the past 240 hour(s))  Blood Culture (routine x 2)     Status: None (Preliminary result)   Collection Time: 02/19/23 12:48 PM   Specimen: BLOOD  Result Value Ref Range Status   Specimen Description BLOOD SITE NOT SPECIFIED  Final   Special Requests   Final    BOTTLES DRAWN AEROBIC AND ANAEROBIC Blood Culture results may not be optimal due to an inadequate volume of blood received  in culture bottles   Culture   Final    NO GROWTH 3 DAYS Performed at [facility], [address], [place], [place] [address]    Report Status PENDING  Incomplete  Blood Culture (routine x 2)     Status: None (Preliminary result)   Collection Time: 02/19/23 12:51 PM   Specimen: BLOOD LEFT HAND  Result Value Ref Range Status   Specimen Description BLOOD LEFT HAND  Final   Special Requests   Final    BOTTLES DRAWN AEROBIC ONLY Blood Culture results may not be optimal due to an inadequate volume of blood received in culture bottles   Culture   Final    NO GROWTH 3 DAYS Performed at [facility], [address], [place], [place] [address]    Report Status PENDING  Incomplete  Resp panel by RT-PCR (RSV, Flu A&B, Covid) Anterior Nasal Swab     Status: None   Collection Time: 02/19/23 12:52 PM   Specimen: Anterior Nasal Swab  Result Value Ref Range Status   SARS Coronavirus 2 by RT PCR NEGATIVE NEGATIVE Final   Influenza A by PCR NEGATIVE NEGATIVE Final   Influenza B by PCR NEGATIVE NEGATIVE Final    Comment: (NOTE) The Xpert Xpress SARS-CoV-2/FLU/RSV plus assay is intended as an aid in the diagnosis of influenza from Nasopharyngeal swab specimens and should not be used as a sole basis for treatment. Nasal washings and aspirates are unacceptable for Xpert Xpress SARS-CoV-2/FLU/RSV testing.  Fact Sheet for Patients: BloggerCourse.com  Fact Sheet for Healthcare Providers: SeriousBroker.it  This test is not yet approved or cleared by the [place] FDA and has been authorized for detection and/or diagnosis of SARS-CoV-2 by FDA under an Emergency Use Authorization (EUA). This EUA will remain in effect (meaning this test can be used) for the duration of the COVID-19 declaration under Section 564(b)(1) of the Act, 21 U.S.C. section 360bbb-3(b)(1), unless the authorization is terminated or revoked.     Resp Syncytial  Virus by PCR NEGATIVE NEGATIVE Final    Comment: (NOTE) Fact Sheet for Patients: BloggerCourse.com  Fact Sheet for Healthcare Providers: SeriousBroker.it  This test is not yet approved or cleared by the [place] FDA and has been authorized for detection and/or diagnosis of SARS-CoV-2 by FDA under an Emergency Use Authorization (EUA). This EUA will remain in effect (meaning this test can be used) for the duration of the COVID-19 declaration under Section 564(b)(1) of the Act, 21 U.S.C. section 360bbb-3(b)(1), unless the authorization is terminated or revoked.  Performed at [facility], [address] [place], [place] [address]   Urine Culture     Status: None   Collection Time: 02/19/23  1:39 PM   Specimen: Urine, Random  Result Value Ref Range Status   Specimen Description URINE, RANDOM  Final   Special Requests NONE Reflexed from [id number]  Final   Culture   Final    NO GROWTH Performed at [facility], [address], [place], [place] [address]    Report Status 02/20/2023 FINAL  Final     Time coordinating discharge: Over 30 minutes  SIGNED:   Willeen Niece, MD  [facility] 02/22/2023, 11:43 AM Pager   If 7PM-7AM, please contact night-coverage

## 2023-02-22 NOTE — Plan of Care (Signed)
  Problem: Education: Goal: Knowledge of General Education information will improve Description: Including pain rating scale, medication(s)/side effects and non-pharmacologic comfort measures Outcome: Progressing   Problem: Health Behavior/Discharge Planning: Goal: Ability to manage health-related needs will improve Outcome: Progressing   Problem: Clinical Measurements: Goal: Ability to maintain clinical measurements within normal limits will improve Outcome: Progressing Goal: Will remain free from infection Outcome: Progressing Goal: Diagnostic test results will improve Outcome: Progressing Goal: Respiratory complications will improve Outcome: Progressing Goal: Cardiovascular complication will be avoided Outcome: Progressing   Problem: Activity: Goal: Risk for activity intolerance will decrease Outcome: Progressing   Problem: Nutrition: Goal: Adequate nutrition will be maintained Outcome: Progressing   Problem: Coping: Goal: Level of anxiety will decrease Outcome: Progressing   Problem: Elimination: Goal: Will not experience complications related to bowel motility Outcome: Progressing Goal: Will not experience complications related to urinary retention Outcome: Progressing   Problem: Pain Management: Goal: General experience of comfort will improve Outcome: Progressing

## 2023-02-22 NOTE — Discharge Instructions (Signed)
Advised to follow-up with primary care physician in 1 week. Advised to take Keflex 500 mg twice daily for 7 days for UTI.

## 2023-02-22 NOTE — Plan of Care (Signed)
Patient ID: Jeff Wilson, male   DOB: Aug 30, 1940, 82 y.o.   MRN: [id number]  Problem: Education: Goal: Knowledge of General Education information will improve Description: Including pain rating scale, medication(s)/side effects and non-pharmacologic comfort measures Outcome: Adequate for Discharge   Problem: Health Behavior/Discharge Planning: Goal: Ability to manage health-related needs will improve Outcome: Adequate for Discharge   Problem: Clinical Measurements: Goal: Ability to maintain clinical measurements within normal limits will improve Outcome: Adequate for Discharge Goal: Will remain free from infection Outcome: Adequate for Discharge Goal: Diagnostic test results will improve Outcome: Adequate for Discharge Goal: Respiratory complications will improve Outcome: Adequate for Discharge Goal: Cardiovascular complication will be avoided Outcome: Adequate for Discharge   Problem: Activity: Goal: Risk for activity intolerance will decrease Outcome: Adequate for Discharge   Problem: Nutrition: Goal: Adequate nutrition will be maintained Outcome: Adequate for Discharge   Problem: Coping: Goal: Level of anxiety will decrease Outcome: Adequate for Discharge   Problem: Elimination: Goal: Will not experience complications related to bowel motility Outcome: Adequate for Discharge Goal: Will not experience complications related to urinary retention Outcome: Adequate for Discharge   Problem: Pain Management: Goal: General experience of comfort will improve Outcome: Adequate for Discharge   Problem: Safety: Goal: Ability to remain free from injury will improve Outcome: Adequate for Discharge   Problem: Skin Integrity: Goal: Risk for impaired skin integrity will decrease Outcome: Adequate for Discharge   Problem: Increased Nutrient Needs (NI-5.1) Goal: Food and/or nutrient delivery Description: Individualized approach for food/nutrient provision. Outcome: Adequate for  Discharge    Lidia Collum, RN

## 2023-02-22 NOTE — TOC Transition Note (Addendum)
Transition of Care [facility]) - CM/SW Discharge Note   Patient Details  Name: Jeff Wilson MRN: [id number] Date of Birth: 1940/12/26  [facility]) CM/SW Contact:  Inis Sizer, LCSW Phone Number: 02/22/2023, 11:29 AM   Clinical Narrative:    CSW spoke with [name] at [facility] who states the patient can return to the facility today.  Patient will go to room 403B. Patient will be transported via [facility] - pick up scheduled for 1pm. The number to call for report is  [phone]. Med necessity completed and printed to unit.   CSW attempted to reach patient's sister Jeff Wilson via phone without success - no voicemail option available.  CSW attempted to reach niece Jeff Wilson without success - a voicemail was left requesting a return call.  Final next level of care: Long Term Nursing Home Barriers to Discharge: Barriers Resolved   Patient Goals and CMS Choice      Discharge Placement                         Discharge Plan and Services Additional resources added to the After Visit Summary for                                       Social Determinants of Health (SDOH) Interventions SDOH Screenings   Food Insecurity: Patient Unable To Answer (02/21/2023)  Housing: High Risk (02/21/2023)  Transportation Needs: Patient Unable To Answer (02/21/2023)  Utilities: Patient Unable To Answer (02/21/2023)  Financial Resource Strain: Low Risk  (01/28/2019)   Received from [facility] visits prior to 06/29/2022., [facility] visits prior to 06/29/2022.  Social Connections: Socially Isolated (01/28/2019)   Received from [facility] visits prior to 06/29/2022., [facility] visits prior to 06/29/2022.  Tobacco Use: Medium Risk (02/21/2023)     Readmission Risk Interventions    12/17/2021   12:10 PM  Readmission Risk Prevention Plan  Transportation Screening Complete  PCP or Specialist  Appt within 5-7 Days Complete  Home Care Screening Complete  Medication Review (RN CM) Complete

## 2023-02-24 LAB — CULTURE, BLOOD (ROUTINE X 2)
Culture: NO GROWTH
Culture: NO GROWTH

## 2023-02-28 ENCOUNTER — Telehealth: Payer: Self-pay | Admitting: Diagnostic Neuroimaging

## 2023-02-28 NOTE — Telephone Encounter (Signed)
UPON NOTE REVIEW: I didn't find followup recommendations. Please advise

## 2023-02-28 NOTE — Telephone Encounter (Signed)
Jeff Wilson from [place] called wanting to know if the pt should r/s for a f/u. Please advise.

## 2023-03-03 NOTE — Telephone Encounter (Signed)
Phone roon: Please let jessica from [place] know to follow up at [facility],  [employer]

## 2023-03-10 NOTE — Telephone Encounter (Signed)
Called to speak to Jeff Wilson at [place] but had to LVM for her. Left detailed information and also office number to call back if she had any other questions.

## 2023-03-14 ENCOUNTER — Other Ambulatory Visit: Payer: Self-pay

## 2023-03-14 ENCOUNTER — Encounter (HOSPITAL_COMMUNITY): Payer: Self-pay | Admitting: Emergency Medicine

## 2023-03-14 ENCOUNTER — Emergency Department (HOSPITAL_COMMUNITY): Payer: Medicare HMO

## 2023-03-14 ENCOUNTER — Emergency Department (HOSPITAL_COMMUNITY)
Admission: EM | Admit: 2023-03-14 | Discharge: 2023-03-15 | Disposition: A | Discharge status: Home / Self Care | Payer: Medicare HMO | Attending: Emergency Medicine | Admitting: Emergency Medicine

## 2023-03-14 DIAGNOSIS — Z79899 Other long term (current) drug therapy: Secondary | ICD-10-CM | POA: Diagnosis not present

## 2023-03-14 DIAGNOSIS — Z7902 Long term (current) use of antithrombotics/antiplatelets: Secondary | ICD-10-CM | POA: Insufficient documentation

## 2023-03-14 DIAGNOSIS — R4182 Altered mental status, unspecified: Secondary | ICD-10-CM | POA: Diagnosis present

## 2023-03-14 DIAGNOSIS — F0390 Unspecified dementia without behavioral disturbance: Secondary | ICD-10-CM | POA: Diagnosis not present

## 2023-03-14 DIAGNOSIS — R404 Transient alteration of awareness: Secondary | ICD-10-CM | POA: Insufficient documentation

## 2023-03-14 LAB — COMPREHENSIVE METABOLIC PANEL
ALT: 16 U/L (ref 0–44)
AST: 21 U/L (ref 15–41)
Albumin: 3.6 g/dL (ref 3.5–5.0)
Alkaline Phosphatase: 119 U/L (ref 38–126)
Anion gap: 8 (ref 5–15)
BUN: 15 mg/dL (ref 8–23)
CO2: 29 mmol/L (ref 22–32)
Calcium: 9.2 mg/dL (ref 8.9–10.3)
Chloride: 105 mmol/L (ref 98–111)
Creatinine, Ser: 0.77 mg/dL (ref 0.61–1.24)
GFR, Estimated: >60 mL/min (ref >60)
Glucose, Bld: 101 mg/dL — ABNORMAL HIGH (ref 70–99)
Potassium: 3.1 mmol/L — ABNORMAL LOW (ref 3.5–5.1)
Sodium: 142 mmol/L (ref 135–145)
Total Bilirubin: 0.7 mg/dL (ref <1.2)
Total Protein: 7.6 g/dL (ref 6.5–8.1)

## 2023-03-14 LAB — URINALYSIS, ROUTINE W REFLEX MICROSCOPIC
Bilirubin Urine: NEGATIVE
Glucose, UA: NEGATIVE mg/dL
Hgb urine dipstick: NEGATIVE
Ketones, ur: NEGATIVE mg/dL
Leukocytes,Ua: NEGATIVE
Nitrite: NEGATIVE
Protein, ur: NEGATIVE mg/dL
Specific Gravity, Urine: 1.013 (ref 1.005–1.030)
pH: 7 (ref 5.0–8.0)

## 2023-03-14 LAB — CBC WITH DIFFERENTIAL/PLATELET
Abs Immature Granulocytes: 0.02 10*3/uL (ref 0.00–0.07)
Basophils Absolute: 0 10*3/uL (ref 0.0–0.1)
Basophils Relative: 1 %
Eosinophils Absolute: 0.1 10*3/uL (ref 0.0–0.5)
Eosinophils Relative: 2 %
HCT: 36.9 % — ABNORMAL LOW (ref 39.0–52.0)
Hemoglobin: 12 g/dL — ABNORMAL LOW (ref 13.0–17.0)
Immature Granulocytes: 0 %
Lymphocytes Relative: 17 %
Lymphs Abs: 1.3 10*3/uL (ref 0.7–4.0)
MCH: 28.5 pg (ref 26.0–34.0)
MCHC: 32.5 g/dL (ref 30.0–36.0)
MCV: 87.6 fL (ref 80.0–100.0)
Monocytes Absolute: 0.5 10*3/uL (ref 0.1–1.0)
Monocytes Relative: 6 %
Neutro Abs: 5.5 10*3/uL (ref 1.7–7.7)
Neutrophils Relative %: 74 %
Platelets: 195 10*3/uL (ref 150–400)
RBC: 4.21 MIL/uL — ABNORMAL LOW (ref 4.22–5.81)
RDW: 15 % (ref 11.5–15.5)
WBC: 7.5 10*3/uL (ref 4.0–10.5)
nRBC: 0 % (ref 0.0–0.2)

## 2023-03-14 LAB — BLOOD GAS, VENOUS
Acid-Base Excess: 10.3 mmol/L — ABNORMAL HIGH (ref 0.0–2.0)
Bicarbonate: 37 mmol/L — ABNORMAL HIGH (ref 20.0–28.0)
O2 Saturation: 40.3 %
Patient temperature: 37
pCO2, Ven: 57 mm[Hg] (ref 44–60)
pH, Ven: 7.42 (ref 7.25–7.43)
pO2, Ven: <31 mm[Hg] — CL (ref 32–45)

## 2023-03-14 LAB — I-STAT CG4 LACTIC ACID, ED: Lactic Acid, Venous: 1.3 mmol/L (ref 0.5–1.9)

## 2023-03-14 LAB — CBG MONITORING, ED: Glucose-Capillary: 99 mg/dL (ref 70–99)

## 2023-03-14 LAB — AMMONIA: Ammonia: 11 umol/L (ref 9–35)

## 2023-03-14 MED ORDER — LACTATED RINGERS IV BOLUS
1000.0000 mL | Freq: Once | INTRAVENOUS | Status: AC
  Administered 2023-03-14: 1000 mL via INTRAVENOUS

## 2023-03-14 NOTE — Discharge Instructions (Signed)
Follow up with your family doc in the office.  

## 2023-03-14 NOTE — ED Triage Notes (Signed)
  Patient BIB EMS from [facility] for AMS.  Patient has baseline dementia but facility states he is acting differently than usual.  Were concerned for possible UTI.  Patient able to express some needs but confused otherwise.

## 2023-03-14 NOTE — ED Notes (Signed)
Date and time results received: 03/14/23 2105 (use smartphrase ".now" to insert current time)  Test: P02 Critical Value: < 31  Name of Provider Notified: Adela Lank  Orders Received? Or Actions Taken?:

## 2023-03-14 NOTE — ED Provider Notes (Signed)
[place] EMERGENCY DEPARTMENT AT [facility] Provider Note   CSN: [id number] Arrival date & time: 03/14/23  1954     History  Chief Complaint  Patient presents with   Altered Mental Status    Jeff Wilson is a 82 y.o. male.  82 yo M with a cc of altered mental status.  The patient is unable to provide any history.  Per EMS report the patient normally is able to do some ADLs send able to indicate that he needs basic things.  Has not been able to do that for at least the past 24 hours.  Level 5 caveat dementia.   Altered Mental Status      Home Medications Prior to Admission medications   Medication Sig Start Date End Date Taking? Authorizing Provider  amLODipine (NORVASC) 10 MG tablet Take 10 mg by mouth daily.    [provider]  atorvastatin (LIPITOR) 40 MG tablet Take 40 mg by mouth daily. 02/18/23   [provider]  calcium carbonate (TUMS - DOSED IN MG ELEMENTAL CALCIUM) 500 MG chewable tablet Chew 1 tablet by mouth daily.    [provider]  clopidogrel (PLAVIX) 75 MG tablet Take 75 mg by mouth daily.    [provider]  ketoconazole (NIZORAL) 2 % shampoo Apply 1 Application topically. On Mondays every 2 weeks    [provider]  Multiple Vitamin (MULTIVITAMIN WITH MINERALS) TABS tablet Take 1 tablet by mouth daily. 12/18/21   Standley Brooking, MD  polyethylene glycol (MIRALAX / GLYCOLAX) 17 g packet Take 17 g by mouth daily.    [provider]  sennosides-docusate sodium (SENOKOT-S) 8.6-50 MG tablet Take 2 tablets by mouth daily.    [provider]  Vitamin D, Ergocalciferol, (DRISDOL) 1.25 MG (50000 UNIT) CAPS capsule Take 50,000 Units by mouth every Monday.    [provider]      Allergies    Patient has no known allergies.    Review of Systems   Review of Systems  Physical Exam Updated Vital Signs BP 136/69   Pulse 65   Temp 98.4 F (36.9 C) (Oral)   Resp 14   Ht 5\' 9"   (1.753 m)   Wt 70.3 kg   SpO2 100%   BMI 22.89 kg/m  Physical Exam Vitals and nursing note reviewed.  Constitutional:      Appearance: He is well-developed.  HENT:     Head: Normocephalic and atraumatic.  Eyes:     Pupils: Pupils are equal, round, and reactive to light.  Neck:     Vascular: No JVD.  Cardiovascular:     Rate and Rhythm: Normal rate and regular rhythm.     Heart sounds: No murmur heard.    No friction rub. No gallop.  Pulmonary:     Effort: No respiratory distress.     Breath sounds: No wheezing.  Abdominal:     General: There is no distension.     Tenderness: There is no abdominal tenderness. There is no guarding or rebound.  Musculoskeletal:        General: Normal range of motion.     Cervical back: Normal range of motion and neck supple.     Comments: Patient is constantly grinding his teeth.  There is no obvious issue intraorally on exam.  He has tense musculature diffusely.  Skin:    Coloration: Skin is not pale.     Findings: No rash.  Neurological:     Mental  Status: He is alert and oriented to person, place, and time.  Psychiatric:        Behavior: Behavior normal.     ED Results / Procedures / Treatments   Labs (all labs ordered are listed, but only abnormal results are displayed) Labs Reviewed  COMPREHENSIVE METABOLIC PANEL - Abnormal; Notable for the following components:      Result Value   Potassium 3.1 (*)    Glucose, Bld 101 (*)    All other components within normal limits  CBC WITH DIFFERENTIAL/PLATELET - Abnormal; Notable for the following components:   RBC 4.21 (*)    Hemoglobin 12.0 (*)    HCT 36.9 (*)    All other components within normal limits  URINALYSIS, ROUTINE W REFLEX MICROSCOPIC - Abnormal; Notable for the following components:   APPearance CLOUDY (*)    All other components within normal limits  BLOOD GAS, VENOUS - Abnormal; Notable for the following components:   pO2, Ven <31 (*)    Bicarbonate 37.0 (*)     Acid-Base Excess 10.3 (*)    All other components within normal limits  CULTURE, BLOOD (ROUTINE X 2)  CULTURE, BLOOD (ROUTINE X 2)  AMMONIA  ETHANOL  CBG MONITORING, ED  I-STAT CG4 LACTIC ACID, ED    EKG EKG Interpretation Date/Time:  Friday March 14 2023 20:06:43 EST Ventricular Rate:  79 PR Interval:    QRS Duration:  98 QT Interval:  380 QTC Calculation: 436 R Axis:   -44  Text Interpretation: Accelerated junctional rhythm Left anterior fascicular block Abnormal R-wave progression, early transition Consider anterior infarct No significant change since last tracing Confirmed by Melene Plan [phone]) on 03/14/2023 9:33:49 PM  Radiology No results found.  Procedures Procedures    Medications Ordered in ED Medications  lactated ringers bolus 1,000 mL (1,000 mLs Intravenous New Bag/Given 03/14/23 2120)    ED Course/ Medical Decision Making/ A&P                                 Medical Decision Making Amount and/or Complexity of Data Reviewed Labs: ordered. Radiology: ordered.   48 yoM with a chief complaints of altered mental status.  The patient has dementia and is in a long-term care facility.  He is reportedly bedbound at baseline.  Indicates when he needs something.  Reportedly has not been doing that over the past 24 hours.  Will obtain an altered mental status workup.  Reassess.  UA negative for infection.  Ammonia level normal lactate normal.  Potassium is mildly low but appears to be at baseline.  No obvious renal change.  No significant anemia.  No hypercarbia.  CT of the head without acute intracranial hemorrhage.  Chest x-ray independently interpreted by me without focal infiltrate or pneumothorax.  Patient has had some improvement of his mental status since he has been here.  Was conversant with nursing.  Responding to me and able to nod yes or no.  I am not sure the cause of his initial symptoms.  With no obvious organic cause and him likely being back at  baseline will discharge back to his facility.  10:19 PM:  I have discussed the diagnosis/risks/treatment options with the patient.  Evaluation and diagnostic testing in the emergency department does not suggest an emergent condition requiring admission or immediate intervention beyond what has been performed at this time.  They will follow up with PCP. We also discussed  returning to the ED immediately if new or worsening sx occur. We discussed the sx which are most concerning (e.g., sudden worsening pain, fever, inability to tolerate by mouth) that necessitate immediate return. Medications administered to the patient during their visit and any new prescriptions provided to the patient are listed below.  Medications given during this visit Medications  lactated ringers bolus 1,000 mL (1,000 mLs Intravenous New Bag/Given 03/14/23 2120)     The patient appears reasonably screen and/or stabilized for discharge and I doubt any other medical condition or other [facility] requiring further screening, evaluation, or treatment in the ED at this time prior to discharge.          Final Clinical Impression(s) / ED Diagnoses Final diagnoses:  Transient alteration of awareness    Rx / DC Orders ED Discharge Orders     None         Melene Plan, DO 03/14/23 2219

## 2023-03-16 ENCOUNTER — Other Ambulatory Visit: Payer: Self-pay

## 2023-03-16 ENCOUNTER — Emergency Department (HOSPITAL_COMMUNITY): Payer: Medicare HMO

## 2023-03-16 ENCOUNTER — Observation Stay (HOSPITAL_COMMUNITY)
Admission: EM | Admit: 2023-03-16 | Discharge: 2023-03-19 | Disposition: A | Discharge status: Skilled Nursing Facility | Payer: Medicare HMO | Attending: Internal Medicine | Admitting: Internal Medicine

## 2023-03-16 ENCOUNTER — Encounter (HOSPITAL_COMMUNITY): Payer: Self-pay

## 2023-03-16 DIAGNOSIS — R404 Transient alteration of awareness: Secondary | ICD-10-CM | POA: Diagnosis not present

## 2023-03-16 DIAGNOSIS — Z8673 Personal history of transient ischemic attack (TIA), and cerebral infarction without residual deficits: Secondary | ICD-10-CM | POA: Insufficient documentation

## 2023-03-16 DIAGNOSIS — F0390 Unspecified dementia without behavioral disturbance: Secondary | ICD-10-CM | POA: Diagnosis not present

## 2023-03-16 DIAGNOSIS — E782 Mixed hyperlipidemia: Secondary | ICD-10-CM | POA: Diagnosis not present

## 2023-03-16 DIAGNOSIS — I10 Essential (primary) hypertension: Secondary | ICD-10-CM | POA: Insufficient documentation

## 2023-03-16 DIAGNOSIS — Z87891 Personal history of nicotine dependence: Secondary | ICD-10-CM | POA: Insufficient documentation

## 2023-03-16 DIAGNOSIS — R4182 Altered mental status, unspecified: Principal | ICD-10-CM | POA: Insufficient documentation

## 2023-03-16 DIAGNOSIS — R410 Disorientation, unspecified: Secondary | ICD-10-CM

## 2023-03-16 DIAGNOSIS — E876 Hypokalemia: Secondary | ICD-10-CM | POA: Diagnosis not present

## 2023-03-16 DIAGNOSIS — M6281 Muscle weakness (generalized): Secondary | ICD-10-CM | POA: Diagnosis not present

## 2023-03-16 DIAGNOSIS — Z7902 Long term (current) use of antithrombotics/antiplatelets: Secondary | ICD-10-CM | POA: Insufficient documentation

## 2023-03-16 DIAGNOSIS — Z79899 Other long term (current) drug therapy: Secondary | ICD-10-CM | POA: Insufficient documentation

## 2023-03-16 DIAGNOSIS — J189 Pneumonia, unspecified organism: Secondary | ICD-10-CM

## 2023-03-16 DIAGNOSIS — G9341 Metabolic encephalopathy: Secondary | ICD-10-CM | POA: Diagnosis not present

## 2023-03-16 DIAGNOSIS — N390 Urinary tract infection, site not specified: Secondary | ICD-10-CM

## 2023-03-16 LAB — CBC WITH DIFFERENTIAL/PLATELET
Abs Immature Granulocytes: 0.02 10*3/uL (ref 0.00–0.07)
Basophils Absolute: 0 10*3/uL (ref 0.0–0.1)
Basophils Relative: 1 %
Eosinophils Absolute: 0.1 10*3/uL (ref 0.0–0.5)
Eosinophils Relative: 1 %
HCT: 38.9 % — ABNORMAL LOW (ref 39.0–52.0)
Hemoglobin: 12.7 g/dL — ABNORMAL LOW (ref 13.0–17.0)
Immature Granulocytes: 0 %
Lymphocytes Relative: 22 %
Lymphs Abs: 1.2 10*3/uL (ref 0.7–4.0)
MCH: 27.8 pg (ref 26.0–34.0)
MCHC: 32.6 g/dL (ref 30.0–36.0)
MCV: 85.1 fL (ref 80.0–100.0)
Monocytes Absolute: 0.4 10*3/uL (ref 0.1–1.0)
Monocytes Relative: 7 %
Neutro Abs: 3.8 10*3/uL (ref 1.7–7.7)
Neutrophils Relative %: 69 %
Platelets: 207 10*3/uL (ref 150–400)
RBC: 4.57 MIL/uL (ref 4.22–5.81)
RDW: 14.9 % (ref 11.5–15.5)
WBC: 5.5 10*3/uL (ref 4.0–10.5)
nRBC: 0 % (ref 0.0–0.2)

## 2023-03-16 LAB — I-STAT CG4 LACTIC ACID, ED: Lactic Acid, Venous: 1 mmol/L (ref 0.5–1.9)

## 2023-03-16 LAB — URINALYSIS, W/ REFLEX TO CULTURE (INFECTION SUSPECTED)
Bilirubin Urine: NEGATIVE
Glucose, UA: NEGATIVE mg/dL
Ketones, ur: NEGATIVE mg/dL
Nitrite: NEGATIVE
Protein, ur: NEGATIVE mg/dL
RBC / HPF: >50 RBC/hpf (ref 0–5)
Specific Gravity, Urine: 1.02 (ref 1.005–1.030)
pH: 6.5 (ref 5.0–8.0)

## 2023-03-16 LAB — COMPREHENSIVE METABOLIC PANEL
ALT: 15 U/L (ref 0–44)
AST: 23 U/L (ref 15–41)
Albumin: 3.1 g/dL — ABNORMAL LOW (ref 3.5–5.0)
Alkaline Phosphatase: 120 U/L (ref 38–126)
Anion gap: 11 (ref 5–15)
BUN: 11 mg/dL (ref 8–23)
CO2: 26 mmol/L (ref 22–32)
Calcium: 9.4 mg/dL (ref 8.9–10.3)
Chloride: 104 mmol/L (ref 98–111)
Creatinine, Ser: 0.96 mg/dL (ref 0.61–1.24)
GFR, Estimated: >60 mL/min (ref >60)
Glucose, Bld: 143 mg/dL — ABNORMAL HIGH (ref 70–99)
Potassium: 3.2 mmol/L — ABNORMAL LOW (ref 3.5–5.1)
Sodium: 141 mmol/L (ref 135–145)
Total Bilirubin: 0.8 mg/dL (ref <1.2)
Total Protein: 6.7 g/dL (ref 6.5–8.1)

## 2023-03-16 LAB — TSH: TSH: 3.744 u[IU]/mL (ref 0.350–4.500)

## 2023-03-16 MED ORDER — POTASSIUM CHLORIDE CRYS ER 20 MEQ PO TBCR
40.0000 meq | EXTENDED_RELEASE_TABLET | Freq: Once | ORAL | Status: AC
  Administered 2023-03-16: 40 meq via ORAL
  Filled 2023-03-16: qty 2

## 2023-03-16 MED ORDER — ATORVASTATIN CALCIUM 40 MG PO TABS
40.0000 mg | ORAL_TABLET | Freq: Every day | ORAL | Status: DC
  Administered 2023-03-17 – 2023-03-19 (×3): 40 mg via ORAL
  Filled 2023-03-16 (×3): qty 1

## 2023-03-16 MED ORDER — POLYETHYLENE GLYCOL 3350 17 G PO PACK
17.0000 g | PACK | Freq: Every day | ORAL | Status: DC
  Administered 2023-03-17: 17 g via ORAL
  Filled 2023-03-16 (×2): qty 1

## 2023-03-16 MED ORDER — CLOPIDOGREL BISULFATE 75 MG PO TABS
75.0000 mg | ORAL_TABLET | Freq: Every day | ORAL | Status: DC
  Administered 2023-03-17 – 2023-03-19 (×3): 75 mg via ORAL
  Filled 2023-03-16 (×3): qty 1

## 2023-03-16 MED ORDER — AMLODIPINE BESYLATE 10 MG PO TABS
10.0000 mg | ORAL_TABLET | Freq: Every day | ORAL | Status: DC
  Administered 2023-03-17: 10 mg via ORAL
  Filled 2023-03-16: qty 1

## 2023-03-16 MED ORDER — SENNOSIDES-DOCUSATE SODIUM 8.6-50 MG PO TABS
2.0000 | ORAL_TABLET | Freq: Every day | ORAL | Status: DC
  Administered 2023-03-17 – 2023-03-18 (×2): 2 via ORAL
  Filled 2023-03-16 (×2): qty 2

## 2023-03-16 MED ORDER — AZITHROMYCIN 500 MG IV SOLR
500.0000 mg | Freq: Once | INTRAVENOUS | Status: AC
  Administered 2023-03-16: 500 mg via INTRAVENOUS
  Filled 2023-03-16: qty 5

## 2023-03-16 MED ORDER — ENOXAPARIN SODIUM 40 MG/0.4ML IJ SOSY
40.0000 mg | PREFILLED_SYRINGE | Freq: Every day | INTRAMUSCULAR | Status: DC
Start: 2023-03-17 — End: 2023-03-19
  Administered 2023-03-17 – 2023-03-19 (×3): 40 mg via SUBCUTANEOUS
  Filled 2023-03-16 (×3): qty 0.4

## 2023-03-16 MED ORDER — CEFTRIAXONE SODIUM 1 G IJ SOLR
1.0000 g | Freq: Once | INTRAMUSCULAR | Status: AC
  Administered 2023-03-16: 1 g via INTRAVENOUS
  Filled 2023-03-16: qty 10

## 2023-03-16 MED ORDER — SODIUM CHLORIDE 0.9 % IV SOLN
1.0000 g | INTRAVENOUS | Status: DC
  Administered 2023-03-17 – 2023-03-18 (×2): 1 g via INTRAVENOUS
  Filled 2023-03-16 (×2): qty 10

## 2023-03-16 NOTE — Assessment & Plan Note (Signed)
-   Give oral potassium replacement.

## 2023-03-16 NOTE — Assessment & Plan Note (Signed)
-   Continue amlodipine ?

## 2023-03-16 NOTE — ED Provider Notes (Signed)
[place] EMERGENCY DEPARTMENT AT [facility] Provider Note   CSN: [id number] Arrival date & time: 03/16/23  1446     History  Chief Complaint  Patient presents with   Altered Mental Status    Jeff Wilson is a 82 y.o. male.  The history is provided by the patient, medical records and the EMS personnel (ems report to nursing). The history is limited by the condition of the patient.  Altered Mental Status Presenting symptoms: partial responsiveness and unresponsiveness   Severity:  Moderate Most recent episode:  Today Episode history:  Unable to specify Timing:  Intermittent Progression:  Waxing and waning Chronicity:  Recurrent Context: dementia and nursing home resident   Associated symptoms: no abdominal pain, no agitation, no difficulty breathing, no headaches, no light-headedness, no nausea, no rash, no seizures and no vomiting        Home Medications Prior to Admission medications   Medication Sig Start Date End Date Taking? Authorizing Provider  amLODipine (NORVASC) 10 MG tablet Take 10 mg by mouth daily.    [provider]  atorvastatin (LIPITOR) 40 MG tablet Take 40 mg by mouth daily. 02/18/23   [provider]  calcium carbonate (TUMS - DOSED IN MG ELEMENTAL CALCIUM) 500 MG chewable tablet Chew 1 tablet by mouth daily.    [provider]  clopidogrel (PLAVIX) 75 MG tablet Take 75 mg by mouth daily.    [provider]  ketoconazole (NIZORAL) 2 % shampoo Apply 1 Application topically. On Mondays every 2 weeks    [provider]  Multiple Vitamin (MULTIVITAMIN WITH MINERALS) TABS tablet Take 1 tablet by mouth daily. 12/18/21   Standley Brooking, MD  polyethylene glycol (MIRALAX / GLYCOLAX) 17 g packet Take 17 g by mouth daily.    [provider]  sennosides-docusate sodium (SENOKOT-S) 8.6-50 MG tablet Take 2 tablets by mouth daily.    [provider]  Vitamin D, Ergocalciferol, (DRISDOL) 1.25  MG (50000 UNIT) CAPS capsule Take 50,000 Units by mouth every Monday.    [provider]      Allergies    Patient has no known allergies.    Review of Systems   Review of Systems  Unable to perform ROS: Dementia (dementia and AMS)  Constitutional:  Positive for fatigue.  HENT:  Negative for congestion.   Respiratory:  Negative for cough, chest tightness and shortness of breath.   Cardiovascular:  Negative for chest pain.  Gastrointestinal:  Negative for abdominal pain, constipation, diarrhea, nausea and vomiting.  Genitourinary:  Negative for dysuria.  Musculoskeletal:  Negative for back pain.  Skin:  Negative for rash and wound.  Neurological:  Negative for seizures, light-headedness and headaches.  Psychiatric/Behavioral:  Negative for agitation.     Physical Exam Updated Vital Signs BP 129/77   Pulse 95   Temp 99 F (37.2 C) (Oral)   Resp 20   SpO2 98%  Physical Exam Vitals and nursing note reviewed.  Constitutional:      General: He is not in acute distress.    Appearance: He is well-developed. He is not ill-appearing, toxic-appearing or diaphoretic.  HENT:     Head: Normocephalic and atraumatic.     Nose: No congestion or rhinorrhea.     Mouth/Throat:     Mouth: Mucous membranes are dry.     Pharynx: No oropharyngeal exudate or posterior oropharyngeal erythema.  Eyes:     Extraocular Movements: Extraocular movements intact.     Conjunctiva/sclera: Conjunctivae normal.  Pupils: Pupils are equal, round, and reactive to light.  Cardiovascular:     Rate and Rhythm: Regular rhythm. Tachycardia present.     Heart sounds: No murmur heard. Pulmonary:     Effort: Pulmonary effort is normal. No respiratory distress.     Breath sounds: Rhonchi present. No wheezing or rales.  Chest:     Chest wall: No tenderness.  Abdominal:     Palpations: Abdomen is soft.     Tenderness: There is no abdominal tenderness. There is no guarding or rebound.  Musculoskeletal:         General: No swelling or tenderness.     Cervical back: Neck supple. No tenderness.     Right lower leg: No edema.     Left lower leg: No edema.  Skin:    General: Skin is warm and dry.     Capillary Refill: Capillary refill takes less than 2 seconds.     Findings: No erythema or rash.  Neurological:     Mental Status: He is alert.     Motor: No weakness.     ED Results / Procedures / Treatments   Labs (all labs ordered are listed, but only abnormal results are displayed) Labs Reviewed  CBC WITH DIFFERENTIAL/PLATELET - Abnormal; Notable for the following components:      Result Value   Hemoglobin 12.7 (*)    HCT 38.9 (*)    All other components within normal limits  COMPREHENSIVE METABOLIC PANEL - Abnormal; Notable for the following components:   Potassium 3.2 (*)    Glucose, Bld 143 (*)    Albumin 3.1 (*)    All other components within normal limits  URINALYSIS, W/ REFLEX TO CULTURE (INFECTION SUSPECTED) - Abnormal; Notable for the following components:   Hgb urine dipstick MODERATE (*)    Leukocytes,Ua TRACE (*)    Bacteria, UA RARE (*)    All other components within normal limits  CULTURE, BLOOD (ROUTINE X 2)  CULTURE, BLOOD (ROUTINE X 2)  URINE CULTURE  TSH  I-STAT CG4 LACTIC ACID, ED  I-STAT CG4 LACTIC ACID, ED    EKG EKG Interpretation Date/Time:  Sunday March 16 2023 14:59:45 EST Ventricular Rate:  98 PR Interval:  185 QRS Duration:  90 QT Interval:  324 QTC Calculation: 414 R Axis:   -49  Text Interpretation: Sinus rhythm Left anterior fascicular block Abnormal R-wave progression, early transition Consider anterior infarct when compared to prior, overall similar appearance. No STEMI Confirmed by Theda Belfast ([id number]) on 03/16/2023 3:08:23 PM  Radiology CT HEAD WO CONTRAST ( )  Result Date: 03/16/2023 CLINICAL DATA:  Mental status change, persistent or worsening EXAM: CT HEAD WITHOUT CONTRAST TECHNIQUE: Contiguous axial images were  obtained from the base of the skull through the vertex without intravenous contrast. RADIATION DOSE REDUCTION: This exam was performed according to the departmental dose-optimization program which includes automated exposure control, adjustment of the mA and/or kV according to patient size and/or use of iterative reconstruction technique. COMPARISON:  03/14/2023 FINDINGS: Brain: No evidence of acute infarction, hemorrhage, hydrocephalus, extra-axial collection or mass lesion/mass effect. Patchy low-density changes within the periventricular and subcortical white matter most compatible with chronic microvascular ischemic change. Mild diffuse cerebral volume loss. Vascular: Atherosclerotic calcifications involving the large vessels of the skull base. No unexpected hyperdense vessel. Skull: Normal. Negative for fracture or focal lesion. Sinuses/Orbits: No acute finding. Other: None. IMPRESSION: 1. No acute intracranial findings. 2. Chronic microvascular ischemic change and cerebral volume loss. Electronically Signed  By: Duanne Guess D.O.   On: 03/16/2023 17:00   DG Chest Portable 1 View  Result Date: 03/16/2023 CLINICAL DATA:  Altered mental status EXAM: PORTABLE CHEST 1 VIEW COMPARISON:  Chest radiograph dated 03/14/2023 FINDINGS: Mildly low lung volumes. Minimal bibasilar patchy opacities. No pleural effusion or pneumothorax. The heart size and mediastinal contours are within normal limits. No acute osseous abnormality. IMPRESSION: Mildly low lung volumes with minimal bibasilar patchy opacities, likely atelectasis. Electronically Signed   By: Agustin Cree M.D.   On: 03/16/2023 15:57    Procedures Procedures    Medications Ordered in ED Medications  cefTRIAXone (ROCEPHIN) 1 g in sodium chloride 0.9 % 100 mL IVPB (1 g Intravenous New Bag/Given 03/16/23 2259)  azithromycin (ZITHROMAX) 500 mg in dextrose 5 % 250 mL IVPB (has no administration in time range)    ED Course/ Medical Decision Making/ A&P                                  Medical Decision Making Amount and/or Complexity of Data Reviewed Labs: ordered. Radiology: ordered.  Risk Decision regarding hospitalization.    Jeff Wilson is a 82 y.o. male with a past medical history significant for hypertension, previous stroke, previous TIA, hyperlipidemia, and dementia who presents for altered mental status.  According to EMS report, patient was seen in the day for similar altered mental status and was able to go back to his facility.  He returns today because he is normally ambulatory and verbal at baseline but today was sitting and staring off and not responding to the people.  He will answer some questions and tells me he is not any pain.  He does report feeling tired.  He is denying any headache or neck pain.  He will not answer all questions and is warm to the touch.  Will get a rectal temp.  He is not describing any cough, urinary changes, constipation, or diarrhea.    On exam, lungs had some slight coarseness.  Chest was nontender.  Abdomen nontender.  Patient was moving extremities.  No rash seen on initial exam.  Patient warm to the touch, will get rectal temp.  Patient is tachycardic and tachypneic on my exam.  If he is febrile, anticipate determination of sepsis due to altered mental status will likely give broad-spectrum antibiotics and admit.  Given his lack of any headache or neck pain or neck stiffness, low suspicion for meningitis at this time.  Will hold on LP.  Will look for UTI by urinalysis, will get chest x-ray, and will get other labs.  Will also do TSH.  Anticipate admission for recurrent altered mental status and minimally responsiveness.    10:52 PM Workup continues to return.  Urinalysis shows leukocytes and bacteria and his x-ray shows some opacities.  I asked the patient and he now says he has had some coughing and has had pneumonia before.  Although his white count is normal and his lactic acid is normal,  with 2 visits over the last few days for altered mental status and intermittently being minimally responsive, I am concerned about sending him back home.  Will order antibiotics and will call for admission for intermittent altered mental status in the setting of suspected UTI and pneumonia.  Patient will be admitted for further management.         Final Clinical Impression(s) / ED Diagnoses Final diagnoses:  Transient alteration of  awareness  Confusion  Urinary tract infection without hematuria, site unspecified  Pneumonia due to infectious organism, unspecified laterality, unspecified part of lung      Clinical Impression: 1. Transient alteration of awareness   2. Confusion   3. Urinary tract infection without hematuria, site unspecified   4. Pneumonia due to infectious organism, unspecified laterality, unspecified part of lung     Disposition: Admit  This note was prepared with assistance of Dragon voice recognition software. Occasional wrong-word or sound-a-like substitutions may have occurred due to the inherent limitations of voice recognition software.      Britany Callicott, Canary Brim, MD 03/16/23 [phone]

## 2023-03-16 NOTE — Assessment & Plan Note (Signed)
Continue aspirin and statin. 

## 2023-03-16 NOTE — Assessment & Plan Note (Signed)
Currently oriented only to self and place.  Patient unable to sit up on his in the bed on his own and has been documented past to mostly bed and wheelchair bound.

## 2023-03-16 NOTE — H&P (Signed)
History and Physical    PatientMarland Kitchen Laterrian Wilson RUE:[id number] DOB: 04-04-1941 DOA: 03/16/2023 DOS: the patient was seen and examined on 03/16/2023 PCP: [name], MD  Patient coming from:  [facility]  Chief Complaint:  Chief Complaint  Patient presents with   Altered Mental Status   HPI: Jeff Wilson is a 82 y.o. male with medical history significant of Hypertension, hyperlipidemia, TSA and dementia who presents with altered mental status.  Patient was just evaluated in the ED 2 days ago with altered mental status and less able to perform his ADLs.  UA chest x-ray and CTA head or negative.  He was given IV fluids and discharged back to the facility.  He presents back to the ED today with concerns of staring off and not being able to answer questions from staff.  Reportedly baseline is ambulatory and verbal although has been documented in the past to mostly be bed and wheelchair bound. He has also been evaluated in the past for episodes of being nonverbal.   Of note, he was last hospitalized from 02/19/2023 - 02/22/2023 with acute metabolic encephalopathy secondary to urosepsis.  Pt denies any headache, vision changes, cough, runny nose, headache, nausea, vomiting or diarrhea.  Although reports for the past several days he has been having accidents in the bed.  Patient oriented only to self and place so unclear on the validity of his history.  On arrival to the ED, he was afebrile heart rate in the 80s to 90s, normotensive on room air.  CBC without leukocytosis and no upward trend from recent prior.  Hemoglobin 12.7.  Lactate within normal limits.  CMP with hypokalemia 3.2 and otherwise unremarkable.  UA with trace leukocyte, negative nitrate and rare bacteria.  Chest x-ray on my review with low volume but mild basilar opacity.  He was started on IV Rocephin and azithromycin for presumed pneumonia.  Hospitalist then consulted for admission.   Review of Systems: As  mentioned in the history of present illness. All other systems reviewed and are negative. Past Medical History:  Diagnosis Date   Dementia (HCC)    Hyperlipemia    TIA (transient ischemic attack)    History reviewed. No pertinent surgical history. Social History:  reports that he has quit smoking. He has never used smokeless tobacco. No history on file for alcohol use and drug use.  No Known Allergies  Family History  Problem Relation Age of Onset   Heart disease Neg Hx     Prior to Admission medications   Medication Sig Start Date End Date Taking? Authorizing Provider  amLODipine (NORVASC) 10 MG tablet Take 10 mg by mouth daily.   Yes [provider]  atorvastatin (LIPITOR) 40 MG tablet Take 40 mg by mouth daily. 02/18/23  Yes [provider]  calcium carbonate (TUMS - DOSED IN MG ELEMENTAL CALCIUM) 500 MG chewable tablet Chew 1 tablet by mouth daily.   Yes [provider]  clopidogrel (PLAVIX) 75 MG tablet Take 75 mg by mouth daily.   Yes [provider]  ketoconazole (NIZORAL) 2 % shampoo Apply 1 Application topically. On Mondays every 2 weeks   Yes [provider]  Multiple Vitamin (MULTIVITAMIN WITH MINERALS) TABS tablet Take 1 tablet by mouth daily. 12/18/21  Yes Standley Brooking, MD  polyethylene glycol (MIRALAX / GLYCOLAX) 17 g packet Take 17 g by mouth daily.   Yes [provider]  sennosides-docusate sodium (SENOKOT-S) 8.6-50 MG tablet Take 2 tablets by mouth daily.  Yes [provider]  Vitamin D, Ergocalciferol, (DRISDOL) 1.25 MG (50000 UNIT) CAPS capsule Take 50,000 Units by mouth every Monday.   Yes [provider]    Physical Exam: Vitals:   03/16/23 2145 03/16/23 2200 03/16/23 2226 03/16/23 2245  BP: 125/82 111/84 126/82 111/81  Pulse: 79 82 82 86  Resp: 18  16 13   Temp:      TempSrc:      SpO2: 98% 99% 98% 99%  Weight:      Height:       Constitutional: NAD, calm, comfortable,  chronically ill-appearing elderly male lying in bed Eyes: lids and conjunctivae normal ENMT: Mucous membranes are moist.  Neck: normal, supple Respiratory: clear to auscultation bilaterally, no wheezing, no crackles. Normal respiratory effort. No accessory muscle use.  Cardiovascular: Regular rate and rhythm, no murmurs / rubs / gallops. No extremity edema.  Abdomen: no tenderness, soft, nondistended  musculoskeletal: no clubbing / cyanosis. No joint deformity upper and lower extremities. Good ROM, no contractures. Normal muscle tone.  Skin: no rashes, lesions, ulcers. No induration Neurologic: CN 2-12 grossly intact.  No facial asymmetry.  No dysarthria or aphasia.  Able to lift bilateral upper extremity against gravity but not resistance.  Able to move bilateral lower extremity in bed but not against gravity. Psychiatric: Alert and oriented only to self and place but not time. Data Reviewed:  See HPI  Assessment and Plan: * AMS (altered mental status) - Patient reportedly sent from skilled nursing facility for decreased responsiveness and being less verbal.  Patient is verbal here in the ED without any dysarthria and no focal neurological deficits.  Questionable UTI so we will continue IV Rocephin pending urine culture.  Chest x-ray on my review with low volume and minimal opacities so not convincing for pneumonia.  He also mentions recent incontinent and has had episodes of staring off and being nonverbal in the past.  Will obtain EEG. -TSH is within normal limits  Hypokalemia - Give oral potassium replacement  Essential hypertension - Continue amlodipine  Mixed hyperlipidemia - Continue statin  Dementia without behavioral disturbance (HCC) Currently oriented only to self and place.  Patient unable to sit up on his in the bed on his own and has been documented past to mostly bed and wheelchair bound.  History of CVA (cerebrovascular accident) - Continue aspirin and  statin      Advance Care Planning: Full- presumed  Consults: None  Family Communication: None at bedside  Severity of Illness: The appropriate patient status for this patient is OBSERVATION. Observation status is judged to be reasonable and necessary in order to provide the required intensity of service to ensure the patient's safety. The patient's presenting symptoms, physical exam findings, and initial radiographic and laboratory data in the context of their medical condition is felt to place them at decreased risk for further clinical deterioration. Furthermore, it is anticipated that the patient will be medically stable for discharge from the hospital within 2 midnights of admission.   Author: Anselm Jungling, DO 03/16/2023 11:48 PM  For on call review [email or web address].

## 2023-03-16 NOTE — ED Triage Notes (Signed)
Pt BIB [facility] from [facility] for AMS. Facility noticed the pt was staring off and not answering any of their questions.  Pt is ambulatory and verbal at baseline. Hx of dementia. Was seen in ED last week for the same issues. EMS reports the pt was only able to verbalize that he was in no pain.  EMS VS 139/78 BP 94 P 98% RA O2 165 CBG

## 2023-03-16 NOTE — Assessment & Plan Note (Signed)
Continue statin. 

## 2023-03-16 NOTE — Assessment & Plan Note (Addendum)
-   Patient reportedly sent from skilled nursing facility for decreased responsiveness and being less verbal.  Patient is verbal here in the ED without any dysarthria and no focal neurological deficits.  Questionable UTI so we will continue IV Rocephin pending urine culture.  Chest x-ray on my review with low volume and minimal opacities so not convincing for pneumonia.  He also mentions recent incontinent and has had episodes of staring off and being nonverbal in the past.  Will obtain EEG. -TSH is within normal limits

## 2023-03-17 ENCOUNTER — Observation Stay (HOSPITAL_COMMUNITY): Payer: Medicare HMO

## 2023-03-17 DIAGNOSIS — R410 Disorientation, unspecified: Secondary | ICD-10-CM | POA: Diagnosis not present

## 2023-03-17 DIAGNOSIS — R569 Unspecified convulsions: Secondary | ICD-10-CM

## 2023-03-17 DIAGNOSIS — R4182 Altered mental status, unspecified: Secondary | ICD-10-CM | POA: Diagnosis not present

## 2023-03-17 DIAGNOSIS — N390 Urinary tract infection, site not specified: Secondary | ICD-10-CM

## 2023-03-17 DIAGNOSIS — J189 Pneumonia, unspecified organism: Secondary | ICD-10-CM

## 2023-03-17 DIAGNOSIS — R404 Transient alteration of awareness: Secondary | ICD-10-CM | POA: Diagnosis not present

## 2023-03-17 LAB — BASIC METABOLIC PANEL
Anion gap: 6 (ref 5–15)
BUN: 11 mg/dL (ref 8–23)
CO2: 24 mmol/L (ref 22–32)
Calcium: 8.4 mg/dL — ABNORMAL LOW (ref 8.9–10.3)
Chloride: 107 mmol/L (ref 98–111)
Creatinine, Ser: 0.95 mg/dL (ref 0.61–1.24)
GFR, Estimated: >60 mL/min (ref >60)
Glucose, Bld: 102 mg/dL — ABNORMAL HIGH (ref 70–99)
Potassium: 3.8 mmol/L (ref 3.5–5.1)
Sodium: 137 mmol/L (ref 135–145)

## 2023-03-17 LAB — URINE CULTURE: Culture: NO GROWTH

## 2023-03-17 LAB — CBC
HCT: 30.8 % — ABNORMAL LOW (ref 39.0–52.0)
Hemoglobin: 10 g/dL — ABNORMAL LOW (ref 13.0–17.0)
MCH: 27.4 pg (ref 26.0–34.0)
MCHC: 32.5 g/dL (ref 30.0–36.0)
MCV: 84.4 fL (ref 80.0–100.0)
Platelets: 175 10*3/uL (ref 150–400)
RBC: 3.65 MIL/uL — ABNORMAL LOW (ref 4.22–5.81)
RDW: 14.6 % (ref 11.5–15.5)
WBC: 6.1 10*3/uL (ref 4.0–10.5)
nRBC: 0 % (ref 0.0–0.2)

## 2023-03-17 NOTE — Plan of Care (Signed)

## 2023-03-17 NOTE — ED Notes (Signed)
ED TO INPATIENT HANDOFF REPORT  ED Nurse Name and Phone #: Tobi Bastos [phone]  S Name/Age/Gender Jeff Wilson 82 y.o. male Room/Bed: 032C/032C  Code Status   Code Status: Full Code  Home/SNF/Other Home Patient oriented to: self, place, time, and situation Is this baseline? Yes   Triage Complete: Triage complete  Chief Complaint AMS (altered mental status) [R41.82]  Triage Note Pt BIB [facility] from [facility] for AMS. Facility noticed the pt was staring off and not answering any of their questions.  Pt is ambulatory and verbal at baseline. Hx of dementia. Was seen in ED last week for the same issues. EMS reports the pt was only able to verbalize that he was in no pain.  EMS VS 139/78 BP 94 P 98% RA O2 165 CBG   Allergies No Known Allergies  Level of Care/Admitting Diagnosis ED Disposition     ED Disposition  Admit   Condition  --   Comment  Hospital Area: [facility] [[id number]]  Level of Care: Telemetry Medical [104]  May place patient in observation at [facility] or [facility] if equivalent level of care is available:: No  Covid Evaluation: Asymptomatic - no recent exposure (last 10 days) testing not required  Diagnosis: AMS (altered mental status) [1610960]  Admitting Physician: Anselm Jungling [[id number]]  Attending Physician: Anselm Jungling [[id number]]          B Medical/Surgery History Past Medical History:  Diagnosis Date   Dementia (HCC)    Hyperlipemia    TIA (transient ischemic attack)    History reviewed. No pertinent surgical history.   A IV Location/Drains/Wounds Patient Lines/Drains/Airways Status     Active Line/Drains/Airways     Name Placement date Placement time Site Days   Peripheral IV 03/16/23 1" Anterior;Right Forearm 03/16/23  1501  Forearm  1            Intake/Output Last 24 hours No intake or output data in the 24 hours ending 03/17/23 0036  Labs/Imaging Results for orders placed or performed during the  hospital encounter of 03/16/23 (from the past 48 hour(s))  CBC with Differential     Status: Abnormal   Collection Time: 03/16/23  3:11 PM  Result Value Ref Range   WBC 5.5 4.0 - 10.5 K/uL   RBC 4.57 4.22 - 5.81 MIL/uL   Hemoglobin 12.7 (L) 13.0 - 17.0 g/dL   HCT 95.6 (L) 21.3 - 08.6 %   MCV 85.1 80.0 - 100.0 fL   MCH 27.8 26.0 - 34.0 pg   MCHC 32.6 30.0 - 36.0 g/dL   RDW 57.8 46.9 - 62.9 %   Platelets 207 150 - 400 K/uL   nRBC 0.0 0.0 - 0.2 %   Neutrophils Relative % 69 %   Neutro Abs 3.8 1.7 - 7.7 K/uL   Lymphocytes Relative 22 %   Lymphs Abs 1.2 0.7 - 4.0 K/uL   Monocytes Relative 7 %   Monocytes Absolute 0.4 0.1 - 1.0 K/uL   Eosinophils Relative 1 %   Eosinophils Absolute 0.1 0.0 - 0.5 K/uL   Basophils Relative 1 %   Basophils Absolute 0.0 0.0 - 0.1 K/uL   Immature Granulocytes 0 %   Abs Immature Granulocytes 0.02 0.00 - 0.07 K/uL    Comment: Performed at [facility], [address], [place], [place] [address]  Comprehensive metabolic panel     Status: Abnormal   Collection Time: 03/16/23  3:11 PM  Result Value  Ref Range   Sodium 141 135 - 145 mmol/L   Potassium 3.2 (L) 3.5 - 5.1 mmol/L   Chloride 104 98 - 111 mmol/L   CO2 26 22 - 32 mmol/L   Glucose, Bld 143 (H) 70 - 99 mg/dL    Comment: Glucose reference range applies only to samples taken after fasting for at least 8 hours.   BUN 11 8 - 23 mg/dL   Creatinine, Ser 1.61 0.61 - 1.24 mg/dL   Calcium 9.4 8.9 - 09.6 mg/dL   Total Protein 6.7 6.5 - 8.1 g/dL   Albumin 3.1 (L) 3.5 - 5.0 g/dL   AST 23 15 - 41 U/L   ALT 15 0 - 44 U/L   Alkaline Phosphatase 120 38 - 126 U/L   Total Bilirubin 0.8 <1.2 mg/dL   GFR, Estimated >04 >54 mL/min    Comment: (NOTE) Calculated using the CKD-EPI Creatinine Equation (2021)    Anion gap 11 5 - 15    Comment: Performed at [facility], [address] [place], [place] [address]  TSH     Status: None   Collection Time: 03/16/23  3:16 PM  Result Value Ref Range   TSH  3.744 0.350 - 4.500 uIU/mL    Comment: Performed by a 3rd Generation assay with a functional sensitivity of <=0.01 uIU/mL. Performed at [facility], [address], [place], [place] [address]   Urinalysis, w/ Reflex to Culture (Infection Suspected) -Urine, Clean Catch     Status: Abnormal   Collection Time: 03/16/23  3:25 PM  Result Value Ref Range   Specimen Source URINE, CLEAN CATCH    Color, Urine YELLOW YELLOW   APPearance CLEAR CLEAR   Specific Gravity, Urine 1.020 1.005 - 1.030   pH 6.5 5.0 - 8.0   Glucose, UA NEGATIVE NEGATIVE mg/dL   Hgb urine dipstick MODERATE (A) NEGATIVE   Bilirubin Urine NEGATIVE NEGATIVE   Ketones, ur NEGATIVE NEGATIVE mg/dL   Protein, ur NEGATIVE NEGATIVE mg/dL   Nitrite NEGATIVE NEGATIVE   Leukocytes,Ua TRACE (A) NEGATIVE   Squamous Epithelial / HPF 0-5 0 - 5 /HPF   WBC, UA 0-5 0 - 5 WBC/hpf    Comment: Reflex urine culture not performed if WBC <=10, OR if Squamous epithelial cells >5. If Squamous epithelial cells >5, suggest recollection.   RBC / HPF >50 0 - 5 RBC/hpf   Bacteria, UA RARE (A) NONE SEEN   Mucus PRESENT     Comment: Performed at [facility], [address], [place], [place] [address]  I-Stat CG4 Lactic Acid     Status: None   Collection Time: 03/16/23  4:49 PM  Result Value Ref Range   Lactic Acid, Venous 1.0 0.5 - 1.9 mmol/L   CT HEAD WO CONTRAST ( )  Result Date: 03/16/2023 CLINICAL DATA:  Mental status change, persistent or worsening EXAM: CT HEAD WITHOUT CONTRAST TECHNIQUE: Contiguous axial images were obtained from the base of the skull through the vertex without intravenous contrast. RADIATION DOSE REDUCTION: This exam was performed according to the departmental dose-optimization program which includes automated exposure control, adjustment of the mA and/or kV according to patient size and/or use of iterative reconstruction technique. COMPARISON:  03/14/2023 FINDINGS: Brain: No evidence of acute infarction,  hemorrhage, hydrocephalus, extra-axial collection or mass lesion/mass effect. Patchy low-density changes within the periventricular and subcortical white matter most compatible with chronic microvascular ischemic change. Mild diffuse cerebral volume loss. Vascular: Atherosclerotic calcifications involving the large vessels  of the skull base. No unexpected hyperdense vessel. Skull: Normal. Negative for fracture or focal lesion. Sinuses/Orbits: No acute finding. Other: None. IMPRESSION: 1. No acute intracranial findings. 2. Chronic microvascular ischemic change and cerebral volume loss. Electronically Signed   By: Duanne Guess D.O.   On: 03/16/2023 17:00   DG Chest Portable 1 View  Result Date: 03/16/2023 CLINICAL DATA:  Altered mental status EXAM: PORTABLE CHEST 1 VIEW COMPARISON:  Chest radiograph dated 03/14/2023 FINDINGS: Mildly low lung volumes. Minimal bibasilar patchy opacities. No pleural effusion or pneumothorax. The heart size and mediastinal contours are within normal limits. No acute osseous abnormality. IMPRESSION: Mildly low lung volumes with minimal bibasilar patchy opacities, likely atelectasis. Electronically Signed   By: Agustin Cree M.D.   On: 03/16/2023 15:57    Pending Labs Unresulted Labs (From admission, onward)     Start     Ordered   03/17/23 0500  CBC  Tomorrow morning,   R        03/16/23 2340   03/17/23 0500  Basic metabolic panel  Tomorrow morning,   R        03/16/23 2340   03/16/23 1516  Blood culture (routine x 2)  BLOOD CULTURE X 2,   R (with STAT occurrences)      03/16/23 1516   03/16/23 1516  Urine Culture  Once,   URGENT       Question:  Indication  Answer:  Altered mental status (if no other cause identified)   03/16/23 1516            Vitals/Pain Today's Vitals   03/16/23 2200 03/16/23 2226 03/16/23 2245 03/16/23 2352  BP: 111/84 126/82 111/81 119/77  Pulse: 82 82 86 79  Resp:  16 13 (!) 21  Temp:    98.4 F (36.9 C)  TempSrc:    Oral  SpO2:  99% 98% 99% 99%  Weight:      Height:      PainSc:    5     Isolation Precautions No active isolations  Medications Medications  azithromycin (ZITHROMAX) 500 mg in dextrose 5 % 250 mL IVPB (500 mg Intravenous New Bag/Given 03/16/23 2357)  enoxaparin (LOVENOX) injection 40 mg (has no administration in time range)  cefTRIAXone (ROCEPHIN) 1 g in sodium chloride 0.9 % 100 mL IVPB (has no administration in time range)  amLODipine (NORVASC) tablet 10 mg (has no administration in time range)  atorvastatin (LIPITOR) tablet 40 mg (has no administration in time range)  senna-docusate (Senokot-S) tablet 2 tablet (has no administration in time range)  polyethylene glycol (MIRALAX / GLYCOLAX) packet 17 g (has no administration in time range)  clopidogrel (PLAVIX) tablet 75 mg (has no administration in time range)  cefTRIAXone (ROCEPHIN) 1 g in sodium chloride 0.9 % 100 mL IVPB (0 g Intravenous Stopped 03/16/23 2353)  potassium chloride SA (KLOR-CON M) CR tablet 40 mEq (40 mEq Oral Given 03/16/23 2353)    Mobility walks with device     Focused Assessments Neuro Assessment Handoff:  Swallow screen pass?  yes         Neuro Assessment: Within Defined Limits Neuro Checks:      Has TPA been given? No If patient is a Neuro Trauma and patient is going to OR before floor call report to 4N Charge nurse: [phone] or [phone]   R Recommendations: See Admitting Provider Note  Report given to:   Additional Notes:

## 2023-03-17 NOTE — Progress Notes (Signed)
EEG complete - results pending 

## 2023-03-17 NOTE — Progress Notes (Signed)
[facility] PROGRESS NOTE    Progress Note  Jeff Wilson  WJX:[id number] DOB: 06-04-1940 DOA: 03/16/2023 PCP: [name], MD     Brief Narrative:   Jeff Wilson is an 82 y.o. male past medical history significant for hypertension dementia recently discharged from the hospital on 02/22/2023 for acute metabolic encephalopathy due to infectious etiology/sepsis, comes in for acute metabolic encephalopathy.  Patient was recently seen in the ED 2 days prior to admission as which she was not able to perform her ADLs, during that time UA, chest x-ray and CT were unremarkable was given IV fluids and sent back to facility comes back in the day of admission as she was not able to respond back to the staff.,  At baseline she is able to carry on a conversation mostly bed and wheelchair bound but has been nonverbal   Assessment/Plan:   Acute metabolic encephalopathy: Per nursing staff at facility nonverbal.  Of unclear etiology. Concerned about a urinary infection on admission was started empirically on antibiotics urine cultures have been sent. Chest x-ray showed no acute findings except for bilateral atelectasis Tmax 99 with no leukocytosis. As she had a incontinent episode EEG was ordered. TSH is unremarkable.  Hypokalemia: Replete orally recheck in the morning.  Essential hypertension: Hold Norvasc blood pressure is improving.  Hyperlipidemia: Continue statins.  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    DVT prophylaxis: lovenox Family Communication:none Status is: Observation The patient remains OBS appropriate and will d/c before 2 midnights.    Code Status:     Code Status Orders  (From admission, onward)           Start     Ordered   03/16/23 2340  Full code  Continuous       Question:  By:  Answer:  Default: patient does not have capacity for decision making, no surrogate or prior directive available    03/16/23 2340           Code Status History     Date Active Date Inactive Code Status Order ID Comments User Context   02/19/2023 1635 02/22/2023 2014 Full Code [id number]  Maretta Bees, MD ED   12/11/2021 [id number] 12/17/2021 1829 Full Code [id number]  Shalhoub, Deno Lunger, MD ED      Advance Directive Documentation    Flowsheet Row Most Recent Value  Type of Advance Directive Out of facility DNR (pink MOST or yellow form)  Pre-existing out of facility DNR order (yellow form or pink MOST form) --  "MOST" Form in Place? --         IV Access:   Peripheral IV   Procedures and diagnostic studies:   CT HEAD WO CONTRAST ( )  Result Date: 03/16/2023 CLINICAL DATA:  Mental status change, persistent or worsening EXAM: CT HEAD WITHOUT CONTRAST TECHNIQUE: Contiguous axial images were obtained from the base of the skull through the vertex without intravenous contrast. RADIATION DOSE REDUCTION: This exam was performed according to the departmental dose-optimization program which includes automated exposure control, adjustment of the mA and/or kV according to patient size and/or use of iterative reconstruction technique. COMPARISON:  03/14/2023 FINDINGS: Brain: No evidence of acute infarction, hemorrhage, hydrocephalus, extra-axial collection or mass lesion/mass effect. Patchy low-density changes within the periventricular and subcortical white matter most compatible with chronic microvascular ischemic change. Mild diffuse cerebral volume loss. Vascular: Atherosclerotic calcifications involving the large vessels of the skull base. No unexpected hyperdense vessel. Skull: Normal. Negative  for fracture or focal lesion. Sinuses/Orbits: No acute finding. Other: None. IMPRESSION: 1. No acute intracranial findings. 2. Chronic microvascular ischemic change and cerebral volume loss. Electronically Signed   By: Duanne Guess D.O.   On: 03/16/2023 17:00   DG Chest Portable 1 View  Result Date:  03/16/2023 CLINICAL DATA:  Altered mental status EXAM: PORTABLE CHEST 1 VIEW COMPARISON:  Chest radiograph dated 03/14/2023 FINDINGS: Mildly low lung volumes. Minimal bibasilar patchy opacities. No pleural effusion or pneumothorax. The heart size and mediastinal contours are within normal limits. No acute osseous abnormality. IMPRESSION: Mildly low lung volumes with minimal bibasilar patchy opacities, likely atelectasis. Electronically Signed   By: Agustin Cree M.D.   On: 03/16/2023 15:57     Medical Consultants:   None.   Subjective:    Jeff Wilson nonverbal  Objective:    Vitals:   03/17/23 0121 03/17/23 0400 03/17/23 0800 03/17/23 0856  BP: 113/65 101/65 (!) 82/58 92/65  Pulse: 68 67 62 65  Resp: 18 15 17    Temp: 97.9 F (36.6 C) 98.1 F (36.7 C) 97.7 F (36.5 C)   TempSrc: Oral Oral Oral   SpO2: 100% 100% 99%   Weight:      Height:       SpO2: 99 %   Intake/Output Summary (Last 24 hours) at 03/17/2023 0944 Last data filed at 03/17/2023 0400 Gross per 24 hour  Intake 250 ml  Output --  Net 250 ml   Filed Weights   03/16/23 1608  Weight: 70 kg    Exam: General exam: In no acute distress. Respiratory system: Good air movement and clear to auscultation. Cardiovascular system: S1 & S2 heard, RRR. No JVD. Gastrointestinal system: Abdomen is nondistended, soft and nontender.  Extremities: No pedal edema. Skin: No rashes, lesions or ulcers Psychiatry: No judgment or insight in medical condition.   Data Reviewed:    Labs: Basic Metabolic Panel: Recent Labs  Lab 03/14/23 2030 03/16/23 1511 03/17/23 0540  NA 142 141 137  K 3.1* 3.2* 3.8  CL 105 104 107  CO2 29 26 24   GLUCOSE 101* 143* 102*  BUN 15 11 11   CREATININE 0.77 0.96 0.95  CALCIUM 9.2 9.4 8.4*   GFR Estimated Creatinine Clearance: 61.4 mL/min (by C-G formula based on SCr of 0.95 mg/dL). Liver Function Tests: Recent Labs  Lab 03/14/23 2030 03/16/23 1511  AST 21 23  ALT 16 15  ALKPHOS  119 120  BILITOT 0.7 0.8  PROT 7.6 6.7  ALBUMIN 3.6 3.1*   No results for input(s): "LIPASE", "AMYLASE" in the last 168 hours. Recent Labs  Lab 03/14/23 2030  AMMONIA 11   Coagulation profile No results for input(s): "INR", "PROTIME" in the last 168 hours. COVID-19 Labs  No results for input(s): "DDIMER", "FERRITIN", "LDH", "CRP" in the last 72 hours.  Lab Results  Component Value Date   SARSCOV2NAA NEGATIVE 02/19/2023   SARSCOV2NAA NEGATIVE 05/22/2020    CBC: Recent Labs  Lab 03/14/23 2030 03/16/23 1511 03/17/23 0540  WBC 7.5 5.5 6.1  NEUTROABS 5.5 3.8  --   HGB 12.0* 12.7* 10.0*  HCT 36.9* 38.9* 30.8*  MCV 87.6 85.1 84.4  PLT 195 207 175   Cardiac Enzymes: No results for input(s): "CKTOTAL", "CKMB", "CKMBINDEX", "TROPONINI" in the last 168 hours. BNP (last 3 results) No results for input(s): "PROBNP" in the last 8760 hours. CBG: Recent Labs  Lab 03/14/23 2011  GLUCAP 99   D-Dimer: No results for input(s): "DDIMER" in the last  72 hours. Hgb A1c: No results for input(s): "HGBA1C" in the last 72 hours. Lipid Profile: No results for input(s): "CHOL", "HDL", "LDLCALC", "TRIG", "CHOLHDL", "LDLDIRECT" in the last 72 hours. Thyroid function studies: Recent Labs    03/16/23 1516  TSH 3.744   Anemia work up: No results for input(s): "VITAMINB12", "FOLATE", "FERRITIN", "TIBC", "IRON", "RETICCTPCT" in the last 72 hours. Sepsis Labs: Recent Labs  Lab 03/14/23 2030 03/14/23 2038 03/16/23 1511 03/16/23 1649 03/17/23 0540  WBC 7.5  --  5.5  --  6.1  LATICACIDVEN  --  1.3  --  1.0  --    Microbiology Recent Results (from the past 240 hour(s))  Culture, blood (Routine X 2) w Reflex to ID Panel     Status: None (Preliminary result)   Collection Time: 03/14/23  8:30 PM   Specimen: Left Antecubital; Blood  Result Value Ref Range Status   Specimen Description   Final    LEFT ANTECUBITAL Performed at [facility], [address]  [place], [place] [address]    Special Requests   Final    BOTTLES DRAWN AEROBIC AND ANAEROBIC Blood Culture adequate volume Performed at [facility], [address] [place], [place] [address]    Culture   Final    NO GROWTH 2 DAYS Performed at [facility], [address] [place], [place] [address]    Report Status PENDING  Incomplete  Culture, blood (Routine X 2) w Reflex to ID Panel     Status: None (Preliminary result)   Collection Time: 03/14/23  8:30 PM   Specimen: Site Not Specified; Blood  Result Value Ref Range Status   Specimen Description   Final    SITE NOT SPECIFIED Performed at [facility], [address], [place], [place] [address]    Special Requests   Final    BOTTLES DRAWN AEROBIC AND ANAEROBIC Blood Culture adequate volume Performed at [facility], [address] [place], [place] [address]    Culture   Final    NO GROWTH 2 DAYS Performed at [facility], [address], [place], [place] [address]    Report Status PENDING  Incomplete  Blood culture (routine x 2)     Status: None (Preliminary result)   Collection Time: 03/16/23  3:16 PM   Specimen: BLOOD RIGHT HAND  Result Value Ref Range Status   Specimen Description BLOOD RIGHT HAND  Final   Special Requests   Final    BOTTLES DRAWN AEROBIC AND ANAEROBIC Blood Culture adequate volume   Culture   Final    NO GROWTH < 24 HOURS Performed at [facility], [address] [place], [place] [address]    Report Status PENDING  Incomplete  Blood culture (routine x 2)     Status: None (Preliminary result)   Collection Time: 03/16/23  3:21 PM   Specimen: BLOOD RIGHT HAND  Result Value Ref Range Status   Specimen Description BLOOD RIGHT HAND  Final   Special Requests   Final    BOTTLES DRAWN AEROBIC ONLY Blood Culture adequate volume   Culture   Final    NO GROWTH < 24 HOURS Performed at [facility], [address],  [place], [place] [address]    Report Status PENDING  Incomplete     Medications:    amLODipine  10 mg Oral Daily   atorvastatin  40 mg Oral Daily   clopidogrel  75 mg Oral Daily   enoxaparin (LOVENOX) injection  40 mg Subcutaneous Daily   polyethylene glycol  17 g Oral Daily   senna-docusate  2 tablet Oral Daily   Continuous Infusions:  cefTRIAXone (ROCEPHIN)  IV        LOS: 0 days   Marinda Elk  [facility]  03/17/2023, 9:44 AM

## 2023-03-17 NOTE — Procedures (Signed)
Patient Name: Jeff Wilson  MRN: [id number]  Epilepsy Attending: Charlsie Quest  Referring Physician/Provider: Anselm Jungling, DO  Date: 03/17/2023  Duration: 26.46 mins  Patient history: 82yo M with ams getting eeg to evaluate for seizure  Level of alertness: Awake, asleep  AEDs during EEG study: None  Technical aspects: This EEG study was done with scalp electrodes positioned according to the 10-20 International system of electrode placement. Electrical activity was reviewed with band pass filter of 1-70Hz , sensitivity of 7 uV/mm, display speed of 25mm/sec with a 60Hz  notched filter applied as appropriate. EEG data were recorded continuously and digitally stored.  Video monitoring was available and reviewed as appropriate.  Description: The posterior dominant rhythm consists of 8-9 Hz activity of moderate voltage (25-35 uV) seen predominantly in posterior head regions, symmetric and reactive to eye opening and eye closing. Sleep was characterized by vertex waves, sleep spindles (12 to 14 Hz), maximal frontocentral region. Hyperventilation and photic stimulation were not performed.     IMPRESSION: This study is within normal limits. No seizures or epileptiform discharges were seen throughout the recording.  A normal interictal EEG does not exclude the diagnosis of epilepsy.  Katriona Schmierer Annabelle Harman

## 2023-03-18 DIAGNOSIS — N390 Urinary tract infection, site not specified: Secondary | ICD-10-CM | POA: Diagnosis not present

## 2023-03-18 DIAGNOSIS — R4182 Altered mental status, unspecified: Secondary | ICD-10-CM | POA: Diagnosis not present

## 2023-03-18 DIAGNOSIS — J189 Pneumonia, unspecified organism: Secondary | ICD-10-CM | POA: Diagnosis not present

## 2023-03-18 DIAGNOSIS — R404 Transient alteration of awareness: Secondary | ICD-10-CM | POA: Diagnosis not present

## 2023-03-18 NOTE — Progress Notes (Signed)
[facility] PROGRESS NOTE    Progress Note  Jeff Wilson  WGN:[id number] DOB: [birth year] DOA: 03/16/2023 PCP: [name], MD     Brief Narrative:   Jeff Wilson is an 82 y.o. male past medical history significant for hypertension dementia recently discharged from the hospital on 02/22/2023 for acute metabolic encephalopathy due to infectious etiology/sepsis, comes in for acute metabolic encephalopathy.  Patient was recently seen in the ED 2 days prior to admission as which she was not able to perform her ADLs, during that time UA, chest x-ray and CT were unremarkable was given IV fluids and sent back to facility comes back in the day of admission as she was not able to respond back to the staff.,  At baseline she is able to carry on a conversation mostly bed and wheelchair bound but has been nonverbal.  Assessment/Plan:   Acute metabolic encephalopathy: Per nursing staff at facility nonverbal.  Infectious workup has remained negative till date. Has remained afebrile with no leukocytosis. Continue empiric antibiotics. Chest x-ray showed no acute findings except for bilateral atelectasis EEG showed no evidence of seizures. TSH is unremarkable. Discontinue telemetry. Consult PT OT  Hypokalemia: Replete orally recheck in the morning.  Essential hypertension: Hold Norvasc, blood pressure is improving.  Hyperlipidemia: Continue statins.  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 Haldol IV as needed high risk of aspiration.  History of CVA: Continue aspirin and statins.    DVT prophylaxis: lovenox Family Communication:none Status is: Observation The patient remains OBS appropriate and will d/c before 2 midnights.    Code Status:     Code Status Orders  (From admission, onward)           Start     Ordered   03/16/23 2340  Full code  Continuous       Question:  By:  Answer:  Default: patient does not have capacity for decision making, no  surrogate or prior directive available   03/16/23 2340           Code Status History     Date Active Date Inactive Code Status Order ID Comments User Context   02/19/2023 1635 02/22/2023 2014 Full Code [id number]  Maretta Bees, MD ED   12/11/2021 [phone] 12/17/2021 1829 Full Code [id number]  Shalhoub, Deno Lunger, MD ED      Advance Directive Documentation    Flowsheet Row Most Recent Value  Type of Advance Directive Out of facility DNR (pink MOST or yellow form)  Pre-existing out of facility DNR order (yellow form or pink MOST form) --  "MOST" Form in Place? --         IV Access:   Peripheral IV   Procedures and diagnostic studies:   EEG adult  Result Date: 03/17/2023 Charlsie Quest, MD     03/17/2023  1:24 PM Patient Name: Jeff Wilson MRN: [id number] Epilepsy Attending: Charlsie Quest Referring Physician/Provider: Anselm Jungling, DO Date: 03/17/2023 Duration: 26.46 mins Patient history: 82yo M with ams getting eeg to evaluate for seizure Level of alertness: Awake, asleep AEDs during EEG study: None Technical aspects: This EEG study was done with scalp electrodes positioned according to the 10-20 International system of electrode placement. Electrical activity was reviewed with band pass filter of 1-70Hz , sensitivity of 7 uV/mm, display speed of 62mm/sec with a 60Hz  notched filter applied as appropriate. EEG data were recorded continuously and digitally stored.  Video monitoring was available and reviewed as appropriate. Description: The posterior dominant rhythm consists of  8-9 Hz activity of moderate voltage (25-35 uV) seen predominantly in posterior head regions, symmetric and reactive to eye opening and eye closing. Sleep was characterized by vertex waves, sleep spindles (12 to 14 Hz), maximal frontocentral region. Hyperventilation and photic stimulation were not performed.   IMPRESSION: This study is within normal limits. No seizures or epileptiform discharges were seen  throughout the recording. A normal interictal EEG does not exclude the diagnosis of epilepsy. [name]   CT HEAD WO CONTRAST ( )  Result Date: 03/16/2023 CLINICAL DATA:  Mental status change, persistent or worsening EXAM: CT HEAD WITHOUT CONTRAST TECHNIQUE: Contiguous axial images were obtained from the base of the skull through the vertex without intravenous contrast. RADIATION DOSE REDUCTION: This exam was performed according to the departmental dose-optimization program which includes automated exposure control, adjustment of the mA and/or kV according to patient size and/or use of iterative reconstruction technique. COMPARISON:  03/14/2023 FINDINGS: Brain: No evidence of acute infarction, hemorrhage, hydrocephalus, extra-axial collection or mass lesion/mass effect. Patchy low-density changes within the periventricular and subcortical white matter most compatible with chronic microvascular ischemic change. Mild diffuse cerebral volume loss. Vascular: Atherosclerotic calcifications involving the large vessels of the skull base. No unexpected hyperdense vessel. Skull: Normal. Negative for fracture or focal lesion. Sinuses/Orbits: No acute finding. Other: None. IMPRESSION: 1. No acute intracranial findings. 2. Chronic microvascular ischemic change and cerebral volume loss. Electronically Signed   By: Duanne Guess D.O.   On: 03/16/2023 17:00   DG Chest Portable 1 View  Result Date: 03/16/2023 CLINICAL DATA:  Altered mental status EXAM: PORTABLE CHEST 1 VIEW COMPARISON:  Chest radiograph dated 03/14/2023 FINDINGS: Mildly low lung volumes. Minimal bibasilar patchy opacities. No pleural effusion or pneumothorax. The heart size and mediastinal contours are within normal limits. No acute osseous abnormality. IMPRESSION: Mildly low lung volumes with minimal bibasilar patchy opacities, likely atelectasis. Electronically Signed   By: Agustin Cree M.D.   On: 03/16/2023 15:57     Medical Consultants:    None.   Subjective:    Jeff Wilson no complaints today relates he is hungry.  Objective:    Vitals:   03/17/23 1640 03/17/23 2056 03/18/23 0420 03/18/23 0803  BP: 126/76 124/77 122/64 116/72  Pulse: 64 70 69 63  Resp: 18 17 18 18   Temp: 98.4 F (36.9 C) 97.9 F (36.6 C) 98 F (36.7 C) 98 F (36.7 C)  TempSrc: Oral Oral Oral   SpO2: 100% 100% 100% 97%  Weight:      Height:       SpO2: 97 %   Intake/Output Summary (Last 24 hours) at 03/18/2023 1041 Last data filed at 03/18/2023 0333 Gross per 24 hour  Intake 100 ml  Output 550 ml  Net -450 ml   Filed Weights   03/16/23 1608  Weight: 70 kg    Exam: General exam: In no acute distress. Respiratory system: Good air movement and clear to auscultation. Cardiovascular system: S1 & S2 heard, RRR. No JVD. Gastrointestinal system: Abdomen is nondistended, soft and nontender.  Extremities: No pedal edema. Skin: No rashes, lesions or ulcers Psychiatry: Judgement and insight appear normal. Mood & affect appropriate.  Data Reviewed:    Labs: Basic Metabolic Panel: Recent Labs  Lab 03/14/23 2030 03/16/23 1511 03/17/23 0540  NA 142 141 137  K 3.1* 3.2* 3.8  CL 105 104 107  CO2 29 26 24   GLUCOSE 101* 143* 102*  BUN 15 11 11   CREATININE 0.77 0.96 0.95  CALCIUM  9.2 9.4 8.4*   GFR Estimated Creatinine Clearance: 61.4 mL/min (by C-G formula based on SCr of 0.95 mg/dL). Liver Function Tests: Recent Labs  Lab 03/14/23 2030 03/16/23 1511  AST 21 23  ALT 16 15  ALKPHOS 119 120  BILITOT 0.7 0.8  PROT 7.6 6.7  ALBUMIN 3.6 3.1*   No results for input(s): "LIPASE", "AMYLASE" in the last 168 hours. Recent Labs  Lab 03/14/23 2030  AMMONIA 11   Coagulation profile No results for input(s): "INR", "PROTIME" in the last 168 hours. COVID-19 Labs  No results for input(s): "DDIMER", "FERRITIN", "LDH", "CRP" in the last 72 hours.  Lab Results  Component Value Date   SARSCOV2NAA NEGATIVE 02/19/2023    SARSCOV2NAA NEGATIVE 05/22/2020    CBC: Recent Labs  Lab 03/14/23 2030 03/16/23 1511 03/17/23 0540  WBC 7.5 5.5 6.1  NEUTROABS 5.5 3.8  --   HGB 12.0* 12.7* 10.0*  HCT 36.9* 38.9* 30.8*  MCV 87.6 85.1 84.4  PLT 195 207 175   Cardiac Enzymes: No results for input(s): "CKTOTAL", "CKMB", "CKMBINDEX", "TROPONINI" in the last 168 hours. BNP (last 3 results) No results for input(s): "PROBNP" in the last 8760 hours. CBG: Recent Labs  Lab 03/14/23 2011  GLUCAP 99   D-Dimer: No results for input(s): "DDIMER" in the last 72 hours. Hgb A1c: No results for input(s): "HGBA1C" in the last 72 hours. Lipid Profile: No results for input(s): "CHOL", "HDL", "LDLCALC", "TRIG", "CHOLHDL", "LDLDIRECT" in the last 72 hours. Thyroid function studies: Recent Labs    03/16/23 1516  TSH 3.744   Anemia work up: No results for input(s): "VITAMINB12", "FOLATE", "FERRITIN", "TIBC", "IRON", "RETICCTPCT" in the last 72 hours. Sepsis Labs: Recent Labs  Lab 03/14/23 2030 03/14/23 2038 03/16/23 1511 03/16/23 1649 03/17/23 0540  WBC 7.5  --  5.5  --  6.1  LATICACIDVEN  --  1.3  --  1.0  --    Microbiology Recent Results (from the past 240 hour(s))  Culture, blood (Routine X 2) w Reflex to ID Panel     Status: None (Preliminary result)   Collection Time: 03/14/23  8:30 PM   Specimen: Left Antecubital; Blood  Result Value Ref Range Status   Specimen Description   Final    LEFT ANTECUBITAL Performed at [facility], [address] [place], [place] [address]    Special Requests   Final    BOTTLES DRAWN AEROBIC AND ANAEROBIC Blood Culture adequate volume Performed at [facility], [address] [place], [place] [address]    Culture   Final    NO GROWTH 3 DAYS Performed at [facility], [address] [place], [place] [address]    Report Status PENDING  Incomplete  Culture, blood (Routine X 2) w Reflex to ID Panel     Status: None  (Preliminary result)   Collection Time: 03/14/23  8:30 PM   Specimen: Site Not Specified; Blood  Result Value Ref Range Status   Specimen Description   Final    SITE NOT SPECIFIED Performed at [facility], [address] [place], [place] [address]    Special Requests   Final    BOTTLES DRAWN AEROBIC AND ANAEROBIC Blood Culture adequate volume Performed at [facility], [address], [place], [place] [address]    Culture   Final    NO GROWTH 3 DAYS Performed at [facility], [address], [place], [place] [address]  Report Status PENDING  Incomplete  Blood culture (routine x 2)     Status: None (Preliminary result)   Collection Time: 03/16/23  3:16 PM   Specimen: BLOOD RIGHT HAND  Result Value Ref Range Status   Specimen Description BLOOD RIGHT HAND  Final   Special Requests   Final    BOTTLES DRAWN AEROBIC AND ANAEROBIC Blood Culture adequate volume   Culture   Final    NO GROWTH 2 DAYS Performed at [facility], [address], [place], [place] [address]    Report Status PENDING  Incomplete  Blood culture (routine x 2)     Status: None (Preliminary result)   Collection Time: 03/16/23  3:21 PM   Specimen: BLOOD RIGHT HAND  Result Value Ref Range Status   Specimen Description BLOOD RIGHT HAND  Final   Special Requests   Final    BOTTLES DRAWN AEROBIC ONLY Blood Culture adequate volume   Culture   Final    NO GROWTH 2 DAYS Performed at [facility], [address], [place], [place] [address]    Report Status PENDING  Incomplete  Urine Culture     Status: None   Collection Time: 03/16/23  3:25 PM   Specimen: Urine, Catheterized  Result Value Ref Range Status   Specimen Description URINE, CATHETERIZED  Final   Special Requests NONE  Final   Culture   Final    NO GROWTH Performed at [facility], [address] [place], [place] [address]    Report Status 03/17/2023 FINAL  Final     Medications:     atorvastatin  40 mg Oral Daily   clopidogrel  75 mg Oral Daily   enoxaparin (LOVENOX) injection  40 mg Subcutaneous Daily   polyethylene glycol  17 g Oral Daily   senna-docusate  2 tablet Oral Daily   Continuous Infusions:  cefTRIAXone (ROCEPHIN)  IV Stopped (03/17/23 2228)      LOS: 0 days   Marinda Elk  [facility]  03/18/2023, 10:41 AM

## 2023-03-18 NOTE — Plan of Care (Signed)

## 2023-03-18 NOTE — Evaluation (Signed)
Occupational Therapy Evaluation Patient Details Name: Jeff Wilson MRN: [id number] DOB: [birth year] Today's Date: 03/18/2023   History of Present Illness Jeff Wilson is a 82 yo male who presented from facility due to AMS. Admitted for PNA. PMHx: Hypertension, hyperlipidemia, TSA and dementia   Clinical Impression   Jeff Wilson was evaluated s/p the above admission list. He is from SNF with unknown at baseline, chart had conflicting information with pt being bedbound vs. ambulatory. Anticipate pt needs maximal assist for all aspects of care, and does not walk due to apparent inability to flex knees. Upon evaluation the pt was limited by baseline cognition, weakness and poor balance. Overall he needed max A for bed mobility and sitting balance initially. With prolonged sitting he was able to progress to CGA. Due to the deficits listed below the pt also needs max-total A for all aspects of ADLs. Pt will benefit from continued acute OT services and d/c back to SNF to resume current level of care.        If plan is discharge home, recommend the following: Two people to help with walking and/or transfers;Two people to help with bathing/dressing/bathroom;Assistance with cooking/housework;Assistance with feeding;Direct supervision/assist for medications management;Direct supervision/assist for financial management;Assist for transportation;Help with stairs or ramp for entrance    Functional Status Assessment  Patient has not had a recent decline in their functional status  Equipment Recommendations  Other (comment)       Precautions / Restrictions Precautions Precautions: Fall Restrictions Weight Bearing Restrictions: No      Mobility Bed Mobility Overal bed mobility: Needs Assistance Bed Mobility: Supine to Sit, Sit to Supine, Rolling Rolling: Max assist   Supine to sit: Max assist Sit to supine: Max assist        Transfers Overall transfer level: Needs assistance Equipment used: 1  person hand held assist Transfers: Sit to/from Stand Sit to Stand: Total assist           General transfer comment: pt requesting to stand, but has very poor knee flexion. he will need total A +2 or lift      Balance Overall balance assessment: Needs assistance Sitting-balance support: Feet supported Sitting balance-Leahy Scale: Poor Sitting balance - Comments: intially he needed total A for sitting balance due ot posterior bias, progressed to close CGA                                   ADL either performed or assessed with clinical judgement   ADL Overall ADL's : Needs assistance/impaired Eating/Feeding: Minimal assistance;Bed level   Grooming: Moderate assistance;Bed level   Upper Body Bathing: Moderate assistance;Bed level   Lower Body Bathing: Total assistance   Upper Body Dressing : Maximal assistance;Bed level   Lower Body Dressing: Total assistance   Toilet Transfer: Total assistance   Toileting- Clothing Manipulation and Hygiene: Total assistance       Functional mobility during ADLs: Maximal assistance General ADL Comments: ADLs supported upright in bed due to poor sitting balance, needs direct step by step cues     Vision Baseline Vision/History: 0 No visual deficits Vision Assessment?: No apparent visual deficits     Perception Perception: Not tested       Praxis Praxis: Not tested       Pertinent Vitals/Pain Pain Assessment Pain Assessment: No/denies pain Pain Intervention(s): Monitored during session     Extremity/Trunk Assessment Upper Extremity Assessment Upper Extremity Assessment: Generalized weakness  Lower Extremity Assessment Lower Extremity Assessment: Defer to PT evaluation   Cervical / Trunk Assessment Cervical / Trunk Assessment: Kyphotic   Communication Communication Communication: Difficulty following commands/understanding Following commands: Follows one step commands inconsistently   Cognition  Arousal: Alert Behavior During Therapy: Flat affect Overall Cognitive Status: History of cognitive impairments - at baseline                                 General Comments: Pt able to state his name, not his birthday. Pt states he lives around the corner and his mom lives behind him. Reports he is ambulatory at baseline (likely not true due to limited knee flexion). Pt did follow most simple commands but unable to carry over simple ADLs without step-by-step cues     General Comments  VSS     Home Living Family/patient expects to be discharged to:: Skilled nursing facility                Prior Functioning/Environment Prior Level of Function : Needs assist             Mobility Comments: unable to detail, pt states he is ambulatory however has bilateral knee contractor ADLs Comments: unable to detail due to dementia, likely maximal care for ADLs at facility        OT Problem List: Decreased strength         OT Goals(Current goals can be found in the care plan section) Acute Rehab OT Goals Patient Stated Goal: to stand OT Goal Formulation: With patient Time For Goal Achievement: 04/01/23 Potential to Achieve Goals: Fair   AM-PAC OT "6 Clicks" Daily Activity     Outcome Measure Help from another person eating meals?: A Little Help from another person taking care of personal grooming?: A Lot Help from another person toileting, which includes using toliet, bedpan, or urinal?: Total Help from another person bathing (including washing, rinsing, drying)?: A Lot Help from another person to put on and taking off regular upper body clothing?: A Lot Help from another person to put on and taking off regular lower body clothing?: Total 6 Click Score: 11   End of Session Equipment Utilized During Treatment: Gait belt Nurse Communication: Mobility status  Activity Tolerance: Patient tolerated treatment well Patient left: in bed;with call bell/phone within  reach;with bed alarm set  OT Visit Diagnosis: Muscle weakness (generalized) (M62.81);Other symptoms and signs involving cognitive function                Time: 4098-1191 OT Time Calculation (min): 23 min Charges:  OT General Charges $OT Visit: 1 Visit OT Evaluation $OT Eval Moderate Complexity: 1 Mod OT Treatments $Therapeutic Activity: 8-22 mins  Derenda Mis, OTR/L Acute Rehabilitation Services Office [phone] Secure Chat Communication Preferred   Donia Pounds 03/18/2023, 2:05 PM

## 2023-03-18 NOTE — Plan of Care (Signed)

## 2023-03-19 DIAGNOSIS — N390 Urinary tract infection, site not specified: Secondary | ICD-10-CM | POA: Diagnosis not present

## 2023-03-19 DIAGNOSIS — J189 Pneumonia, unspecified organism: Secondary | ICD-10-CM | POA: Diagnosis not present

## 2023-03-19 DIAGNOSIS — R4182 Altered mental status, unspecified: Secondary | ICD-10-CM | POA: Diagnosis not present

## 2023-03-19 DIAGNOSIS — R410 Disorientation, unspecified: Secondary | ICD-10-CM | POA: Diagnosis not present

## 2023-03-19 NOTE — Discharge Summary (Signed)
Physician Discharge Summary  Jeff Wilson ZOX:[id number] DOB: 09-Oct-1940 DOA: 03/16/2023  PCP: [name], MD  Admit date: 03/16/2023 Discharge date: 03/19/2023  Admitted From: SNF Disposition:  SNF  Recommendations for Outpatient Follow-up:  Follow up with PCP in 1-2 weeks Please obtain BMP/CBC in one week   Home Health:No Equipment/Devices:None  Discharge Condition:Stable CODE STATUS:Full Diet recommendation: Heart Healthy  Brief/Interim Summary: 82 y.o. male past medical history significant for hypertension dementia recently discharged from the hospital on 02/22/2023 for acute metabolic encephalopathy due to infectious etiology/sepsis, comes in for acute metabolic encephalopathy.  Patient was recently seen in the ED 2 days prior to admission as which she was not able to perform her ADLs, during that time UA, chest x-ray and CT were unremarkable was given IV fluids and sent back to facility comes back in the day of admission as she was not able to respond back to the staff.,  At baseline she is able to carry on a conversation mostly bed and wheelchair bound but has been nonverbal.   Discharge Diagnoses:  Principal Problem:   AMS (altered mental status) Active Problems:   Hypokalemia   Essential hypertension   Mixed hyperlipidemia   Dementia without behavioral disturbance (HCC)   History of CVA (cerebrovascular accident)  Acute metabolic encephalopathy: Infectious workup has remained negative, imaging showed no findings. EEG showed no evidence of seizures. TSH was unremarkable. Physical therapy evaluated the patient will need to go back to skilled nursing facility. Antihypertensive medication were discontinued. Of unclear etiology his encephalopathy question due to being overmedicated causing borderline low blood pressure.  Hypokalemia: Replete orally now resolved.  Essential hypertension: He will be discharged off Norvasc as his blood pressure is  stable.  Hyperlipidemia chronic and continue statins.  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  History of CVA: Continue aspirin and statins.  Discharge Instructions  Discharge Instructions     Diet - low sodium heart healthy   Complete by: As directed    Increase activity slowly   Complete by: As directed       Allergies as of 03/19/2023   No Known Allergies      Medication List     STOP taking these medications    amLODipine 10 MG tablet Commonly known as: NORVASC   ketoconazole 2 % shampoo Commonly known as: NIZORAL       TAKE these medications    atorvastatin 40 MG tablet Commonly known as: LIPITOR Take 40 mg by mouth daily.   calcium carbonate 500 MG chewable tablet Commonly known as: TUMS - dosed in mg elemental calcium Chew 1 tablet by mouth daily.   clopidogrel 75 MG tablet Commonly known as: PLAVIX Take 75 mg by mouth daily.   multivitamin with minerals Tabs tablet Take 1 tablet by mouth daily.   polyethylene glycol 17 g packet Commonly known as: MIRALAX / GLYCOLAX Take 17 g by mouth daily.   sennosides-docusate sodium 8.6-50 MG tablet Commonly known as: SENOKOT-S Take 2 tablets by mouth daily.   Vitamin D (Ergocalciferol) 1.25 MG (50000 UNIT) Caps capsule Commonly known as: DRISDOL Take 50,000 Units by mouth every Monday.        No Known Allergies  Consultations: None   Procedures/Studies: EEG adult  Result Date: 03/22/23 Charlsie Quest, MD     Mar 22, 2023  1:24 PM Patient Name: Jeff Wilson MRN: [id number] Epilepsy Attending: Charlsie Quest Referring Physician/Provider: Anselm Jungling, DO Date: March 22, 2023 Duration: 26.46 mins Patient history: 82yo M with ams getting  eeg to evaluate for seizure Level of alertness: Awake, asleep AEDs during EEG study: None Technical aspects: This EEG study was done with scalp electrodes positioned according to the 10-20 International system of electrode placement. Electrical  activity was reviewed with band pass filter of 1-70Hz , sensitivity of 7 uV/mm, display speed of 35mm/sec with a 60Hz  notched filter applied as appropriate. EEG data were recorded continuously and digitally stored.  Video monitoring was available and reviewed as appropriate. Description: The posterior dominant rhythm consists of 8-9 Hz activity of moderate voltage (25-35 uV) seen predominantly in posterior head regions, symmetric and reactive to eye opening and eye closing. Sleep was characterized by vertex waves, sleep spindles (12 to 14 Hz), maximal frontocentral region. Hyperventilation and photic stimulation were not performed.   IMPRESSION: This study is within normal limits. No seizures or epileptiform discharges were seen throughout the recording. A normal interictal EEG does not exclude the diagnosis of epilepsy. [name]   CT HEAD WO CONTRAST ( )  Result Date: 03/16/2023 CLINICAL DATA:  Mental status change, persistent or worsening EXAM: CT HEAD WITHOUT CONTRAST TECHNIQUE: Contiguous axial images were obtained from the base of the skull through the vertex without intravenous contrast. RADIATION DOSE REDUCTION: This exam was performed according to the departmental dose-optimization program which includes automated exposure control, adjustment of the mA and/or kV according to patient size and/or use of iterative reconstruction technique. COMPARISON:  03/14/2023 FINDINGS: Brain: No evidence of acute infarction, hemorrhage, hydrocephalus, extra-axial collection or mass lesion/mass effect. Patchy low-density changes within the periventricular and subcortical white matter most compatible with chronic microvascular ischemic change. Mild diffuse cerebral volume loss. Vascular: Atherosclerotic calcifications involving the large vessels of the skull base. No unexpected hyperdense vessel. Skull: Normal. Negative for fracture or focal lesion. Sinuses/Orbits: No acute finding. Other: None. IMPRESSION: 1.  No acute intracranial findings. 2. Chronic microvascular ischemic change and cerebral volume loss. Electronically Signed   By: Duanne Guess D.O.   On: 03/16/2023 17:00   DG Chest Portable 1 View  Result Date: 03/16/2023 CLINICAL DATA:  Altered mental status EXAM: PORTABLE CHEST 1 VIEW COMPARISON:  Chest radiograph dated 03/14/2023 FINDINGS: Mildly low lung volumes. Minimal bibasilar patchy opacities. No pleural effusion or pneumothorax. The heart size and mediastinal contours are within normal limits. No acute osseous abnormality. IMPRESSION: Mildly low lung volumes with minimal bibasilar patchy opacities, likely atelectasis. Electronically Signed   By: Agustin Cree M.D.   On: 03/16/2023 15:57   CT HEAD WO CONTRAST  Result Date: 03/14/2023 CLINICAL DATA:  Mental status change, persistent or worsening EXAM: CT HEAD WITHOUT CONTRAST TECHNIQUE: Contiguous axial images were obtained from the base of the skull through the vertex without intravenous contrast. RADIATION DOSE REDUCTION: This exam was performed according to the departmental dose-optimization program which includes automated exposure control, adjustment of the mA and/or kV according to patient size and/or use of iterative reconstruction technique. COMPARISON:  02/19/2023 FINDINGS: Brain: There is atrophy and chronic small vessel disease changes. No acute intracranial abnormality. Specifically, no hemorrhage, hydrocephalus, mass lesion, acute infarction, or significant intracranial injury. Vascular: No hyperdense vessel or unexpected calcification. Skull: No acute calvarial abnormality. Sinuses/Orbits: No acute findings Other: None IMPRESSION: Atrophy, chronic microvascular disease. No acute intracranial abnormality. Electronically Signed   By: Charlett Nose M.D.   On: 03/14/2023 21:29   DG Chest Port 1 View  Result Date: 03/14/2023 CLINICAL DATA:  Altered mental status EXAM: PORTABLE CHEST 1 VIEW COMPARISON:  None Available. FINDINGS: The heart  size  and mediastinal contours are within normal limits. Both lungs are clear. The visualized skeletal structures are unremarkable. IMPRESSION: No active disease. Electronically Signed   By: Charlett Nose M.D.   On: 03/14/2023 21:27   CT Head Wo Contrast  Result Date: 02/19/2023 CLINICAL DATA:  Altered mental status EXAM: CT HEAD WITHOUT CONTRAST TECHNIQUE: Contiguous axial images were obtained from the base of the skull through the vertex without intravenous contrast. RADIATION DOSE REDUCTION: This exam was performed according to the departmental dose-optimization program which includes automated exposure control, adjustment of the mA and/or kV according to patient size and/or use of iterative reconstruction technique. COMPARISON:  11/06/2022 FINDINGS: Brain: No evidence of acute infarction, hemorrhage, hydrocephalus, extra-axial collection or mass lesion/mass effect. Chronic atrophic and ischemic changes are noted. Vascular: No hyperdense vessel or unexpected calcification. Skull: Normal. Negative for fracture or focal lesion. Sinuses/Orbits: No acute finding. Other: None. IMPRESSION: Chronic atrophic and ischemic changes without acute abnormality. Electronically Signed   By: Alcide Clever M.D.   On: 02/19/2023 18:05   DG Chest Port 1 View  Result Date: 02/19/2023 CLINICAL DATA:  Sepsis EXAM: PORTABLE CHEST 1 VIEW COMPARISON:  11/06/2022 FINDINGS: Underinflation. Normal cardiopericardial silhouette. Overlapping cardiac leads. Degenerative changes of the spine. Subtle hazy opacity in the left lung base. Mild infiltrates possible. Recommend follow-up. No pneumothorax, edema or effusion. IMPRESSION: Underinflation with questionable mild opacity left lung base. Recommend follow-up Electronically Signed   By: Karen Kays M.D.   On: 02/19/2023 15:18   (Echo, Carotid, EGD, Colonoscopy, ERCP)    Subjective: No complaints  Discharge Exam: Vitals:   03/18/23 2014 03/19/23 0728  BP: 102/80 126/81  Pulse:  81 68  Resp: 18 17  Temp: 98.8 F (37.1 C) 98.1 F (36.7 C)  SpO2: 100% 99%   Vitals:   03/18/23 0803 03/18/23 1623 03/18/23 2014 03/19/23 0728  BP: 116/72 114/69 102/80 126/81  Pulse: 63 76 81 68  Resp: 18 18 18 17   Temp: 98 F (36.7 C) 98 F (36.7 C) 98.8 F (37.1 C) 98.1 F (36.7 C)  TempSrc:   Oral Oral  SpO2: 97% 96% 100% 99%  Weight:      Height:        General: Pt is alert, awake, not in acute distress Cardiovascular: RRR, S1/S2 +, no rubs, no gallops Respiratory: CTA bilaterally, no wheezing, no rhonchi Abdominal: Soft, NT, ND, bowel sounds + Extremities: no edema, no cyanosis    The results of significant diagnostics from this hospitalization (including imaging, microbiology, ancillary and laboratory) are listed below for reference.     Microbiology: Recent Results (from the past 240 hour(s))  Culture, blood (Routine X 2) w Reflex to ID Panel     Status: None (Preliminary result)   Collection Time: 03/14/23  8:30 PM   Specimen: Left Antecubital; Blood  Result Value Ref Range Status   Specimen Description   Final    LEFT ANTECUBITAL Performed at [facility], [address], [place], [place] [address]    Special Requests   Final    BOTTLES DRAWN AEROBIC AND ANAEROBIC Blood Culture adequate volume Performed at [facility], [address] [place], [place] [address]    Culture   Final    NO GROWTH 4 DAYS Performed at [facility], [address] [place], [place] [address]    Report Status PENDING  Incomplete  Culture, blood (Routine X 2) w Reflex to ID Panel     Status:  None (Preliminary result)   Collection Time: 03/14/23  8:30 PM   Specimen: Site Not Specified; Blood  Result Value Ref Range Status   Specimen Description   Final    SITE NOT SPECIFIED Performed at [facility], [address] [place], [place] [address]    Special Requests   Final    BOTTLES DRAWN AEROBIC AND ANAEROBIC  Blood Culture adequate volume Performed at [facility], [address] [place], [place] [address]    Culture   Final    NO GROWTH 4 DAYS Performed at [facility], [address] [place], [place] [address]    Report Status PENDING  Incomplete  Blood culture (routine x 2)     Status: None (Preliminary result)   Collection Time: 03/16/23  3:16 PM   Specimen: BLOOD RIGHT HAND  Result Value Ref Range Status   Specimen Description BLOOD RIGHT HAND  Final   Special Requests   Final    BOTTLES DRAWN AEROBIC AND ANAEROBIC Blood Culture adequate volume   Culture   Final    NO GROWTH 3 DAYS Performed at [facility], [address] [place], [place] [address]    Report Status PENDING  Incomplete  Blood culture (routine x 2)     Status: None (Preliminary result)   Collection Time: 03/16/23  3:21 PM   Specimen: BLOOD RIGHT HAND  Result Value Ref Range Status   Specimen Description BLOOD RIGHT HAND  Final   Special Requests   Final    BOTTLES DRAWN AEROBIC ONLY Blood Culture adequate volume   Culture   Final    NO GROWTH 3 DAYS Performed at [facility], [address] [place], [place] [address]    Report Status PENDING  Incomplete  Urine Culture     Status: None   Collection Time: 03/16/23  3:25 PM   Specimen: Urine, Catheterized  Result Value Ref Range Status   Specimen Description URINE, CATHETERIZED  Final   Special Requests NONE  Final   Culture   Final    NO GROWTH Performed at [facility], [address] [place], [place] [address]    Report Status 03/17/2023 FINAL  Final     Labs: BNP (last 3 results) No results for input(s): "BNP" in the last 8760 hours. Basic Metabolic Panel: Recent Labs  Lab 03/14/23 2030 03/16/23 1511 03/17/23 0540  NA 142 141 137  K 3.1* 3.2* 3.8  CL 105 104 107  CO2 29 26 24   GLUCOSE 101* 143* 102*  BUN 15 11 11   CREATININE 0.77 0.96 0.95  CALCIUM 9.2 9.4 8.4*   Liver Function  Tests: Recent Labs  Lab 03/14/23 2030 03/16/23 1511  AST 21 23  ALT 16 15  ALKPHOS 119 120  BILITOT 0.7 0.8  PROT 7.6 6.7  ALBUMIN 3.6 3.1*   No results for input(s): "LIPASE", "AMYLASE" in the last 168 hours. Recent Labs  Lab 03/14/23 2030  AMMONIA 11   CBC: Recent Labs  Lab 03/14/23 2030 03/16/23 1511 03/17/23 0540  WBC 7.5 5.5 6.1  NEUTROABS 5.5 3.8  --   HGB 12.0* 12.7* 10.0*  HCT 36.9* 38.9* 30.8*  MCV 87.6 85.1 84.4  PLT 195 207 175   Cardiac Enzymes: No results for input(s): "CKTOTAL", "CKMB", "CKMBINDEX", "TROPONINI" in the last 168 hours. BNP: Invalid input(s): "POCBNP" CBG: Recent Labs  Lab 03/14/23 2011  GLUCAP 99   D-Dimer No results for  input(s): "DDIMER" in the last 72 hours. Hgb A1c No results for input(s): "HGBA1C" in the last 72 hours. Lipid Profile No results for input(s): "CHOL", "HDL", "LDLCALC", "TRIG", "CHOLHDL", "LDLDIRECT" in the last 72 hours. Thyroid function studies Recent Labs    03/16/23 1516  TSH 3.744   Anemia work up No results for input(s): "VITAMINB12", "FOLATE", "FERRITIN", "TIBC", "IRON", "RETICCTPCT" in the last 72 hours. Urinalysis    Component Value Date/Time   COLORURINE YELLOW 03/16/2023 1525   APPEARANCEUR CLEAR 03/16/2023 1525   LABSPEC 1.020 03/16/2023 1525   PHURINE 6.5 03/16/2023 1525   GLUCOSEU NEGATIVE 03/16/2023 1525   HGBUR MODERATE (A) 03/16/2023 1525   BILIRUBINUR NEGATIVE 03/16/2023 1525   KETONESUR NEGATIVE 03/16/2023 1525   PROTEINUR NEGATIVE 03/16/2023 1525   UROBILINOGEN 1.0 12/05/2008 2150   NITRITE NEGATIVE 03/16/2023 1525   LEUKOCYTESUR TRACE (A) 03/16/2023 1525   Sepsis Labs Recent Labs  Lab 03/14/23 2030 03/16/23 1511 03/17/23 0540  WBC 7.5 5.5 6.1   Microbiology Recent Results (from the past 240 hour(s))  Culture, blood (Routine X 2) w Reflex to ID Panel     Status: None (Preliminary result)   Collection Time: 03/14/23  8:30 PM   Specimen: Left Antecubital; Blood  Result  Value Ref Range Status   Specimen Description   Final    LEFT ANTECUBITAL Performed at [facility], [address], [place], [place] [address]    Special Requests   Final    BOTTLES DRAWN AEROBIC AND ANAEROBIC Blood Culture adequate volume Performed at [facility], [address], [place], [place] [address]    Culture   Final    NO GROWTH 4 DAYS Performed at [facility], [address] [place], [place] [address]    Report Status PENDING  Incomplete  Culture, blood (Routine X 2) w Reflex to ID Panel     Status: None (Preliminary result)   Collection Time: 03/14/23  8:30 PM   Specimen: Site Not Specified; Blood  Result Value Ref Range Status   Specimen Description   Final    SITE NOT SPECIFIED Performed at [facility], [address], [place], [place] [address]    Special Requests   Final    BOTTLES DRAWN AEROBIC AND ANAEROBIC Blood Culture adequate volume Performed at [facility], [address], [place], [place] [address]    Culture   Final    NO GROWTH 4 DAYS Performed at [facility], [address] [place], [place] [address]    Report Status PENDING  Incomplete  Blood culture (routine x 2)     Status: None (Preliminary result)   Collection Time: 03/16/23  3:16 PM   Specimen: BLOOD RIGHT HAND  Result Value Ref Range Status   Specimen Description BLOOD RIGHT HAND  Final   Special Requests   Final    BOTTLES DRAWN AEROBIC AND ANAEROBIC Blood Culture adequate volume   Culture   Final    NO GROWTH 3 DAYS Performed at [facility], [address], [place], [place] [address]    Report Status PENDING  Incomplete  Blood culture (routine x 2)     Status: None (Preliminary result)   Collection Time: 03/16/23  3:21 PM   Specimen: BLOOD RIGHT HAND  Result Value Ref Range Status   Specimen Description BLOOD RIGHT HAND  Final   Special Requests   Final    BOTTLES DRAWN AEROBIC  ONLY  Blood Culture adequate volume   Culture   Final    NO GROWTH 3 DAYS Performed at [facility], [address] [place], [place] [address]    Report Status PENDING  Incomplete  Urine Culture     Status: None   Collection Time: 03/16/23  3:25 PM   Specimen: Urine, Catheterized  Result Value Ref Range Status   Specimen Description URINE, CATHETERIZED  Final   Special Requests NONE  Final   Culture   Final    NO GROWTH Performed at [facility], [address], [place], [place] [address]    Report Status 03/17/2023 FINAL  Final     Time coordinating discharge: Over 35 minutes  SIGNED:   Marinda Elk, MD  [facility] 03/19/2023, 12:10 PM Pager   If 7PM-7AM, please contact night-coverage [email or web address] Password [email or web address]

## 2023-03-19 NOTE — TOC Transition Note (Signed)
Transition of Care [facility]) - CM/SW Discharge Note   Patient Details  Name: Jeff Wilson MRN: [id number] Date of Birth: 07/17/1940  Transition of [facility]) CM/SW Contact:  Partick Musselman A [place], Theresia Majors Phone Number: 03/19/2023, 1:55 PM   Clinical Narrative:      Patient will DC to: [facility]  Anticipated DC date: 03/19/23  Family notified: April [facility]  Transport by: Sharin Mons      Per MD patient ready for DC to [facility]. RN, patient, patient's family, and facility notified of DC. Discharge Summary and FL2 sent to facility. RN to call report prior to discharge ( 409W, [phone]). DC packet on chart. Ambulance transport requested for patient.     CSW will sign off for now as social work intervention is no longer needed. Please consult [place] again if new needs arise.   Final next level of care: Long Term Nursing Home Barriers to Discharge: Barriers Resolved   Patient Goals and CMS Choice      Discharge Placement                Patient chooses bed at: [facility] Patient to be transferred to facility by: [facility] Name of family member notified: April Watkins Patient and family notified of of transfer: 03/19/23  Discharge Plan and Services Additional resources added to the After Visit Summary for                                       Social Determinants of Health (SDOH) Interventions SDOH Screenings   Food Insecurity: Patient Unable To Answer (03/17/2023)  Housing: High Risk (03/17/2023)  Transportation Needs: No Transportation Needs (03/17/2023)  Utilities: Not At Risk (03/17/2023)  Financial Resource Strain: Low Risk  (01/28/2019)   Received from [facility] visits prior to 06/29/2022., [facility] visits prior to 06/29/2022.  Social Connections: Socially Isolated (01/28/2019)   Received from [facility] visits prior to 06/29/2022., [facility] visits prior to 06/29/2022.  Tobacco Use: Medium Risk (03/16/2023)     Readmission Risk Interventions    12/17/2021   12:10 PM  Readmission Risk Prevention Plan  Transportation Screening Complete  PCP or Specialist Appt within 5-7 Days Complete  Home Care Screening Complete  Medication Review (RN CM) Complete

## 2023-03-19 NOTE — Progress Notes (Signed)
Report called to [facility] LPN at [facility]. All questions answered.

## 2023-03-19 NOTE — Evaluation (Signed)
Physical Therapy Evaluation Patient Details Name: Jeff Wilson MRN: [id number] DOB: 1941/04/14 Today's Date: 03/19/2023  History of Present Illness  Jeff Wilson is a 82 yo male who presented from facility due to AMS. Admitted for PNA. PMHx: Hypertension, hyperlipidemia, TSA and dementia  Clinical Impression  Received pt semi-reclined in bed A&O to self only. Per chart review, pt from SNF with unknown baseline mobility and ADL status and pt's cognitive impairments limiting subject history of evaluation. Conflicting information on pt being ambulatory vs WC/bed bound. Pt required max A for bed mobility and for static sitting balance with inability to follow simple one step commands. Pt demonstrated poor sequencing, motor planning, and problem solving. Did not attempt to stand due to poor sitting balance and pt's inability to follow instructions to "relax"/ flex knees to get feet on floor - pt appeared to be fearful of falling and c/o increased pain with mobility. Pt would benefit from continued PT services at SNF to work on current impairments.       If plan is discharge home, recommend the following: Two people to help with walking and/or transfers;Two people to help with bathing/dressing/bathroom;Direct supervision/assist for medications management;Supervision due to cognitive status;Assistance with cooking/housework;Assist for transportation;Assistance with feeding;Help with stairs or ramp for entrance;Direct supervision/assist for financial management   Can travel by private vehicle   No    Equipment Recommendations Hospital bed;Hoyer lift;Wheelchair (measurements PT)  Recommendations for Other Services       Functional Status Assessment Patient has had a recent decline in their functional status and/or demonstrates limited ability to make significant improvements in function in a reasonable and predictable amount of time     Precautions / Restrictions Precautions Precautions:  Fall Restrictions Weight Bearing Restrictions: No      Mobility  Bed Mobility Overal bed mobility: Needs Assistance Bed Mobility: Rolling, Sit to Supine, Supine to Sit Rolling: Max assist   Supine to sit: Max assist, HOB elevated, Used rails Sit to supine: Max assist   General bed mobility comments: Pt with minimal initiation and difficulty with sequencing cues provided. Upon sitting EOB, pt with posterior lean, requiring max A for static sitting balance and with bilateral knees in extension, pushing posteriorly Patient Response: Flat affect  Transfers                   General transfer comment: did not attempt to stand due to very poor sitting balance and difficulty sequencing cues to bend knees and place feet on floor. Pt appeared very fearful of falling upon sitting up.    Ambulation/Gait               General Gait Details: unable due to safety concerns  Stairs            Wheelchair Mobility     Tilt Bed Tilt Bed Patient Response: Flat affect  Modified Rankin (Stroke Patients Only)       Balance Overall balance assessment: Needs assistance Sitting-balance support: Feet unsupported, Bilateral upper extremity supported Sitting balance-Leahy Scale: Zero Sitting balance - Comments: pt required max A for static sitting balance. cues provided to place feet on floor and hold onto footboard/EOB for support. Pt able to maintain static sitting balance for ~5 seconds with BUE support and close supervision before demo posterior LOB Postural control: Posterior lean     Standing balance comment: unable due to safety concerns and limited by cognition  Pertinent Vitals/Pain Pain Assessment Pain Assessment:  (pt initally denied any pain, but with mobility reported pain increased "all over") Faces Pain Scale: Hurts whole lot Pain Intervention(s): Monitored during session, Repositioned    Home Living Family/patient  expects to be discharged to:: Skilled nursing facility                   Additional Comments: per chart review pt coming from [facility]    Prior Function Prior Level of Function : Needs assist  Cognitive Assist : Mobility (cognitive);ADLs (cognitive)     Physical Assist : Mobility (physical);ADLs (physical)     Mobility Comments: pt states he was ambulatory, however conflicting information from chart review stating pt was ambulatory but also bed/WC bound. Based off bed mobility and sitting balance, anticipate pt needed max A/+2 assist with mobility and ADLs and was not ambulatory prior to admission       Extremity/Trunk Assessment   Upper Extremity Assessment Upper Extremity Assessment: Defer to OT evaluation    Lower Extremity Assessment Lower Extremity Assessment: Generalized weakness;Difficult to assess due to impaired cognition (pt with difficulty flexing/relaxing knees, demo extensor-like tone with mobility - mostl likely due to tensing up and fear of falling)    Cervical / Trunk Assessment Cervical / Trunk Assessment: Kyphotic  Communication   Communication Communication: Difficulty following commands/understanding Following commands: Follows one step commands inconsistently Cueing Techniques: Verbal cues;Tactile cues  Cognition Arousal: Alert Behavior During Therapy: Flat affect Overall Cognitive Status: History of cognitive impairments - at baseline                                 General Comments: Pt able to state his name and birthday. Pt disoriented to date, time, and location and unsure of where he lives. Pt inconsistantly followed one step commands throughout session.        General Comments      Exercises     Assessment/Plan    PT Assessment All further PT needs can be met in the next venue of care  PT Problem List Decreased strength;Decreased range of motion;Decreased activity tolerance;Decreased balance;Decreased  mobility;Decreased coordination;Decreased safety awareness;Decreased cognition       PT Treatment Interventions      PT Goals (Current goals can be found in the Care Plan section)  Acute Rehab PT Goals Patient Stated Goal: unable to participate in goal setting. All assessment and education complete - D/C from therapy    Frequency       Co-evaluation               AM-PAC PT "6 Clicks" Mobility  Outcome Measure Help needed turning from your back to your side while in a flat bed without using bedrails?: Total Help needed moving from lying on your back to sitting on the side of a flat bed without using bedrails?: Total Help needed moving to and from a bed to a chair (including a wheelchair)?: Total Help needed standing up from a chair using your arms (e.g., wheelchair or bedside chair)?: Total Help needed to walk in hospital room?: Total Help needed climbing 3-5 steps with a railing? : Total 6 Click Score: 6    End of Session     Patient left: in bed;with call bell/phone within reach;with bed alarm set Nurse Communication: Mobility status;Need for lift equipment PT Visit Diagnosis: Muscle weakness (generalized) (M62.81)    Time: 5284-1324 PT Time Calculation (min) (  ACUTE ONLY): 17 min   Charges:   PT Evaluation $PT Eval Moderate Complexity: 1 Mod   PT General Charges $$ ACUTE PT VISIT: 1 Visit         Blima Rich PT, DPT [facility]ger 03/19/2023, 11:46 AM

## 2023-03-20 LAB — CULTURE, BLOOD (ROUTINE X 2)
Culture: NO GROWTH
Culture: NO GROWTH
Special Requests: ADEQUATE
Special Requests: ADEQUATE

## 2023-03-21 LAB — CULTURE, BLOOD (ROUTINE X 2)
Culture: NO GROWTH
Culture: NO GROWTH
Special Requests: ADEQUATE
Special Requests: ADEQUATE

## 2023-04-10 ENCOUNTER — Emergency Department (HOSPITAL_COMMUNITY): Payer: Medicare HMO

## 2023-04-10 ENCOUNTER — Emergency Department (HOSPITAL_COMMUNITY)
Admission: EM | Admit: 2023-04-10 | Discharge: 2023-04-10 | Disposition: A | Discharge status: Home / Self Care | Payer: Medicare HMO | Attending: Emergency Medicine | Admitting: Emergency Medicine

## 2023-04-10 DIAGNOSIS — F0390 Unspecified dementia without behavioral disturbance: Secondary | ICD-10-CM | POA: Diagnosis not present

## 2023-04-10 DIAGNOSIS — R569 Unspecified convulsions: Secondary | ICD-10-CM

## 2023-04-10 DIAGNOSIS — Z7901 Long term (current) use of anticoagulants: Secondary | ICD-10-CM | POA: Diagnosis not present

## 2023-04-10 DIAGNOSIS — M62838 Other muscle spasm: Secondary | ICD-10-CM | POA: Insufficient documentation

## 2023-04-10 DIAGNOSIS — R4182 Altered mental status, unspecified: Secondary | ICD-10-CM

## 2023-04-10 LAB — URINALYSIS, W/ REFLEX TO CULTURE (INFECTION SUSPECTED)
Bilirubin Urine: NEGATIVE
Glucose, UA: NEGATIVE mg/dL
Hgb urine dipstick: NEGATIVE
Ketones, ur: NEGATIVE mg/dL
Leukocytes,Ua: NEGATIVE
Nitrite: NEGATIVE
Protein, ur: NEGATIVE mg/dL
Specific Gravity, Urine: 1.015 (ref 1.005–1.030)
pH: 5 (ref 5.0–8.0)

## 2023-04-10 LAB — COMPREHENSIVE METABOLIC PANEL
ALT: 27 U/L (ref 0–44)
AST: 31 U/L (ref 15–41)
Albumin: 2.9 g/dL — ABNORMAL LOW (ref 3.5–5.0)
Alkaline Phosphatase: 99 U/L (ref 38–126)
Anion gap: 7 (ref 5–15)
BUN: 10 mg/dL (ref 8–23)
CO2: 30 mmol/L (ref 22–32)
Calcium: 9 mg/dL (ref 8.9–10.3)
Chloride: 105 mmol/L (ref 98–111)
Creatinine, Ser: 0.92 mg/dL (ref 0.61–1.24)
GFR, Estimated: >60 mL/min (ref >60)
Glucose, Bld: 139 mg/dL — ABNORMAL HIGH (ref 70–99)
Potassium: 3.6 mmol/L (ref 3.5–5.1)
Sodium: 142 mmol/L (ref 135–145)
Total Bilirubin: 0.7 mg/dL (ref <1.2)
Total Protein: 6.2 g/dL — ABNORMAL LOW (ref 6.5–8.1)

## 2023-04-10 LAB — CBC WITH DIFFERENTIAL/PLATELET
Abs Immature Granulocytes: 0.01 10*3/uL (ref 0.00–0.07)
Basophils Absolute: 0 10*3/uL (ref 0.0–0.1)
Basophils Relative: 1 %
Eosinophils Absolute: 0 10*3/uL (ref 0.0–0.5)
Eosinophils Relative: 1 %
HCT: 35.1 % — ABNORMAL LOW (ref 39.0–52.0)
Hemoglobin: 11.5 g/dL — ABNORMAL LOW (ref 13.0–17.0)
Immature Granulocytes: 0 %
Lymphocytes Relative: 13 %
Lymphs Abs: 0.7 10*3/uL (ref 0.7–4.0)
MCH: 28.4 pg (ref 26.0–34.0)
MCHC: 32.8 g/dL (ref 30.0–36.0)
MCV: 86.7 fL (ref 80.0–100.0)
Monocytes Absolute: 0.4 10*3/uL (ref 0.1–1.0)
Monocytes Relative: 6 %
Neutro Abs: 4.5 10*3/uL (ref 1.7–7.7)
Neutrophils Relative %: 79 %
Platelets: 195 10*3/uL (ref 150–400)
RBC: 4.05 MIL/uL — ABNORMAL LOW (ref 4.22–5.81)
RDW: 15.1 % (ref 11.5–15.5)
WBC: 5.7 10*3/uL (ref 4.0–10.5)
nRBC: 0 % (ref 0.0–0.2)

## 2023-04-10 LAB — I-STAT CG4 LACTIC ACID, ED: Lactic Acid, Venous: 0.5 mmol/L (ref 0.5–1.9)

## 2023-04-10 LAB — TROPONIN I (HIGH SENSITIVITY)
Troponin I (High Sensitivity): 7 ng/L (ref <18)
Troponin I (High Sensitivity): 8 ng/L (ref <18)

## 2023-04-10 MED ORDER — LEVETIRACETAM 250 MG PO TABS: 250.0000 mg | ORAL_TABLET | Freq: Two times a day (BID) | ORAL | 3 refills | Status: DC

## 2023-04-10 MED ORDER — LEVETIRACETAM 250 MG PO TABS: 250.0000 mg | ORAL_TABLET | Freq: Two times a day (BID) | ORAL | Status: DC

## 2023-04-10 NOTE — Discharge Instructions (Signed)
1.  At this time it appears likely that you had a seizure.  You are being started on new medication called Keppra.  You are to take Keppra 250 mg twice a day. 2.  You should have follow-up with the neurologist for recheck as soon as possible.  See your family doctor as well. 3.  Return to the emergency department if new or concerning changes develop.

## 2023-04-10 NOTE — ED Provider Notes (Signed)
Witnessed, shaking and grunting at SNF.  Patient has history of dementia and does not recall episode.  Plan for second troponin and neurology consult.  Definitive management per neurology consult Physical Exam  BP (!) 141/79   Pulse 86   Temp 98.8 F (37.1 C)   Resp 17   SpO2 100%   Physical Exam  Procedures  Procedures  ED Course / MDM    Medical Decision Making Amount and/or Complexity of Data Reviewed Labs: ordered. Radiology: ordered.  Risk Prescription drug management.  Consult done by neurology Dr. Amada Jupiter.  He advises this was likely a seizure in association with dementia.  Plan will be to start Keppra 250 mg twice daily and return to SNF.        Arby Barrette, MD 04/10/23 [phone]

## 2023-04-10 NOTE — Consult Note (Signed)
NEUROLOGY CONSULT NOTE   Date of service: April 10, 2023 Patient Name: Jeff Wilson MRN:  [id number] DOB:  06/21/1940 Chief Complaint: "altered mental status" Requesting Provider: Arby Barrette, MD  History of Present Illness  Draysen Lowery is a 82 y.o. male with PMH significant for dementia, HLD, TIA, stroke, hypotension who presents to ED due to shaking. Patient does not remember why he came to the hospital or remember the episode when I explained it to him.  I spoke with the facility RN [phone]). She states that, he was "shaking like he was having a severe muscle spasm" and he was not talking or answering questions, he was "only grunting". They asked the doctor to come in and evaluate him and she suggested that they call to bring him to the hospital because "she thought he might have an infection or something". RN did state that he had a similar episode about 2 months ago with an unknown cause.  Facility RN denied patient ever having staring spells, repetitive movements or moments where he does not respond.   03/19/23: Discharged after being admitted for toxic-metabolic encephalopathy after brought in with symptoms described as concerns of staring off and not answering questions from staff. rEEG was negative.  02/22/23: Discharged after being admitted for generalized weakness, low BP and AMS and was found to have a UTI.  11/06/22: Came to ED due to sudden onset of nonverbal and minimal responsiveness. 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 On reevaluation, he was awake and alert, answering questions appropriately. He was discharged from the ED back to SNF.     ROS   Unable to ascertain due to hx of dementia  Past History   Past Medical History:  Diagnosis Date   Dementia (HCC)    Hyperlipemia    TIA (transient ischemic attack)     No past surgical history on file.  Family History: Family History  Problem Relation Age of Onset   Heart disease Neg Hx     Social History   reports that he has quit smoking. He has never used smokeless tobacco. No history on file for alcohol use and drug use.  No Known Allergies  Medications  No current facility-administered medications for this encounter.  Current Outpatient Medications:    amLODipine (NORVASC) 5 MG tablet, Take 5 mg by mouth daily., Disp: , Rfl:    atorvastatin (LIPITOR) 40 MG tablet, Take 40 mg by mouth daily., Disp: , Rfl:    calcium carbonate (TUMS - DOSED IN MG ELEMENTAL CALCIUM) 500 MG chewable tablet, Chew 1 tablet by mouth daily., Disp: , Rfl:    clopidogrel (PLAVIX) 75 MG tablet, Take 75 mg by mouth daily., Disp: , Rfl:    Multiple Vitamin (MULTIVITAMIN WITH MINERALS) TABS tablet, Take 1 tablet by mouth daily., Disp: , Rfl:    polyethylene glycol (MIRALAX / GLYCOLAX) 17 g packet, Take 17 g by mouth daily., Disp: , Rfl:    sennosides-docusate sodium (SENOKOT-S) 8.6-50 MG tablet, Take 2 tablets by mouth daily., Disp: , Rfl:    Vitamin D, Ergocalciferol, (DRISDOL) 1.25 MG (50000 UNIT) CAPS capsule, Take 50,000 Units by mouth every Monday., Disp: , Rfl:   Vitals   Vitals:   04/10/23 1419 04/10/23 1430 04/10/23 1445 04/10/23 1500  BP: (!) 152/91 (!) 147/80 (!) 144/91 (!) 141/79  Pulse: 91 79 85 86  Resp: 20 17 18 17   Temp: 98.8 F (37.1 C)     SpO2: 100% 100% 100% 100%    There  is no height or weight on file to calculate BMI.  Physical Exam   Constitutional: Appears elderly, frail Psych: flat affect Eyes: No scleral injection.  HENT: No OP obstruction.  Head: Normocephalic.  Cardiovascular: Normal rate and regular rhythm.  Respiratory: Effort normal, non-labored breathing.  GI: Soft.  No distension. There is no tenderness.  Skin: WDI.   Neurologic Examination   Neuro: Mental Status: Patient is awake, alert. He is oriented to self, place and year. When he is told we are in the month that Christmas is in, he says, December. He is able to hold a conversation, name simple objects and repeat  small phrases.  No dysarthria.  Cranial Nerves: II: Pupils are equal, round, and reactive to light.  Blinks to threat bilaterally III,IV, VI: EOMI without ptosis or diploplia. Control and instrumentation engineer. V: Facial sensation is symmetric to light touch VII: Facial movement is symmetric.  VIII: hearing is intact to voice X: Uvula elevates symmetrically XI: Shoulder shrug is symmetric. XII: tongue is midline without atrophy or fasciculations.  Motor: Tone is normal. Bulk is normal.  Generalized weakness. He si wc bound at baseline, able to turn and feed himself.  RUE: 5/5 no drift LUE: 4-/5, drift. 4/5 grip.  RLE: 3/5 only able to lift slightly off bed LLE: 3/5 able to lift a little higher off bed  Sensory: Sensation is symmetric to light touch and temperature in the arms and legs. Cerebellar: Unable to perform   Labs/Imaging/Neurodiagnostic studies   CBC:  Recent Labs  Lab Apr 21, 2023 1530  WBC 5.7  NEUTROABS 4.5  HGB 11.5*  HCT 35.1*  MCV 86.7  PLT 195   Basic Metabolic Panel:  Lab Results  Component Value Date   NA 142 04/21/23   K 3.6 Apr 21, 2023   CO2 30 2023/04/21   GLUCOSE 139 (H) 2023-04-21   BUN 10 04/21/23   CREATININE 0.92 21-Apr-2023   CALCIUM 9.0 04-21-23   GFRNONAA >60 04-21-2023   GFRAA  12/05/2008    >60        The eGFR has been calculated using the MDRD equation. This calculation has not been validated in all clinical situations. eGFR's persistently <60 mL/min signify possible Chronic Kidney Disease.   Lipid Panel: No results found for: "LDLCALC" HgbA1c: No results found for: "HGBA1C" Urine Drug Screen:     Component Value Date/Time   LABOPIA NONE DETECTED 11/06/2022 1730   COCAINSCRNUR NONE DETECTED 11/06/2022 1730   LABBENZ NONE DETECTED 11/06/2022 1730   AMPHETMU NONE DETECTED 11/06/2022 1730   THCU NONE DETECTED 11/06/2022 1730   LABBARB NONE DETECTED 11/06/2022 1730    Alcohol Level     Component Value Date/Time   ETH <10 11/06/2022  1456   INR  Lab Results  Component Value Date   INR 1.2 11/06/2022   APTT  Lab Results  Component Value Date   APTT 34 11/06/2022    CT Head without contrast(Personally reviewed): No evidence of acute intracranial abnormality Parenchymal atrophy, 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  These are consistent with prior head CT 03/16/2023.    ASSESSMENT   Jakhari Olley is a 82 y.o. male with PMH significant for dementia, HLD, TIA, stroke, hypotension who presents to ED due to shaking. Patient does not remember why he came to the hospital or remember the episode when I explained it to him. At baseline, he lives in a SNF, is mostly bed and  wheelchair bound, but is able to hold simple conversations and interact with staff.  Looking back at notes, he has had multiple episodes of unresponsiveness or altered mental status in the past few months that have brought him into the hospital with toxic-metabolic encephalopathy, sepsis due to UTI as diagnoses.  Shaking and grunting has not been described however in any of the previous admissions, and this sounds like seizure activity.  RECOMMENDATIONS   - Start Keppra 250mg  BID - Follow-up with outpatient nuerology  _______________________________________________________________  Pt seen by Neuro NP/APP and later by MD. Note/plan to be edited by MD as needed.    Lynnae January, DNP, AGACNP-BC [facility] Please use AMION for contact information & EPIC for messaging.  I have seen the patient and reviewed the above note.  The description that nursing home gives of a patient who has eyes open, but diffuse muscle spasms with no speech and only "grunting" lasting for a couple of minutes followed by confusion is highly concerning for seizure.  On review of his notes he has had multiple episodes where he comes in with decreased responsiveness and rapidly  improves in the emergency department.  Some of these have been associated with hypotension, but not all of them.  In the setting of dementia, he is at risk for seizures, and based on this series of events, I think it would be prudent to start an antiepileptic at this time.  He has had an EEG in the past which is negative, but this does not preclude the diagnosis of epilepsy.  I would start off at a relatively low dose of Keppra, and we can uptitrate if needed.  Please call with further questions or concerns.  Ritta Slot, MD [facility] [phone]  If 7pm- 7am, please page neurology on call as listed in AMION.

## 2023-04-10 NOTE — ED Triage Notes (Signed)
Pt BIB [facility] from [facility].  Initially called out for fever.  Per EMS facility got an oral temp of 98 on scene.  "Shaking' and not being at baseline was reported by facility.to EMS.  Best LKW per facility was when they passed meds at 9:30.    Baseline is that pt tracks with eyes and will look at you when you talk.Pt will also speak some and is oriented to Self and place.  On arrival EMS endorses that pt was not following commands, not tracking with eyes and not verbalizing.  After about 30 min EMS reports that the pt began improving.  En route and on arrival the pt was back to baseline. Pt complained of a little CP to EMS.   Pt has hx of Stroke and tia.  EMS VS: 190/100 99% HR 89, ETC02 36, RR 20, CBG 143

## 2023-04-10 NOTE — ED Provider Notes (Signed)
[place] EMERGENCY DEPARTMENT AT [facility] Provider Note   CSN: [id number] Arrival date & time: 04/10/23  1353     History  Chief Complaint  Patient presents with   Altered Mental Status    Jeff Wilson is a 82 y.o. male.  HPI 82 year old male presents with shaking.  History is primarily from the nurse who evaluated the patient at his facility over the phone.  She notes that he was normal this morning and when they went into the room later in the morning he was shaking, and she described it like he was having diffuse muscle spasms.  He was "awake" but when I asked her to go into more detail it seems like his eyes were open but not tracking/staring off into space.  He was breathing and had a normal temperature.  However he was making a grunting noise and would not really respond.  She estimated this lasted an hour.  The doctor examined him and told him to call 911.  Normally he is in a wheelchair, can say yes/no, knows where he is (disoriented to year).  He had a similar episode a few months ago but is unclear what the cause was.  The patient tells me that he had some chest pain that has resolved.  He estimates it was a couple hours ago.  He is oriented to location and month but disoriented to time.  It is unclear if he really had chest pain this morning or not.  Home Medications Prior to Admission medications   Medication Sig Start Date End Date Taking? Authorizing Provider  amLODipine (NORVASC) 5 MG tablet Take 5 mg by mouth daily.   Yes [provider]  atorvastatin (LIPITOR) 40 MG tablet Take 40 mg by mouth daily. 02/18/23  Yes [provider]  calcium carbonate (TUMS - DOSED IN MG ELEMENTAL CALCIUM) 500 MG chewable tablet Chew 1 tablet by mouth daily.   Yes [provider]  clopidogrel (PLAVIX) 75 MG tablet Take 75 mg by mouth daily.   Yes [provider]  Multiple Vitamin (MULTIVITAMIN WITH MINERALS) TABS tablet Take 1 tablet by  mouth daily. 12/18/21  Yes Standley Brooking, MD  polyethylene glycol (MIRALAX / GLYCOLAX) 17 g packet Take 17 g by mouth daily.   Yes [provider]  sennosides-docusate sodium (SENOKOT-S) 8.6-50 MG tablet Take 2 tablets by mouth daily.   Yes [provider]  Vitamin D, Ergocalciferol, (DRISDOL) 1.25 MG (50000 UNIT) CAPS capsule Take 50,000 Units by mouth every Monday.    [provider]      Allergies    Patient has no known allergies.    Review of Systems   Review of Systems  Unable to perform ROS: Dementia    Physical Exam Updated Vital Signs BP (!) 141/79   Pulse 86   Temp 98.8 F (37.1 C)   Resp 17   SpO2 100%  Physical Exam Vitals and nursing note reviewed.  Constitutional:      General: He is not in acute distress.    Appearance: He is well-developed. He is not ill-appearing or diaphoretic.  HENT:     Head: Normocephalic and atraumatic.  Cardiovascular:     Rate and Rhythm: Normal rate and regular rhythm.     Heart sounds: Normal heart sounds.  Pulmonary:     Effort: Pulmonary effort is normal.     Breath sounds: Normal breath sounds.  Abdominal:     General: There is no distension.  Palpations: Abdomen is soft.     Tenderness: There is no abdominal tenderness.  Skin:    General: Skin is warm and dry.  Neurological:     Mental Status: He is alert.     Comments: Awake, alert, speaks very softly.  Equal strength in upper extremities, 5/5.  However lower extremities can barely be lifted off the stretcher (though speaking to the nurse it sounds like he is wheelchair-bound). He knows where he is and knows it is December but does not know the month.     ED Results / Procedures / Treatments   Labs (all labs ordered are listed, but only abnormal results are displayed) Labs Reviewed  CBC WITH DIFFERENTIAL/PLATELET - Abnormal; Notable for the following components:      Result Value   RBC 4.05 (*)    Hemoglobin 11.5 (*)    HCT 35.1  (*)    All other components within normal limits  COMPREHENSIVE METABOLIC PANEL  URINALYSIS, W/ REFLEX TO CULTURE (INFECTION SUSPECTED)  TROPONIN I (HIGH SENSITIVITY)    EKG EKG Interpretation Date/Time:  Thursday April 10 2023 14:19:28 EST Ventricular Rate:  90 PR Interval:  170 QRS Duration:  76 QT Interval:  407 QTC Calculation: 498 R Axis:   -34  Text Interpretation: Sinus rhythm Ventricular premature complex Left axis deviation Anteroseptal infarct, old diffuse nonspecific changes Confirmed by Pricilla Loveless [phone]) on 04/10/2023 2:40:40 PM  Radiology DG Chest 2 View Result Date: 04/10/2023 CLINICAL DATA:  Chest pain. EXAM: CHEST - 2 VIEW COMPARISON:  Chest radiograph dated 03/16/2023. FINDINGS: No focal consolidation, pleural effusion, or pneumothorax. The cardiac silhouette is within normal limits. Degenerative changes of the spine and osteopenia. No acute osseous pathology. IMPRESSION: No active cardiopulmonary disease. Electronically Signed   By: Elgie Collard M.D.   On: 04/10/2023 16:00    Procedures Procedures    Medications Ordered in ED Medications - No data to display  ED Course/ Medical Decision Making/ A&P                                 Medical Decision Making Amount and/or Complexity of Data Reviewed Independent Historian:     Details: Nursing Home External Data Reviewed: notes. Labs: ordered.    Details: Chronic anemia Radiology: ordered. ECG/medicine tests: ordered and independent interpretation performed.    Details: Nonspecific changes   Sounds like patient may have had a seizure. He was worked up for this in a recent admission with a negative EEG. However, he seems to now be back to his baseline. Head CT pending. I discussed with neurology, Dr. Amada Jupiter, who will evaluate.  From a chest pain perspective, no STEMI or obvious ischemic changes and he is pain free now. Will need troponins, which are currently pending.  Care transferred  to Dr. Donnald Garre.        Final Clinical Impression(s) / ED Diagnoses Final diagnoses:  None    Rx / DC Orders ED Discharge Orders     None         Pricilla Loveless, MD 04/10/23 [phone]

## 2023-05-12 ENCOUNTER — Emergency Department (HOSPITAL_COMMUNITY): Payer: Medicare HMO

## 2023-05-12 ENCOUNTER — Emergency Department (HOSPITAL_COMMUNITY)
Admission: EM | Admit: 2023-05-12 | Discharge: 2023-05-12 | Disposition: A | Discharge status: Home / Self Care | Payer: Medicare HMO | Attending: Emergency Medicine | Admitting: Emergency Medicine

## 2023-05-12 DIAGNOSIS — R569 Unspecified convulsions: Secondary | ICD-10-CM | POA: Diagnosis not present

## 2023-05-12 DIAGNOSIS — I10 Essential (primary) hypertension: Secondary | ICD-10-CM | POA: Insufficient documentation

## 2023-05-12 DIAGNOSIS — Z79899 Other long term (current) drug therapy: Secondary | ICD-10-CM | POA: Diagnosis not present

## 2023-05-12 DIAGNOSIS — R4182 Altered mental status, unspecified: Secondary | ICD-10-CM | POA: Diagnosis present

## 2023-05-12 LAB — LACTIC ACID, PLASMA: Lactic Acid, Venous: 1 mmol/L (ref 0.5–1.9)

## 2023-05-12 LAB — CBC WITH DIFFERENTIAL/PLATELET
Abs Immature Granulocytes: 0.03 10*3/uL (ref 0.00–0.07)
Basophils Absolute: 0 10*3/uL (ref 0.0–0.1)
Basophils Relative: 0 %
Eosinophils Absolute: 0 10*3/uL (ref 0.0–0.5)
Eosinophils Relative: 0 %
HCT: 40.2 % (ref 39.0–52.0)
Hemoglobin: 12.9 g/dL — ABNORMAL LOW (ref 13.0–17.0)
Immature Granulocytes: 0 %
Lymphocytes Relative: 14 %
Lymphs Abs: 1.3 10*3/uL (ref 0.7–4.0)
MCH: 27.6 pg (ref 26.0–34.0)
MCHC: 32.1 g/dL (ref 30.0–36.0)
MCV: 85.9 fL (ref 80.0–100.0)
Monocytes Absolute: 0.5 10*3/uL (ref 0.1–1.0)
Monocytes Relative: 6 %
Neutro Abs: 7.6 10*3/uL (ref 1.7–7.7)
Neutrophils Relative %: 80 %
Platelets: 154 10*3/uL (ref 150–400)
RBC: 4.68 MIL/uL (ref 4.22–5.81)
RDW: 14.6 % (ref 11.5–15.5)
WBC: 9.5 10*3/uL (ref 4.0–10.5)
nRBC: 0 % (ref 0.0–0.2)

## 2023-05-12 LAB — URINALYSIS, ROUTINE W REFLEX MICROSCOPIC
Bacteria, UA: NONE SEEN
Glucose, UA: NEGATIVE mg/dL
Hgb urine dipstick: NEGATIVE
Ketones, ur: 20 mg/dL — AB
Leukocytes,Ua: NEGATIVE
Nitrite: NEGATIVE
Protein, ur: 30 mg/dL — AB
Specific Gravity, Urine: 1.025 (ref 1.005–1.030)
pH: 5 (ref 5.0–8.0)

## 2023-05-12 LAB — TROPONIN I (HIGH SENSITIVITY)
Troponin I (High Sensitivity): 7 ng/L (ref <18)
Troponin I (High Sensitivity): 8 ng/L (ref <18)

## 2023-05-12 LAB — COMPREHENSIVE METABOLIC PANEL
ALT: 14 U/L (ref 0–44)
AST: 25 U/L (ref 15–41)
Albumin: 3.2 g/dL — ABNORMAL LOW (ref 3.5–5.0)
Alkaline Phosphatase: 118 U/L (ref 38–126)
Anion gap: 13 (ref 5–15)
BUN: 16 mg/dL (ref 8–23)
CO2: 26 mmol/L (ref 22–32)
Calcium: 9.4 mg/dL (ref 8.9–10.3)
Chloride: 104 mmol/L (ref 98–111)
Creatinine, Ser: 1.26 mg/dL — ABNORMAL HIGH (ref 0.61–1.24)
GFR, Estimated: 58 mL/min — ABNORMAL LOW (ref >60)
Glucose, Bld: 79 mg/dL (ref 70–99)
Potassium: 3.4 mmol/L — ABNORMAL LOW (ref 3.5–5.1)
Sodium: 143 mmol/L (ref 135–145)
Total Bilirubin: 1.3 mg/dL — ABNORMAL HIGH (ref 0.0–1.2)
Total Protein: 6.6 g/dL (ref 6.5–8.1)

## 2023-05-12 LAB — CBG MONITORING, ED: Glucose-Capillary: 80 mg/dL (ref 70–99)

## 2023-05-12 MED ORDER — LEVETIRACETAM IN NACL 1000 MG/100ML IV SOLN
1000.0000 mg | Freq: Once | INTRAVENOUS | Status: AC
  Administered 2023-05-12: 1000 mg via INTRAVENOUS
  Filled 2023-05-12: qty 100

## 2023-05-12 MED ORDER — LEVETIRACETAM 250 MG PO TABS: 500.0000 mg | ORAL_TABLET | Freq: Two times a day (BID) | ORAL | 0 refills | Status: AC

## 2023-05-12 NOTE — Procedures (Signed)
 Patient Name: Jeff Wilson  MRN: [id number]  Epilepsy Attending: Arlin MALVA Krebs  Referring Physician/Provider: Michaela Aisha SQUIBB, MD  Date: 05/12/2023 Duration: 22.53 mins  Patient history: 83yo M with ams getting eeg to evaluate for seizure  Level of alertness: Awake  AEDs during EEG study:   Technical aspects: This EEG study was done with scalp electrodes positioned according to the 10-20 International system of electrode placement. Electrical activity was reviewed with band pass filter of 1-70Hz , sensitivity of 7 uV/mm, display speed of 65mm/sec with a 60Hz  notched filter applied as appropriate. EEG data were recorded continuously and digitally stored.  Video monitoring was available and reviewed as appropriate.  Description: The posterior dominant rhythm consists of 8 Hz activity of moderate voltage (25-35 uV) seen predominantly in posterior head regions, symmetric and reactive to eye opening and eye closing. Hyperventilation and photic stimulation were not performed.     IMPRESSION: This study is within normal limits. No seizures or epileptiform discharges were seen throughout the recording.  A normal interictal EEG does not exclude the diagnosis of epilepsy.  Aneudy Champlain O Yashira Offenberger

## 2023-05-12 NOTE — Progress Notes (Signed)
 EEG complete - results pending

## 2023-05-12 NOTE — ED Notes (Signed)
 PTAR Notified

## 2023-05-12 NOTE — ED Triage Notes (Signed)
 Coming by [facility] from [facility], staff reported difficult to arouse.  PT told EMS My back hurts. Last seen well last night 8pm.  Hx of dementia, expressive aphasia.  Vitals with EMS: bp 132/76, HR 74, SPO2 100, 119Cbg.  Pt does not reply words back, however. withdraws to pain in bilateral extremities.

## 2023-05-12 NOTE — Discharge Instructions (Signed)
 Mr. Jeff Wilson was seen in the emergency room with what appears to be a seizure-like activity.  We discussed the case with neurologist.  They recommend that we increase the Keppra  to 500 mg twice a day instead of 250 mg twice a day.  We recommend that you monitor him closely for the next few days to see if he is having repeat episodes. Please return him to the ER if there is any mental status change, complains of chest pain, shortness of breath, below blood pressure.

## 2023-05-12 NOTE — ED Provider Notes (Signed)
 Jeff Wilson EMERGENCY DEPARTMENT AT [facility] Provider Note   CSN: [id number] Arrival date & time: 05/12/23  9085     History  Chief Complaint  Patient presents with   Altered Mental Status    Jeff Wilson is a 83 y.o. male.  HPI    83 year old male comes in with chief complaint of altered mental status. Patient has previous history of stroke/TIA, dementia, hypertension and resides at [facility] nursing home.  Patient not providing meaningful history.  He has no complaints at this time for me, but to the nurses few minutes ago he indicated that he had some back pain.  He just wants water for now.  I called [facility] nursing [facility] and spoke with the nurse over there.  She indicated that when they went to check on the patient he had blank stare and was not responding.  They performed sternal rub and he still did not respond and therefore they called 911.  She indicated that the symptoms lasted for several minutes.  Unfortunately, I was unable to catch EMS staff to see when patient became a little bit more responsive.  Nurse also indicates that patient had an episode of blank stare few months ago.  He does not have any history of seizures.  Home Medications Prior to Admission medications   Medication Sig Start Date End Date Taking? Authorizing Provider  amLODipine  (NORVASC ) 5 MG tablet Take 5 mg by mouth daily.   Yes [provider]  atorvastatin  (LIPITOR) 40 MG tablet Take 40 mg by mouth daily. 02/18/23  Yes [provider]  calcium  carbonate (TUMS - DOSED IN MG ELEMENTAL CALCIUM ) 500 MG chewable tablet Chew 1 tablet by mouth daily.   Yes [provider]  clopidogrel  (PLAVIX ) 75 MG tablet Take 75 mg by mouth daily.   Yes [provider]  Multiple Vitamin (MULTIVITAMIN WITH MINERALS) TABS tablet Take 1 tablet by mouth daily. 12/18/21  Yes Jadine Toribio SQUIBB, MD  polyethylene glycol (MIRALAX  / GLYCOLAX ) 17 g packet Take 17 g by mouth  daily.   Yes [provider]  sennosides-docusate sodium  (SENOKOT-S) 8.6-50 MG tablet Take 2 tablets by mouth daily.   Yes [provider]  Vitamin D, Ergocalciferol, (DRISDOL) 1.25 MG (50000 UNIT) CAPS capsule Take 50,000 Units by mouth every Monday.   Yes [provider]  levETIRAcetam  (KEPPRA ) 250 MG tablet Take 2 tablets (500 mg total) by mouth 2 (two) times daily. 05/12/23   Charlyn Sora, MD      Allergies    Patient has no known allergies.    Review of Systems   Review of Systems  Physical Exam Updated Vital Signs BP (!) 134/90   Pulse 75   Temp 98.7 F (37.1 C) (Rectal)   Resp 18   SpO2 97%  Physical Exam  ED Results / Procedures / Treatments   Labs (all labs ordered are listed, but only abnormal results are displayed) Labs Reviewed  CBC WITH DIFFERENTIAL/PLATELET - Abnormal; Notable for the following components:      Result Value   Hemoglobin 12.9 (*)    All other components within normal limits  COMPREHENSIVE METABOLIC PANEL - Abnormal; Notable for the following components:   Potassium 3.4 (*)    Creatinine, Ser 1.26 (*)    Albumin 3.2 (*)    Total Bilirubin 1.3 (*)    GFR, Estimated 58 (*)    All other components within normal limits  URINALYSIS, ROUTINE W REFLEX MICROSCOPIC - Abnormal;  Notable for the following components:   Color, Urine AMBER (*)    APPearance HAZY (*)    Bilirubin Urine SMALL (*)    Ketones, ur 20 (*)    Protein, ur 30 (*)    All other components within normal limits  LACTIC ACID, PLASMA  CBG MONITORING, ED  TROPONIN I (HIGH SENSITIVITY)  TROPONIN I (HIGH SENSITIVITY)    EKG EKG Interpretation Date/Time:  Monday May 12 2023 09:28:01 EST Ventricular Rate:  80 PR Interval:  166 QRS Duration:  96 QT Interval:  370 QTC Calculation: 427 R Axis:   -51  Text Interpretation: Sinus rhythm Ventricular premature complex Left anterior fascicular block Borderline T wave abnormalities No acute changes No  significant change since last tracing Confirmed by Charlyn Sora ([id number]) on 05/12/2023 1:07:30 PM  Radiology EEG adult Result Date: 05/12/2023 Shelton Arlin KIDD, MD     05/12/2023  3:13 PM Patient Name: Jeff Wilson MRN: [id number] Epilepsy Attending: Arlin KIDD Shelton Referring Physician/Provider: Michaela Aisha SQUIBB, MD Date: 05/12/2023 Duration: 22.53 mins Patient history: 83yo M with ams getting eeg to evaluate for seizure Level of alertness: Awake AEDs during EEG study: Technical aspects: This EEG study was done with scalp electrodes positioned according to the 10-20 International system of electrode placement. Electrical activity was reviewed with band pass filter of 1-70Hz , sensitivity of 7 uV/mm, display speed of 43mm/sec with a 60Hz  notched filter applied as appropriate. EEG data were recorded continuously and digitally stored.  Video monitoring was available and reviewed as appropriate. Description: The posterior dominant rhythm consists of 8 Hz activity of moderate voltage (25-35 uV) seen predominantly in posterior head regions, symmetric and reactive to eye opening and eye closing. Hyperventilation and photic stimulation were not performed.   IMPRESSION: This study is within normal limits. No seizures or epileptiform discharges were seen throughout the recording. A normal interictal EEG does not exclude the diagnosis of epilepsy. Jeff Wilson   CT Head Wo Contrast Result Date: 05/12/2023 CLINICAL DATA:  Seizure, new-onset, no history of trauma EXAM: CT HEAD WITHOUT CONTRAST TECHNIQUE: Contiguous axial images were obtained from the base of the skull through the vertex without intravenous contrast. RADIATION DOSE REDUCTION: This exam was performed according to the departmental dose-optimization program which includes automated exposure control, adjustment of the mA and/or kV according to patient size and/or use of iterative reconstruction technique. COMPARISON:  None Available. FINDINGS: Brain:  No evidence of acute infarction, hemorrhage, hydrocephalus, extra-axial collection or mass lesion/mass effect. Patchy white matter hypodensities are nonspecific but compatible with chronic microvascular ischemic change. 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 Vascular: No hyperdense vessel or unexpected calcification. Skull: No acute fracture. Sinuses/Orbits: Clear sinuses.  No acute orbital findings. Other: No mastoid effusions. IMPRESSION: 1. No evidence of acute intracranial abnormality. 2. 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 Electronically Signed   By: Gilmore GORMAN Molt M.D.   On: 05/12/2023 13:01    Procedures Procedures    Medications Ordered in ED Medications  levETIRAcetam  (KEPPRA ) IVPB 1000 mg/100 mL premix (0 mg Intravenous Stopped 05/12/23 1613)    ED Course/ Medical Decision Making/ A&P                                 Medical Decision Making Amount and/or Complexity of Data Reviewed Labs: ordered. Radiology: ordered.  Risk Prescription drug management.   This patient presents to the ED with chief complaint(s) of  unresponsiveness with pertinent past medical history of dementia, stroke.The complaint involves an extensive differential diagnosis and also carries with it a high risk of complications and morbidity.    The differential diagnosis includes : TIA, absence seizure, arrhythmia, electrolyte abnormality, medication side effects  The initial plan is to get basic labs.  CT scan of the brain also ordered.  Neurology consulted to see if we can do EEG stat. Currently patient is at baseline.   Additional history obtained: Additional history obtained from nursing home/care facility Records reviewed previous admission documents  Independent labs interpretation:  The following labs were independently interpreted: CBC, metabolic profile normal.  Independent visualization and interpretation of imaging: - I independently  visualized the following imaging with scope of interpretation limited to determining acute life threatening conditions related to emergency care: CT scan of the brain, which revealed no evidence of brain bleed.  Consultation: - Consulted or discussed management/test interpretation with external professional: Discussed case with Dr. Lenora Seals, neurology.  They did recommend we do EEG stat.  Dr. Lenora Seals also thinks that patient likely did have a seizure, he recalls seeing the patient in December as well.  He recommends that the Keppra  be increased to 500 mg twice daily.  Consideration for admission or further workup: Admission was considered, however patient has known history of seizure, likely had a postictal phase or seizure at the nursing home since he is back to baseline normal.  He has not had any cardiac abnormality noted in our telemetry.  We do not have any concerning lab workup finding.  And he resides at nursing home where he can at least get some monitoring.  They were comfortable discharging the patient with return precautions.   Final Clinical Impression(s) / ED Diagnoses Final diagnoses:  Seizure-like activity (HCC)    Rx / DC Orders ED Discharge Orders          Ordered    levETIRAcetam  (KEPPRA ) 250 MG tablet  2 times daily        05/12/23 1507              Charlyn Sora, MD 05/12/23 2200

## 2023-05-12 NOTE — ED Provider Notes (Signed)
 Care taken over from Dr. Charlyn pending repeat troponin and urinalysis.  These results are nonconcerning.  Patient is awake and reportedly is baseline mental status.  Care has been discussed by Dr. Charlyn with neurology who recommended increasing the patient's antiseizure medications.  Patient will be discharged back to his nursing facility.   Lenor Hollering, MD 05/12/23 2135

## 2023-06-02 ENCOUNTER — Emergency Department (HOSPITAL_COMMUNITY): Payer: Medicare HMO

## 2023-06-02 ENCOUNTER — Inpatient Hospital Stay (HOSPITAL_COMMUNITY)
Admission: EM | Admit: 2023-06-02 | Discharge: 2023-06-04 | DRG: 064 | Disposition: A | Discharge status: Hospice | Payer: Medicare HMO | Source: Skilled Nursing Facility | Attending: Internal Medicine | Admitting: Internal Medicine

## 2023-06-02 DIAGNOSIS — R4701 Aphasia: Secondary | ICD-10-CM | POA: Diagnosis present

## 2023-06-02 DIAGNOSIS — I619 Nontraumatic intracerebral hemorrhage, unspecified: Secondary | ICD-10-CM | POA: Diagnosis present

## 2023-06-02 DIAGNOSIS — Z1152 Encounter for screening for COVID-19: Secondary | ICD-10-CM | POA: Diagnosis not present

## 2023-06-02 DIAGNOSIS — Z7189 Other specified counseling: Secondary | ICD-10-CM | POA: Diagnosis not present

## 2023-06-02 DIAGNOSIS — I10 Essential (primary) hypertension: Secondary | ICD-10-CM | POA: Diagnosis present

## 2023-06-02 DIAGNOSIS — R64 Cachexia: Secondary | ICD-10-CM | POA: Diagnosis present

## 2023-06-02 DIAGNOSIS — Z66 Do not resuscitate: Secondary | ICD-10-CM | POA: Diagnosis present

## 2023-06-02 DIAGNOSIS — K5900 Constipation, unspecified: Secondary | ICD-10-CM | POA: Diagnosis present

## 2023-06-02 DIAGNOSIS — Z8669 Personal history of other diseases of the nervous system and sense organs: Secondary | ICD-10-CM

## 2023-06-02 DIAGNOSIS — K861 Other chronic pancreatitis: Secondary | ICD-10-CM | POA: Diagnosis present

## 2023-06-02 DIAGNOSIS — Z87891 Personal history of nicotine dependence: Secondary | ICD-10-CM

## 2023-06-02 DIAGNOSIS — E782 Mixed hyperlipidemia: Secondary | ICD-10-CM | POA: Diagnosis present

## 2023-06-02 DIAGNOSIS — G936 Cerebral edema: Secondary | ICD-10-CM | POA: Diagnosis present

## 2023-06-02 DIAGNOSIS — G40909 Epilepsy, unspecified, not intractable, without status epilepticus: Secondary | ICD-10-CM | POA: Diagnosis present

## 2023-06-02 DIAGNOSIS — Z515 Encounter for palliative care: Secondary | ICD-10-CM | POA: Diagnosis not present

## 2023-06-02 DIAGNOSIS — G9341 Metabolic encephalopathy: Secondary | ICD-10-CM | POA: Diagnosis present

## 2023-06-02 DIAGNOSIS — I63412 Cerebral infarction due to embolism of left middle cerebral artery: Secondary | ICD-10-CM | POA: Diagnosis present

## 2023-06-02 DIAGNOSIS — G8191 Hemiplegia, unspecified affecting right dominant side: Secondary | ICD-10-CM | POA: Diagnosis present

## 2023-06-02 DIAGNOSIS — E872 Acidosis, unspecified: Secondary | ICD-10-CM | POA: Diagnosis present

## 2023-06-02 DIAGNOSIS — I4891 Unspecified atrial fibrillation: Secondary | ICD-10-CM | POA: Diagnosis present

## 2023-06-02 DIAGNOSIS — R29730 NIHSS score 30: Secondary | ICD-10-CM | POA: Diagnosis present

## 2023-06-02 DIAGNOSIS — R29810 Facial weakness: Secondary | ICD-10-CM | POA: Diagnosis present

## 2023-06-02 DIAGNOSIS — F0390 Unspecified dementia without behavioral disturbance: Secondary | ICD-10-CM | POA: Diagnosis present

## 2023-06-02 DIAGNOSIS — I6522 Occlusion and stenosis of left carotid artery: Secondary | ICD-10-CM | POA: Diagnosis present

## 2023-06-02 DIAGNOSIS — R7989 Other specified abnormal findings of blood chemistry: Secondary | ICD-10-CM | POA: Diagnosis present

## 2023-06-02 DIAGNOSIS — I820 Budd-Chiari syndrome: Secondary | ICD-10-CM | POA: Diagnosis present

## 2023-06-02 DIAGNOSIS — I639 Cerebral infarction, unspecified: Secondary | ICD-10-CM | POA: Diagnosis present

## 2023-06-02 DIAGNOSIS — Z79899 Other long term (current) drug therapy: Secondary | ICD-10-CM

## 2023-06-02 DIAGNOSIS — K5289 Other specified noninfective gastroenteritis and colitis: Secondary | ICD-10-CM | POA: Diagnosis present

## 2023-06-02 DIAGNOSIS — I63512 Cerebral infarction due to unspecified occlusion or stenosis of left middle cerebral artery: Secondary | ICD-10-CM | POA: Diagnosis not present

## 2023-06-02 DIAGNOSIS — Z8673 Personal history of transient ischemic attack (TIA), and cerebral infarction without residual deficits: Secondary | ICD-10-CM

## 2023-06-02 DIAGNOSIS — Z7902 Long term (current) use of antithrombotics/antiplatelets: Secondary | ICD-10-CM

## 2023-06-02 LAB — CBC
HCT: 43.4 % (ref 39.0–52.0)
Hemoglobin: 14 g/dL (ref 13.0–17.0)
MCH: 27.6 pg (ref 26.0–34.0)
MCHC: 32.3 g/dL (ref 30.0–36.0)
MCV: 85.4 fL (ref 80.0–100.0)
Platelets: 231 10*3/uL (ref 150–400)
RBC: 5.08 MIL/uL (ref 4.22–5.81)
RDW: 15 % (ref 11.5–15.5)
WBC: 12.5 10*3/uL — ABNORMAL HIGH (ref 4.0–10.5)
nRBC: 0 % (ref 0.0–0.2)

## 2023-06-02 LAB — RESP PANEL BY RT-PCR (RSV, FLU A&B, COVID)  RVPGX2
Influenza A by PCR: NEGATIVE
Influenza B by PCR: NEGATIVE
Resp Syncytial Virus by PCR: NEGATIVE
SARS Coronavirus 2 by RT PCR: NEGATIVE

## 2023-06-02 LAB — PROTIME-INR
INR: 1.2 (ref 0.8–1.2)
Prothrombin Time: 15.5 s — ABNORMAL HIGH (ref 11.4–15.2)

## 2023-06-02 LAB — BASIC METABOLIC PANEL
Anion gap: 14 (ref 5–15)
BUN: 18 mg/dL (ref 8–23)
CO2: 25 mmol/L (ref 22–32)
Calcium: 9.6 mg/dL (ref 8.9–10.3)
Chloride: 103 mmol/L (ref 98–111)
Creatinine, Ser: 0.91 mg/dL (ref 0.61–1.24)
GFR, Estimated: >60 mL/min (ref >60)
Glucose, Bld: 108 mg/dL — ABNORMAL HIGH (ref 70–99)
Potassium: 3.6 mmol/L (ref 3.5–5.1)
Sodium: 142 mmol/L (ref 135–145)

## 2023-06-02 LAB — I-STAT CG4 LACTIC ACID, ED: Lactic Acid, Venous: 4.6 mmol/L (ref 0.5–1.9)

## 2023-06-02 LAB — APTT: aPTT: 29 s (ref 24–36)

## 2023-06-02 LAB — TROPONIN I (HIGH SENSITIVITY)
Troponin I (High Sensitivity): 773 ng/L (ref <18)
Troponin I (High Sensitivity): 878 ng/L (ref <18)

## 2023-06-02 LAB — BRAIN NATRIURETIC PEPTIDE: B Natriuretic Peptide: 84.2 pg/mL (ref 0.0–100.0)

## 2023-06-02 LAB — AMMONIA: Ammonia: 27 umol/L (ref 9–35)

## 2023-06-02 MED ORDER — SODIUM CHLORIDE 0.9 % IV BOLUS
500.0000 mL | Freq: Once | INTRAVENOUS | Status: AC
  Administered 2023-06-02: 500 mL via INTRAVENOUS

## 2023-06-02 MED ORDER — ASPIRIN 325 MG PO TABS: 325.0000 mg | ORAL_TABLET | Freq: Every day | ORAL | Status: DC

## 2023-06-02 MED ORDER — IOHEXOL 350 MG/ML SOLN
75.0000 mL | Freq: Once | INTRAVENOUS | Status: AC | PRN
  Administered 2023-06-02: 75 mL via INTRAVENOUS

## 2023-06-02 NOTE — H&P (Signed)
Jeff Wilson ZOX:[id number] DOB: Sep 25, 1940 DOA: 06/02/2023     PCP: [name], MD   Patient arrived to ER on 06/02/23 at 1720 Referred by Attending Wynetta Fines, MD   Patient coming from:    home Lives alone,   From facility [facility]    Chief Complaint: altered mental status   HPI: Jeff Wilson is a 83 y.o. 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 Neurology was consulted patient not a candidate for tPA given completed stroke Prior history of TIA in the past    Denies significant ETOH intake   Does not smoke    Lab Results  Component Value Date   SARSCOV2NAA NEGATIVE 06/02/2023   SARSCOV2NAA NEGATIVE 02/19/2023   SARSCOV2NAA NEGATIVE 05/22/2020        Regarding pertinent Chronic problems:     Hyperlipidemia -  on statins Lipitor (atorvastatin)      HTN on norvasc       Hx of CVA - *with/out residual deficits on Aspirin 81 mg  Plavix   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 Wasn't responding to verbal stimuli. CNA gave him whole bed bath and patient "didn't even flinch" so facility provider recommended observation, low concern because patient was found to in similar state on 1/13. Seizure last time. Compliant on seizure meds. LKW unsure. ` [CG]  2203 Discussed with Dr. Granville Lewis who states nothing to do right now. Will follow along and consult PRN [CG]    Clinical Course User Index [CG] Al Decant, PA-C     Lab  Orders         Resp panel by RT-PCR (RSV, Flu A&B, Covid) Anterior Nasal Swab         Blood culture (routine x 2)         CBC         Basic metabolic panel         Brain natriuretic peptide         Urinalysis, Routine w reflex microscopic -Urine, Clean Catch         Ammonia         Protime-INR         APTT         I-Stat CG4 Lactic Acid      CT HEAD Large evolving left MCA territory infarct, subacute in appearance. Associated regional mass effect with 11-12 mm of right-to-left shift. 2. Acute extra-axial hemorrhage overlying the left cerebral convexity measuring up to 7 mm in thickness. 3. Hyperdense left M1 segment, concerning for occlusion. Correlation with dedicated CTA recommended. 4. Underlying atrophy with chronic small vessel ischemic disease, with chronic infarcts about the right basal ganglia and right thalamus.   Associated regional mass effect with 11 to 12 mm of right-to-left shift.   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 Attenuated perfusion of the left MCA  via collateral flow across the circle-of-Willis. Probable subocclusive thrombus within the left M1 segment.   Layering secretions within the subglottic airway, suggesting that this patient is at risk for aspiration.   CXR -  NON acute  CTabd/pelvis - small nonocclusive thrombus in the proximal right hepatic vein  CTA chest -   no PE, Segmental and subsegmental mucous plugging in the posterior basal lower lobes with mild subsegmental atelectasis. Trace pleural effusions.  Large stool accumulation in the distal sigmoid colon and rectum with slight perirectal fat stranding, which could be from dependent congestive changes or stercoral proctitis.  Mild bladder thickening   . Chronic calcific pancreatitis.   Following Medications were ordered in ER: Medications  sodium chloride 0.9 % bolus 500 mL (500 mLs Intravenous New Bag/Given 06/02/23  1805)  iohexol (OMNIPAQUE) 350 MG/ML injection 75 mL (75 mLs Intravenous Contrast Given 06/02/23 2027)    _______________________________________________________ ER Provider Called:     Neurology   Dr.Arora They Recommend admit to medicine      Consider Palliative care consult     ED Triage Vitals  Encounter Vitals Group     BP 06/02/23 1741 (!) 125/96     Systolic BP Percentile --      Diastolic BP Percentile --      Pulse Rate 06/02/23 1807 (!) 106     Resp 06/02/23 1807 18     Temp 06/02/23 1807 99.9 F (37.7 C)     Temp Source 06/02/23 1807 Rectal     SpO2 06/02/23 1807 97 %     Weight --      Height --      Head Circumference --      Peak Flow --      Pain Score --      Pain Loc --      Pain Education --      Exclude from Growth Chart --   EAVW(09)@     _________________________________________ Significant initial  Findings: Abnormal Labs Reviewed  CBC - Abnormal; Notable for the following components:      Result Value   WBC 12.5 (*)    All other components within normal limits  BASIC METABOLIC PANEL - Abnormal; Notable for the following components:   Glucose, Bld 108 (*)    All other components within normal limits  PROTIME-INR - Abnormal; Notable for the following components:   Prothrombin Time 15.5 (*)    All other components within normal limits  I-STAT CG4 LACTIC ACID, ED - Abnormal; Notable for the following components:   Lactic Acid, Venous 4.6 (*)    All other components within normal limits  TROPONIN I (HIGH SENSITIVITY) - Abnormal; Notable for the following components:   Troponin I (High Sensitivity) 773 (*)    All other components within normal limits  TROPONIN I (HIGH SENSITIVITY) - Abnormal; Notable for the following components:   Troponin I (High Sensitivity) 878 (*)    All other components within normal limits  _________________________ Troponin  Cardiac Panel (last 3 results) Recent Labs    06/02/23 1810 06/02/23 2045  TROPONINIHS 773*  878*     ECG: Ordered Personally reviewed and interpreted by me showing: HR : 133 Rhythm: Sinus tachycardia Ventricular premature complex Inferior infarct, old Probable anterior infarct, old QTC 476  BNP (last 3 results) Recent Labs    06/02/23 1810  BNP 84.2     COVID-19 Labs  No results for input(s): "DDIMER", "FERRITIN", "LDH", "CRP" in the last 72 hours.  Lab Results  Component Value Date   SARSCOV2NAA NEGATIVE 06/02/2023   SARSCOV2NAA NEGATIVE 02/19/2023   SARSCOV2NAA NEGATIVE 05/22/2020   ____________________ This patient meets SIRS Criteria and may be septic. The recent clinical data is shown below. Vitals:   06/02/23 1930 06/02/23 1945 06/02/23 2200 06/02/23 2212  BP: 123/75 118/84 115/80   Pulse: 92 83 99   Resp: 12 20 19 18   Temp:      TempSrc:      SpO2: 98% 99% 96%    WBC     Component Value Date/Time   WBC 12.5 (H) 06/02/2023 1810   LYMPHSABS 1.3 05/12/2023 1025   MONOABS 0.5 05/12/2023 1025   EOSABS 0.0 05/12/2023 1025   BASOSABS 0.0 05/12/2023 1025    Lactic Acid, Venous    Component Value Date/Time   LATICACIDVEN 4.6 (HH) 06/02/2023 2047       UA ordered   Results for orders placed or performed during the hospital encounter of 06/02/23  Resp panel by RT-PCR (RSV, Flu A&B, Covid) Anterior Nasal Swab     Status: None   Collection Time: 06/02/23  6:10 PM   Specimen: Anterior Nasal Swab  Result Value Ref Range Status   SARS Coronavirus 2 by RT PCR NEGATIVE NEGATIVE Final   Influenza A by PCR NEGATIVE NEGATIVE Final   Influenza B by PCR NEGATIVE NEGATIVE Final         Resp Syncytial Virus by PCR NEGATIVE NEGATIVE Final        Blood culture (routine x 2)     Status: None (Preliminary result)   Collection Time: 06/02/23  8:45 PM   Specimen: BLOOD RIGHT WRIST  Result Value Ref Range Status   Specimen Description BLOOD RIGHT WRIST  Final   Special Requests   Final    BOTTLES DRAWN AEROBIC AND ANAEROBIC Blood Culture results may not  be optimal due to an inadequate volume of blood received in culture bottles Performed at [facility], [address], [place], [place] [address]    Culture PENDING  Incomplete   Report Status PENDING  Incomplete     ____________________________________________________________________ Recent Labs  Lab 06/02/23 1810  NA 142  K 3.6  CO2 25  GLUCOSE 108*  BUN 18  CREATININE 0.91  CALCIUM 9.6    Cr   stable,  Lab Results  Component Value Date   CREATININE 0.91 06/02/2023   CREATININE 1.26 (H) 05/12/2023   CREATININE 0.92 04/10/2023    No results for input(s): "AST", "ALT", "ALKPHOS", "BILITOT", "PROT", "ALBUMIN" in the last 168 hours. Lab Results  Component Value Date   CALCIUM 9.6 06/02/2023   PHOS 2.3 (L) 02/21/2023          Plt: Lab Results  Component Value Date   PLT 231 06/02/2023     Recent Labs  Lab 06/02/23 1810  WBC 12.5*  HGB 14.0  HCT 43.4  MCV 85.4  PLT 231    HG/HCT  stable,     Component Value Date/Time   HGB 14.0 06/02/2023 1810   HCT 43.4 06/02/2023 1810   MCV 85.4 06/02/2023 1810   No results for input(s): "LIPASE", "AMYLASE" in the last 168 hours. Recent Labs  Lab 06/02/23 2045  AMMONIA 27    _______________________________________________ Hospitalist was called for admission for CVA and hemorrhagic conversion   The following Work up has been ordered so far:  Orders Placed This Encounter  Procedures   Resp panel by RT-PCR (RSV, Flu A&B, Covid)  Anterior Nasal Swab   Blood culture (routine x 2)   CT Head Wo Contrast   DG Chest Portable 1 View   CT Angio Chest PE W and/or Wo Contrast   CT ABDOMEN PELVIS W CONTRAST   CT ANGIO HEAD NECK W WO CM   CBC   Basic metabolic panel   Brain natriuretic peptide   Urinalysis, Routine w reflex microscopic -Urine, Clean Catch   Ammonia   Protime-INR   APTT   Insert foley catheter   Inpatient consult to Cardiology   Consult to hospitalist   I-Stat CG4 Lactic Acid   ED EKG    EKG 12-Lead   EKG 12-Lead     OTHER Significant initial  Findings:  labs showing:     DM  labs:  HbA1C: No results for input(s): "HGBA1C" in the last 8760 hours.     CBG (last 3)  No results for input(s): "GLUCAP" in the last 72 hours.        Cultures:    Component Value Date/Time   SDES BLOOD RIGHT WRIST 06/02/2023 2045   SPECREQUEST  06/02/2023 2045    BOTTLES DRAWN AEROBIC AND ANAEROBIC Blood Culture results may not be optimal due to an inadequate volume of blood received in culture bottles Performed at [facility], [address] [place], [place] [address]    CULT PENDING 06/02/2023 2045   REPTSTATUS PENDING 06/02/2023 2045     Radiological Exams on Admission: CT Angio Chest PE W and/or Wo Contrast Result Date: 06/02/2023 CLINICAL DATA:  Altered mental status and sepsis, high probability of pulmonary embolism. EXAM: CT ANGIOGRAPHY CHEST CT ABDOMEN AND PELVIS WITH CONTRAST TECHNIQUE: Multidetector CT imaging of the chest was performed using the standard protocol during bolus administration of intravenous contrast. Multiplanar CT image reconstructions and MIPs were obtained to evaluate the vascular anatomy. Multidetector CT imaging of the abdomen and pelvis was performed using the standard protocol during bolus administration of intravenous contrast. RADIATION DOSE REDUCTION: This exam was performed according to the departmental dose-optimization program which includes automated exposure control, adjustment of the mA and/or kV according to patient size and/or use of iterative reconstruction technique. CONTRAST:  75mL OMNIPAQUE IOHEXOL 350 MG/ML SOLN COMPARISON:  Portable chest today, AP Lat chest 04/10/2023, CT abdomen and pelvis with contrast 12/10/2021, and the report of a CTA chest 02/28/2008, images for which are no longer available in PACS. FINDINGS: CTA CHEST FINDINGS Cardiovascular: The pulmonary arteries are normal in caliber. No arterial embolus is seen through the  segmental divisions. The subsegmental arterial bed is largely not evaluated due to obscuration by respiratory motion. The cardiac size is normal. There is no pericardial effusion. There trace single-vessel calcifications in the LAD coronary artery. There is mild aortic atherosclerosis and tortuosity. Aortic opacification just insufficient to assess for dissection. Pulmonary veins are nondistended. Mediastinum/Nodes: No enlarged mediastinal, hilar, or axillary lymph nodes. Thyroid gland and esophagus demonstrate no significant findings. There are retained secretions in the proximal trachea. Both main bronchi are clear. Lungs/Pleura: Segmental and subsegmental mucous plugging noted in the posterior basal lower lobes, mild subsegmental atelectasis in the posterior lower lobes. There are trace pleural effusions. No consolidation or nodules are seen through the breathing motion. Musculoskeletal: No chest wall mass is seen. 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 Mild thoracic kyphosis. No acute or significant osseous findings. Review of the MIP images confirms the above findings. CT ABDOMEN and PELVIS FINDINGS Hepatobiliary: Stable 5 mm too small  to characterize hypodensity in the dome of segment 8, probably a small cyst. The liver parenchyma is otherwise unremarkable. Gallbladder and bile ducts are unremarkable. There is suspicion of a small nonocclusive thrombus in the proximal right hepatic vein on 4: 12-14 and 9:78. Rest of the hepatic veins and the portal venous structures opacify normally. Pancreas: There are occasional pancreatic parenchymal calcifications consistent with chronic calcific pancreatitis. No acute pancreatic inflammatory changes or mass enhancement are seen, no ductal dilatation. Spleen: No abnormality. Adrenals/Urinary Tract: There is no adrenal mass. Right kidney is unremarkable. There is a stable 1 cm Bosniak 1 cyst in the superior pole of the left kidney  medially, Hounsfield density is 9. There is a stable 7 mm too small to characterize Bosniak 2 cyst in the outer midpole left kidney. There is no mass enhancement. There is no urinary stone or obstruction. Both kidneys excrete symmetrically on the delayed images. There is impression on the bladder by the enlarged prostate. There is mild bladder thickening versus underdistention or hypertrophy. Correlate with urinalysis for possible significance. Stomach/Bowel: Unremarkable stomach and unopacified small bowel. Normal caliber appendix. Scattered sigmoid diverticula without evidence of diverticulitis. Large stool accumulation distal sigmoid colon and rectum is again noted and slight perirectal fat stranding which could be from dependent congestive changes or stercoral proctitis. There is no overt wall thickening or pneumatosis. Vascular/Lymphatic: As above there is suspicion for a small nonocclusive thrombus in the right hepatic vein. There is mild aortic atherosclerosis without AAA or dissection. No other significant vascular findings. No lymphadenopathy is seen. Reproductive: Moderate to severe prostatomegaly, transverse axis 6.2 cm, purposes 6.4 cm, with bladder impression. Other: Small umbilical and left inguinal fat hernias. No incarcerated hernia. There is trace presacral ascites. There is no free hemorrhage, free air, or abscess. Musculoskeletal: There is disc collapse and interbody and facet joint ankylosis at L5-S1, multilevel lumbar bridging osteophytes. Advanced facet hypertrophy L3-4 and L4-5, with acquired spinal stenosis L4-5. There is ankylosis across the right SI joint. Mild hip DJD. Review of the MIP images confirms the above findings. IMPRESSION: 1. No evidence of pulmonary arterial embolus through the segmental divisions. The subsegmental arterial bed is largely not evaluated due to obscuration by respiratory motion. 2. Segmental and subsegmental mucous plugging in the posterior basal lower lobes with  mild subsegmental atelectasis. Trace pleural effusions. 3. Aortic and coronary artery atherosclerosis. 4. Suspicion of a small nonocclusive thrombus in the proximal right hepatic vein. 5. Large stool accumulation in the distal sigmoid colon and rectum with slight perirectal fat stranding, which could be from dependent congestive changes or stercoral proctitis. 6. Diverticulosis without evidence of diverticulitis. 7. Moderate to severe prostatomegaly with bladder impression. 8. Mild bladder thickening versus underdistention or hypertrophy. Correlate with urinalysis for possible significance. 9. Chronic calcific pancreatitis. No evidence of acute pancreatitis. 10. Umbilical and left inguinal fat hernias. Degenerative and DISH changes. Aortic Atherosclerosis (ICD10-I70.0). Electronically Signed   By: Almira Bar M.D.   On: 06/02/2023 21:22   CT ABDOMEN PELVIS W CONTRAST Result Date: 06/02/2023 CLINICAL DATA:  Altered mental status and sepsis, high probability of pulmonary embolism. EXAM: CT ANGIOGRAPHY CHEST CT ABDOMEN AND PELVIS WITH CONTRAST TECHNIQUE: Multidetector CT imaging of the chest was performed using the standard protocol during bolus administration of intravenous contrast. Multiplanar CT image reconstructions and MIPs were obtained to evaluate the vascular anatomy. Multidetector CT imaging of the abdomen and pelvis was performed using the standard protocol during bolus administration of intravenous contrast. RADIATION DOSE REDUCTION: This  exam was performed according to the departmental dose-optimization program which includes automated exposure control, adjustment of the mA and/or kV according to patient size and/or use of iterative reconstruction technique. CONTRAST:  75mL OMNIPAQUE IOHEXOL 350 MG/ML SOLN COMPARISON:  Portable chest today, AP Lat chest 04/10/2023, CT abdomen and pelvis with contrast 12/10/2021, and the report of a CTA chest 02/28/2008, images for which are no longer available in  PACS. FINDINGS: CTA CHEST FINDINGS Cardiovascular: The pulmonary arteries are normal in caliber. No arterial embolus is seen through the segmental divisions. The subsegmental arterial bed is largely not evaluated due to obscuration by respiratory motion. The cardiac size is normal. There is no pericardial effusion. There trace single-vessel calcifications in the LAD coronary artery. There is mild aortic atherosclerosis and tortuosity. Aortic opacification just insufficient to assess for dissection. Pulmonary veins are nondistended. Mediastinum/Nodes: No enlarged mediastinal, hilar, or axillary lymph nodes. Thyroid gland and esophagus demonstrate no significant findings. There are retained secretions in the proximal trachea. Both main bronchi are clear. Lungs/Pleura: Segmental and subsegmental mucous plugging noted in the posterior basal lower lobes, mild subsegmental atelectasis in the posterior lower lobes. There are trace pleural effusions. No consolidation or nodules are seen through the breathing motion. Musculoskeletal: No chest wall mass is seen. 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 Mild thoracic kyphosis. No acute or significant osseous findings. Review of the MIP images confirms the above findings. CT ABDOMEN and PELVIS FINDINGS Hepatobiliary: Stable 5 mm too small to characterize hypodensity in the dome of segment 8, probably a small cyst. The liver parenchyma is otherwise unremarkable. Gallbladder and bile ducts are unremarkable. There is suspicion of a small nonocclusive thrombus in the proximal right hepatic vein on 4: 12-14 and 9:78. Rest of the hepatic veins and the portal venous structures opacify normally. Pancreas: There are occasional pancreatic parenchymal calcifications consistent with chronic calcific pancreatitis. No acute pancreatic inflammatory changes or mass enhancement are seen, no ductal dilatation. Spleen: No abnormality. Adrenals/Urinary Tract:  There is no adrenal mass. Right kidney is unremarkable. There is a stable 1 cm Bosniak 1 cyst in the superior pole of the left kidney medially, Hounsfield density is 9. There is a stable 7 mm too small to characterize Bosniak 2 cyst in the outer midpole left kidney. There is no mass enhancement. There is no urinary stone or obstruction. Both kidneys excrete symmetrically on the delayed images. There is impression on the bladder by the enlarged prostate. There is mild bladder thickening versus underdistention or hypertrophy. Correlate with urinalysis for possible significance. Stomach/Bowel: Unremarkable stomach and unopacified small bowel. Normal caliber appendix. Scattered sigmoid diverticula without evidence of diverticulitis. Large stool accumulation distal sigmoid colon and rectum is again noted and slight perirectal fat stranding which could be from dependent congestive changes or stercoral proctitis. There is no overt wall thickening or pneumatosis. Vascular/Lymphatic: As above there is suspicion for a small nonocclusive thrombus in the right hepatic vein. There is mild aortic atherosclerosis without AAA or dissection. No other significant vascular findings. No lymphadenopathy is seen. Reproductive: Moderate to severe prostatomegaly, transverse axis 6.2 cm, purposes 6.4 cm, with bladder impression. Other: Small umbilical and left inguinal fat hernias. No incarcerated hernia. There is trace presacral ascites. There is no free hemorrhage, free air, or abscess. Musculoskeletal: There is disc collapse and interbody and facet joint ankylosis at L5-S1, multilevel lumbar bridging osteophytes. Advanced facet hypertrophy L3-4 and L4-5, with acquired spinal stenosis L4-5. There is ankylosis across the right SI joint.  Mild hip DJD. Review of the MIP images confirms the above findings. IMPRESSION: 1. No evidence of pulmonary arterial embolus through the segmental divisions. The subsegmental arterial bed is largely not  evaluated due to obscuration by respiratory motion. 2. Segmental and subsegmental mucous plugging in the posterior basal lower lobes with mild subsegmental atelectasis. Trace pleural effusions. 3. Aortic and coronary artery atherosclerosis. 4. Suspicion of a small nonocclusive thrombus in the proximal right hepatic vein. 5. Large stool accumulation in the distal sigmoid colon and rectum with slight perirectal fat stranding, which could be from dependent congestive changes or stercoral proctitis. 6. Diverticulosis without evidence of diverticulitis. 7. Moderate to severe prostatomegaly with bladder impression. 8. Mild bladder thickening versus underdistention or hypertrophy. Correlate with urinalysis for possible significance. 9. Chronic calcific pancreatitis. No evidence of acute pancreatitis. 10. Umbilical and left inguinal fat hernias. Degenerative and DISH changes. Aortic Atherosclerosis (ICD10-I70.0). Electronically Signed   By: Almira Bar M.D.   On: 06/02/2023 21:22   CT ANGIO HEAD NECK W WO CM Result Date: 06/02/2023 CLINICAL DATA:  Initial evaluation for acute stroke. EXAM: CT ANGIOGRAPHY HEAD AND NECK WITH AND WITHOUT CONTRAST TECHNIQUE: Multidetector CT imaging of the head and neck was performed using the standard protocol during bolus administration of intravenous contrast. Multiplanar CT image reconstructions and MIPs were obtained to evaluate the vascular anatomy. Carotid stenosis measurements (when applicable) are obtained utilizing NASCET criteria, using the distal internal carotid diameter as the denominator. RADIATION DOSE REDUCTION: This exam was performed according to the departmental dose-optimization program which includes automated exposure control, adjustment of the mA and/or kV according to patient size and/or use of iterative reconstruction technique. CONTRAST:  75mL OMNIPAQUE IOHEXOL 350 MG/ML SOLN COMPARISON:  Prior CT from earlier the same day. FINDINGS: CTA NECK FINDINGS Aortic  arch: Visualized aortic arch within normal limits for caliber. Bovine branching pattern noted. Mild aortic atherosclerosis. No significant stenosis about the origin of the great vessels. Right carotid system: Right common and internal carotid arteries are patent without dissection. Mild atheromatous change about the right carotid bulb without hemodynamically significant greater than 50% stenosis. Left carotid system: Left CCA patent from its origin to the bifurcation. 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 Left ICA remains occluded to the skull base. Vertebral arteries: Both vertebral arteries arise from subclavian arteries. No proximal subclavian artery stenosis. Right vertebral artery slightly dominant. Vertebral arteries are patent without stenosis or dissection. Skeleton: No discrete or worrisome osseous lesions. Moderate spondylosis at C3-4 through C5-6. Other neck: No other acute finding. Upper chest: Layering secretions noted within the subglottic airway, suggesting at this patient is at risk for aspiration. No other acute finding. Visualized lungs are clear. Review of the MIP images confirms the above findings CTA HEAD FINDINGS Anterior circulation: The right ICA patent to the terminus without significant stenosis or other abnormality. Left ICA remains occluded to the terminus. A1 segments patent, with filling of the left A1 via collateral flow across the circle-of-Willis. Normal anterior communicating artery complex. Both ACAs patent without significant stenosis. Right M1 segment and its distal branches are patent and well perfused. Collateral filling of the left M1 segment the of the circle-of-Willis and possibly the left PCOM. Probable subocclusive thrombus within the left M1 segment (series 7, images 91, 92). Some perfusion seen within the left MCA branches distally. Posterior circulation: Both V4 segments patent without significant stenosis. Left  PICA patent. Right PICA not well seen. Basilar diminutive  but patent without stenosis. Superior cerebral arteries patent bilaterally. Predominant fetal type origin of the PCAs bilaterally, with partial occlusion of the proximal left PCOM (series 6, image 341). Left PCA patent distally and perfused to its distal aspect. Right PCA widely patent without stenosis. Venous sinuses: Patent allowing for timing the contrast bolus. Anatomic variants: As above.  No visible aneurysm. Review of the MIP images confirms the above findings IMPRESSION: 1. 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 Attenuated perfusion of the left MCA via collateral flow across the circle-of-Willis. Probable subocclusive thrombus within the left M1 segment. 2. 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 Left PCA patent and perfused distally. 3. Mild atheromatous change about the right carotid bulb without hemodynamically significant stenosis. 4. Layering secretions within the subglottic airway, suggesting that this patient is at risk for aspiration. Aortic Atherosclerosis (ICD10-I70.0). These results were communicated to Dr. Wilford Corner at 8:18 pm on 06/02/2023 by text page via the [facility] messaging system. Electronically Signed   By: Rise Mu M.D.   On: 06/02/2023 21:14   CT Head Wo Contrast Result Date: 06/02/2023 CLINICAL DATA:  Initial evaluation for acute mental status change, unknown cause. EXAM: CT HEAD WITHOUT CONTRAST TECHNIQUE: Contiguous axial images were obtained from the base of the skull through the vertex without intravenous contrast. RADIATION DOSE REDUCTION: This exam was performed according to the departmental dose-optimization program which includes automated exposure control, adjustment of the mA and/or kV according to patient size and/or use of iterative reconstruction technique.  COMPARISON:  CT from 05/12/2023 FINDINGS: Brain: Large area of evolving cytotoxic edema seen involving the left cerebral hemisphere, essentially involving the entirety of the left MCA distribution, are consistent with a large evolving left MCA territory infarct, subacute in appearance. Associated regional mass effect with effacement of the left lateral ventricle. Associated 11-12 mm of right-to-left shift. Mild asymmetric dilatation of the right lateral ventricle without overt trapping or transependymal flow of CSF. Basilar cisterns remain patent. Acute extra-axial hemorrhage overlying the left cerebral convexity measures up to 7 mm in thickness (series 2, image 17). No acute intraparenchymal hemorrhage. No mass lesion. Underlying atrophy with chronic small vessel ischemic disease noted. 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 No other Vascular: Hyperdense left M1 segment, likely occluded. Skull: Scalp soft tissues demonstrate no acute finding. Calvarium intact. Sinuses/Orbits: Globes and orbital soft tissues within normal limits. Paranasal sinuses are clear. No mastoid effusion. Other: None. IMPRESSION: 1. 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 Associated regional mass effect with 11-12 mm of right-to-left shift. 2. Acute extra-axial hemorrhage overlying the left cerebral convexity measuring up to 7 mm in thickness. 3. Hyperdense left M1 segment, concerning for occlusion. Correlation with dedicated CTA recommended. 4. Underlying atrophy with chronic small vessel ischemic disease, with chronic infarcts about the right basal ganglia and right thalamus. Critical results were communicated to Dr. Wilford Corner at 8:15 pm on 06/02/2023 by text page via the [facility] messaging system. Electronically Signed   By: Rise Mu M.D.   On: 06/02/2023 20:18   DG Chest Portable 1 View Result Date: 06/02/2023 CLINICAL DATA:  Sepsis EXAM: PORTABLE CHEST 1 VIEW COMPARISON:  Chest  x-ray 04/10/2023 FINDINGS: The heart size and mediastinal contours are within normal limits. Both lungs are clear. The visualized skeletal structures are unremarkable. IMPRESSION: No active disease. Electronically Signed  By: Darliss Cheney M.D.   On: 06/02/2023 19:18   _______________________________________________________________________________________________________ Latest  Blood pressure 115/80, pulse 99, temperature 99.9 F (37.7 C), temperature source Rectal, resp. rate 18, SpO2 96%.   Vitals  labs and radiology finding personally reviewed  Review of Systems:    Pertinent positives include: confusion  Constitutional:  No weight loss, night sweats, Fevers, chills, fatigue, weight loss  HEENT:  No headaches, Difficulty swallowing,Tooth/dental problems,Sore throat,  No sneezing, itching, ear ache, nasal congestion, post nasal drip,  Cardio-vascular:  No chest pain, Orthopnea, PND, anasarca, dizziness, palpitations.no Bilateral lower extremity swelling  GI:  No heartburn, indigestion, abdominal pain, nausea, vomiting, diarrhea, change in bowel habits, loss of appetite, melena, blood in stool, hematemesis Resp:  no shortness of breath at rest. No dyspnea on exertion, No excess mucus, no productive cough, No non-productive cough, No coughing up of blood.No change in color of mucus.No wheezing. Skin:  no rash or lesions. No jaundice GU:  no dysuria, change in color of urine, no urgency or frequency. No straining to urinate.  No flank pain.  Musculoskeletal:  No joint pain or no joint swelling. No decreased range of motion. No back pain.  Psych:  No change in mood or affect. No depression or anxiety. No memory loss.  Neuro: no localizing neurological complaints, no tingling, no weakness, no double vision, no gait abnormality, no slurred speech, no   All systems reviewed and apart from HOPI all are  negative _______________________________________________________________________________________________ Past Medical History:   Past Medical History:  Diagnosis Date   Dementia (HCC)    Hyperlipemia    TIA (transient ischemic attack)       No past surgical history on file.  Social History:   reports that he has quit smoking. He has never used smokeless tobacco. No history on file for alcohol use and drug use.     Family History:  Family History  Problem Relation Age of Onset   Heart disease Neg Hx    ______________________________________________________________________________________________ Allergies: No Known Allergies   Prior to Admission medications   Medication Sig Start Date End Date Taking? Authorizing Provider  amLODipine (NORVASC) 5 MG tablet Take 5 mg by mouth daily.   Yes [provider]  atorvastatin (LIPITOR) 40 MG tablet Take 40 mg by mouth at bedtime. 02/18/23  Yes [provider]  calcium carbonate (TUMS - DOSED IN MG ELEMENTAL CALCIUM) 500 MG chewable tablet Chew 1 tablet by mouth daily.   Yes [provider]  clopidogrel (PLAVIX) 75 MG tablet Take 75 mg by mouth in the morning.   Yes [provider]  levETIRAcetam (KEPPRA) 250 MG tablet Take 2 tablets (500 mg total) by mouth 2 (two) times daily. 05/12/23  Yes Derwood Kaplan, MD  Multiple Vitamin (MULTIVITAMIN WITH MINERALS) TABS tablet Take 1 tablet by mouth daily. Patient taking differently: Take 1 tablet by mouth in the morning. 12/18/21  Yes Standley Brooking, MD  polyethylene glycol (MIRALAX / GLYCOLAX) 17 g packet Take 17 g by mouth daily.   Yes [provider]  sennosides-docusate sodium (SENOKOT-S) 8.6-50 MG tablet Take 2 tablets by mouth in the morning.   Yes [provider]  Vitamin D, Ergocalciferol, (DRISDOL) 1.25 MG (50000 UNIT) CAPS capsule Take 50,000 Units by mouth every Monday.   Yes [provider]     ___________________________________________________________________________________________________ Physical Exam:    06/02/2023   10:00 PM 06/02/2023    7:45 PM 06/02/2023    7:30 PM  Vitals with  BMI  Systolic 115 118 098  Diastolic 80 84 75  Pulse 99 83 92     1. General:  in No Acute distress shallow respirations Chronically ill  -appearing 2. Psychological: Somnolent t not oriented 3. Head/ENT:   Dry Mucous Membranes                          Head Non traumatic, neck supple                          Poor Dentition 4. SKIN: decreased Skin turgor,  Skin clean Dry and intact no rash    5. Heart: Regular rate and rhythm no Murmur, no Rub or gallop 6. Lungs no wheezes or crackles   7. Abdomen: Soft, non-tender, Non distended 8. Lower extremities: no clubbing, cyanosis, no edema 9. Neurologically dense right hemiparesis with a forced left gaze   10. MSK: Normal range of motion    Chart has been reviewed  ______________________________________________________________________________________________  Assessment/Plan 83 y.o. male with medical history significant of Dementia, CVA, TIA, seizure activity  Admitted for CVA   Present on Admission:  Stroke (HCC)  Stercoral colitis  Lactic acidosis  Essential hypertension  Mixed hyperlipidemia  Dementia without behavioral disturbance (HCC)  Acute ischemic stroke (HCC)  Elevated troponin  Hepatic vein thrombosis (HCC)     Stercoral colitis Pt asymptomatic, comfort care at this time would avoid over aggressive interventions  Lactic acidosis Patient is comfort care at this time hold off on additional blood work  Essential hypertension Patient unable to tolerate p.o. at this time Comfort care would not be a candidate for aggressive blood pressure management assessment completed with comfort goals  Mixed hyperlipidemia Unable to tolerate Lipitor p.o. at this time  Dementia without behavioral disturbance (HCC) Chronic  stable  Acute ischemic stroke [facility]) Seen by neurology severe completed stroke not a candidate for tPA very poor prognosis discussed with family at this point they would like to proceed with DNR/DNI and comfort care order palliative care consult  Elevated troponin In the setting of severe stroke.  No reports of chest pain given intracranial hemorrhage would not be a good candidate for anticoagulation aggressive interventions  Hepatic vein thrombosis (HCC) Given intracranial hemorrhage patient not a candidate for anticoagulation comfort care appears to be asymptomatic  History of seizure disorder Continue Keppra IV to 250 mg twice daily   Other plan as per orders.  DVT prophylaxis:  SCD      Code Status:  DNR/DNI comfort care as per family  I had personally discussed CODE STATUS with family  ACPhas been reviewed    Family Communication:   Family  at  Bedside  plan of care was discussed  with niece and sister  Diet n.p.o.  Disposition Plan: Expected hospital death Following barriers for discharge:                                                        Will need consultants to evaluate patient prior to discharge       Consult Orders  (From admission, onward)           Start     Ordered   06/02/23 2205  Consult to hospitalist  Pg by Viviann Spare  Once       Provider:  (Not yet assigned)  Question Answer Comment  Place call to: [facility]   Reason for Consult Admit      06/02/23 2204                            Palliative care    consulted                  Consults called: Neurology is aware Cardiology is aware   Admission status:  ED Disposition     ED Disposition  Admit   Condition  --   Comment  Hospital Area: [facility] [[id number]]  Level of Care: Telemetry Medical [[id number]]  May admit patient to [facility] or [facility] if equivalent level of care is available:: No  Covid Evaluation: Asymptomatic - no recent exposure (last 10 days)  testing not required  Diagnosis: Stroke [facility]) [[id number]]  Admitting Physician: Therisa Doyne [[id number]]  Attending Physician: Therisa Doyne [[id number]]  Certification:: I certify this patient will need inpatient services for at least 2 midnights  Expected Medical Readiness: 06/05/2023            inpatient     I Expect 2 midnight stay secondary to severity of patient's current illness need for inpatient interventions justified by the following:    Severe lab/radiological/exam abnormalities including:   Large CVA with hemorrhagic conversion and extensive comorbidities including:  Dementia   That are currently affecting medical management.   I expect  patient to be hospitalized for 2 midnights requiring inpatient medical care.  Patient is at high risk for adverse outcome (such as loss of life or disability)    Indication for inpatient stay as follows:  Severe change from baseline regarding mental status   Very poor prognosis expect hospital death   Level of care    tele   Lab Results  Component Value Date   SARSCOV2NAA NEGATIVE 06/02/2023     Precautions: admitted as  Covid Negative  Mayela Bullard 06/03/2023, 12:11 AM   [facility]     after 2 AM please page floor coverage PA If 7AM-7PM, please contact the day team taking care of the patient using [email or web address]

## 2023-06-02 NOTE — Assessment & Plan Note (Signed)
Given intracranial hemorrhage patient not a candidate for anticoagulation comfort care appears to be asymptomatic

## 2023-06-02 NOTE — ED Triage Notes (Signed)
Pt [facility] from [facility] for AMS.  Per EMS [facility] endorses new facial droop with no LKW. Pt is also in afib/rvr at rate of 130-170 126/80 ETC02 25-30  At baseline pt tracks with eyes and picks things up with his hands.  Pt is responsive to painful stimuli only.

## 2023-06-02 NOTE — Subjective & Objective (Signed)
Patient with history of dementia currently residing at nursing home facility Found to have new facial droop right hemiplegia with forced leftward gaze and became less responsive Today was found to be in A-fib with RVR heart rate 130s CT head showed large complicated left Hemi spheric infarct CT angio showed left ICA occlusion Neurology was consulted patient not a candidate for tPA given completed stroke Prior history of TIA in the past

## 2023-06-02 NOTE — ED Provider Notes (Cosign Needed Addendum)
[place] EMERGENCY DEPARTMENT AT [facility] Provider Note   CSN: [id number] Arrival date & time: 06/02/23  1720     History No chief complaint on file.   Jeff Wilson is a 83 y.o. male with medical history of dementia, hyperlipidemia, TIA, seizures.  Patient presents from SNF for altered mental status.  Reached out to SNF nurse who reports that the patient was noted to have a left-sided facial droop earlier today and was not responding to verbal stimuli which is his baseline.  Apparently the CNA at his facility give him a "whole bed bath" and the patient "did not even flinch".  Nurse at facility reached out to the facility physician who recommended observation, there was low concern due to the patient being found in a similar state on 1/13 where he was noted to be having seizures.  The patient is compliant on patient medications apparently per the nursing staff at facility.  The patient's last known well is not known at this time.  He arrives tachycardic, EMS is concerned he is in A-fib RVR.  The patient is unable to give any information regarding his symptoms.  Will collect extensive number labs, CT head, CTA, chest x-ray, CT abdomen pelvis.  HPI     Home Medications Prior to Admission medications   Medication Sig Start Date End Date Taking? Authorizing Provider  amLODipine (NORVASC) 5 MG tablet Take 5 mg by mouth daily.   Yes [provider]  atorvastatin (LIPITOR) 40 MG tablet Take 40 mg by mouth at bedtime. 02/18/23  Yes [provider]  calcium carbonate (TUMS - DOSED IN MG ELEMENTAL CALCIUM) 500 MG chewable tablet Chew 1 tablet by mouth daily.   Yes [provider]  clopidogrel (PLAVIX) 75 MG tablet Take 75 mg by mouth in the morning.   Yes [provider]  levETIRAcetam (KEPPRA) 250 MG tablet Take 2 tablets (500 mg total) by mouth 2 (two) times daily. 05/12/23  Yes Derwood Kaplan, MD  Multiple Vitamin (MULTIVITAMIN WITH MINERALS) TABS  tablet Take 1 tablet by mouth daily. Patient taking differently: Take 1 tablet by mouth in the morning. 12/18/21  Yes Standley Brooking, MD  polyethylene glycol (MIRALAX / GLYCOLAX) 17 g packet Take 17 g by mouth daily.   Yes [provider]  sennosides-docusate sodium (SENOKOT-S) 8.6-50 MG tablet Take 2 tablets by mouth in the morning.   Yes [provider]  Vitamin D, Ergocalciferol, (DRISDOL) 1.25 MG (50000 UNIT) CAPS capsule Take 50,000 Units by mouth every Monday.   Yes [provider]      Allergies    Patient has no known allergies.    Review of Systems   Review of Systems  Unable to perform ROS: Mental status change (Level 5 caveat)  All other systems reviewed and are negative.   Physical Exam Updated Vital Signs BP 115/80   Pulse 99   Temp 99.4 F (37.4 C) (Axillary)   Resp 18   SpO2 96%  Physical Exam Vitals and nursing note reviewed.  Constitutional:      Appearance: He is ill-appearing.  HENT:     Head: Normocephalic.     Mouth/Throat:     Mouth: Mucous membranes are dry.  Eyes:     Comments: Pupils not responsive bilaterally  Cardiovascular:     Rate and Rhythm: Tachycardia present. Rhythm irregular.  Pulmonary:     Effort: Pulmonary effort is normal.     Breath sounds: Normal breath sounds. No wheezing.  Abdominal:     General: Abdomen is flat.     Tenderness: There is no abdominal tenderness.  Skin:    General: Skin is warm and dry.     Capillary Refill: Capillary refill takes less than 2 seconds.  Neurological:     Mental Status: He is alert.     Comments: Patient with left-sided gaze.  Patient unable to participate in examination. Pupils not responsive to light bilaterally.  Apparent left-sided facial droop.     ED Results / Procedures / Treatments   Labs (all labs ordered are listed, but only abnormal results are displayed) Labs Reviewed  CBC - Abnormal; Notable for the following components:      Result Value   WBC  12.5 (*)    All other components within normal limits  BASIC METABOLIC PANEL - Abnormal; Notable for the following components:   Glucose, Bld 108 (*)    All other components within normal limits  PROTIME-INR - Abnormal; Notable for the following components:   Prothrombin Time 15.5 (*)    All other components within normal limits  I-STAT CG4 LACTIC ACID, ED - Abnormal; Notable for the following components:   Lactic Acid, Venous 4.6 (*)    All other components within normal limits  TROPONIN I (HIGH SENSITIVITY) - Abnormal; Notable for the following components:   Troponin I (High Sensitivity) 773 (*)    All other components within normal limits  TROPONIN I (HIGH SENSITIVITY) - Abnormal; Notable for the following components:   Troponin I (High Sensitivity) 878 (*)    All other components within normal limits  RESP PANEL BY RT-PCR (RSV, FLU A&B, COVID)  RVPGX2  CULTURE, BLOOD (ROUTINE X 2)  CULTURE, BLOOD (ROUTINE X 2)  BRAIN NATRIURETIC PEPTIDE  AMMONIA  APTT  I-STAT CG4 LACTIC ACID, ED    EKG EKG Interpretation Date/Time:  Monday June 02 2023 18:28:28 EST Ventricular Rate:  111 PR Interval:  52 QRS Duration:  93 QT Interval:  390 QTC Calculation: 530 R Axis:   -66  Text Interpretation: Atrial fibrillation Ventricular premature complex Inferior infarct, old Prolonged QT interval Confirmed by Kristine Royal [phone]) on 06/02/2023 6:40:31 PM  Radiology CT Angio Chest PE W and/or Wo Contrast Result Date: 06/02/2023 CLINICAL DATA:  Altered mental status and sepsis, high probability of pulmonary embolism. EXAM: CT ANGIOGRAPHY CHEST CT ABDOMEN AND PELVIS WITH CONTRAST TECHNIQUE: Multidetector CT imaging of the chest was performed using the standard protocol during bolus administration of intravenous contrast. Multiplanar CT image reconstructions and MIPs were obtained to evaluate the vascular anatomy. Multidetector CT imaging of the abdomen and pelvis was performed using the standard  protocol during bolus administration of intravenous contrast. RADIATION DOSE REDUCTION: This exam was performed according to the departmental dose-optimization program which includes automated exposure control, adjustment of the mA and/or kV according to patient size and/or use of iterative reconstruction technique. CONTRAST:  75mL OMNIPAQUE IOHEXOL 350 MG/ML SOLN COMPARISON:  Portable chest today, AP Lat chest 04/10/2023, CT abdomen and pelvis with contrast 12/10/2021, and the report of a CTA chest 02/28/2008, images for which are no longer available in PACS. FINDINGS: CTA CHEST FINDINGS Cardiovascular: The pulmonary arteries are normal in caliber. No arterial embolus is seen through the segmental divisions. The subsegmental arterial bed is largely not evaluated due to obscuration by respiratory motion. The cardiac size is normal. There is no pericardial effusion. There trace single-vessel calcifications in the LAD coronary artery. There is mild aortic atherosclerosis and tortuosity. Aortic opacification  just insufficient to assess for dissection. Pulmonary veins are nondistended. Mediastinum/Nodes: No enlarged mediastinal, hilar, or axillary lymph nodes. Thyroid gland and esophagus demonstrate no significant findings. There are retained secretions in the proximal trachea. Both main bronchi are clear. Lungs/Pleura: Segmental and subsegmental mucous plugging noted in the posterior basal lower lobes, mild subsegmental atelectasis in the posterior lower lobes. There are trace pleural effusions. No consolidation or nodules are seen through the breathing motion. Musculoskeletal: No chest wall mass is seen. 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 Mild thoracic kyphosis. No acute or significant osseous findings. Review of the MIP images confirms the above findings. CT ABDOMEN and PELVIS FINDINGS Hepatobiliary: Stable 5 mm too small to characterize hypodensity in the dome of segment  8, probably a small cyst. The liver parenchyma is otherwise unremarkable. Gallbladder and bile ducts are unremarkable. There is suspicion of a small nonocclusive thrombus in the proximal right hepatic vein on 4: 12-14 and 9:78. Rest of the hepatic veins and the portal venous structures opacify normally. Pancreas: There are occasional pancreatic parenchymal calcifications consistent with chronic calcific pancreatitis. No acute pancreatic inflammatory changes or mass enhancement are seen, no ductal dilatation. Spleen: No abnormality. Adrenals/Urinary Tract: There is no adrenal mass. Right kidney is unremarkable. There is a stable 1 cm Bosniak 1 cyst in the superior pole of the left kidney medially, Hounsfield density is 9. There is a stable 7 mm too small to characterize Bosniak 2 cyst in the outer midpole left kidney. There is no mass enhancement. There is no urinary stone or obstruction. Both kidneys excrete symmetrically on the delayed images. There is impression on the bladder by the enlarged prostate. There is mild bladder thickening versus underdistention or hypertrophy. Correlate with urinalysis for possible significance. Stomach/Bowel: Unremarkable stomach and unopacified small bowel. Normal caliber appendix. Scattered sigmoid diverticula without evidence of diverticulitis. Large stool accumulation distal sigmoid colon and rectum is again noted and slight perirectal fat stranding which could be from dependent congestive changes or stercoral proctitis. There is no overt wall thickening or pneumatosis. Vascular/Lymphatic: As above there is suspicion for a small nonocclusive thrombus in the right hepatic vein. There is mild aortic atherosclerosis without AAA or dissection. No other significant vascular findings. No lymphadenopathy is seen. Reproductive: Moderate to severe prostatomegaly, transverse axis 6.2 cm, purposes 6.4 cm, with bladder impression. Other: Small umbilical and left inguinal fat hernias. No  incarcerated hernia. There is trace presacral ascites. There is no free hemorrhage, free air, or abscess. Musculoskeletal: There is disc collapse and interbody and facet joint ankylosis at L5-S1, multilevel lumbar bridging osteophytes. Advanced facet hypertrophy L3-4 and L4-5, with acquired spinal stenosis L4-5. There is ankylosis across the right SI joint. Mild hip DJD. Review of the MIP images confirms the above findings. IMPRESSION: 1. No evidence of pulmonary arterial embolus through the segmental divisions. The subsegmental arterial bed is largely not evaluated due to obscuration by respiratory motion. 2. Segmental and subsegmental mucous plugging in the posterior basal lower lobes with mild subsegmental atelectasis. Trace pleural effusions. 3. Aortic and coronary artery atherosclerosis. 4. Suspicion of a small nonocclusive thrombus in the proximal right hepatic vein. 5. Large stool accumulation in the distal sigmoid colon and rectum with slight perirectal fat stranding, which could be from dependent congestive changes or stercoral proctitis. 6. Diverticulosis without evidence of diverticulitis. 7. Moderate to severe prostatomegaly with bladder impression. 8. Mild bladder thickening versus underdistention or hypertrophy. Correlate with urinalysis for possible significance. 9. Chronic calcific pancreatitis.  No evidence of acute pancreatitis. 10. Umbilical and left inguinal fat hernias. Degenerative and DISH changes. Aortic Atherosclerosis (ICD10-I70.0). Electronically Signed   By: Almira Bar M.D.   On: 06/02/2023 21:22   CT ABDOMEN PELVIS W CONTRAST Result Date: 06/02/2023 CLINICAL DATA:  Altered mental status and sepsis, high probability of pulmonary embolism. EXAM: CT ANGIOGRAPHY CHEST CT ABDOMEN AND PELVIS WITH CONTRAST TECHNIQUE: Multidetector CT imaging of the chest was performed using the standard protocol during bolus administration of intravenous contrast. Multiplanar CT image reconstructions and  MIPs were obtained to evaluate the vascular anatomy. Multidetector CT imaging of the abdomen and pelvis was performed using the standard protocol during bolus administration of intravenous contrast. RADIATION DOSE REDUCTION: This exam was performed according to the departmental dose-optimization program which includes automated exposure control, adjustment of the mA and/or kV according to patient size and/or use of iterative reconstruction technique. CONTRAST:  75mL OMNIPAQUE IOHEXOL 350 MG/ML SOLN COMPARISON:  Portable chest today, AP Lat chest 04/10/2023, CT abdomen and pelvis with contrast 12/10/2021, and the report of a CTA chest 02/28/2008, images for which are no longer available in PACS. FINDINGS: CTA CHEST FINDINGS Cardiovascular: The pulmonary arteries are normal in caliber. No arterial embolus is seen through the segmental divisions. The subsegmental arterial bed is largely not evaluated due to obscuration by respiratory motion. The cardiac size is normal. There is no pericardial effusion. There trace single-vessel calcifications in the LAD coronary artery. There is mild aortic atherosclerosis and tortuosity. Aortic opacification just insufficient to assess for dissection. Pulmonary veins are nondistended. Mediastinum/Nodes: No enlarged mediastinal, hilar, or axillary lymph nodes. Thyroid gland and esophagus demonstrate no significant findings. There are retained secretions in the proximal trachea. Both main bronchi are clear. Lungs/Pleura: Segmental and subsegmental mucous plugging noted in the posterior basal lower lobes, mild subsegmental atelectasis in the posterior lower lobes. There are trace pleural effusions. No consolidation or nodules are seen through the breathing motion. Musculoskeletal: No chest wall mass is seen. 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 Mild thoracic kyphosis. No acute or significant osseous findings. Review of the MIP images confirms the  above findings. CT ABDOMEN and PELVIS FINDINGS Hepatobiliary: Stable 5 mm too small to characterize hypodensity in the dome of segment 8, probably a small cyst. The liver parenchyma is otherwise unremarkable. Gallbladder and bile ducts are unremarkable. There is suspicion of a small nonocclusive thrombus in the proximal right hepatic vein on 4: 12-14 and 9:78. Rest of the hepatic veins and the portal venous structures opacify normally. Pancreas: There are occasional pancreatic parenchymal calcifications consistent with chronic calcific pancreatitis. No acute pancreatic inflammatory changes or mass enhancement are seen, no ductal dilatation. Spleen: No abnormality. Adrenals/Urinary Tract: There is no adrenal mass. Right kidney is unremarkable. There is a stable 1 cm Bosniak 1 cyst in the superior pole of the left kidney medially, Hounsfield density is 9. There is a stable 7 mm too small to characterize Bosniak 2 cyst in the outer midpole left kidney. There is no mass enhancement. There is no urinary stone or obstruction. Both kidneys excrete symmetrically on the delayed images. There is impression on the bladder by the enlarged prostate. There is mild bladder thickening versus underdistention or hypertrophy. Correlate with urinalysis for possible significance. Stomach/Bowel: Unremarkable stomach and unopacified small bowel. Normal caliber appendix. Scattered sigmoid diverticula without evidence of diverticulitis. Large stool accumulation distal sigmoid colon and rectum is again noted and slight perirectal fat stranding which could be from dependent congestive  changes or stercoral proctitis. There is no overt wall thickening or pneumatosis. Vascular/Lymphatic: As above there is suspicion for a small nonocclusive thrombus in the right hepatic vein. There is mild aortic atherosclerosis without AAA or dissection. No other significant vascular findings. No lymphadenopathy is seen. Reproductive: Moderate to severe  prostatomegaly, transverse axis 6.2 cm, purposes 6.4 cm, with bladder impression. Other: Small umbilical and left inguinal fat hernias. No incarcerated hernia. There is trace presacral ascites. There is no free hemorrhage, free air, or abscess. Musculoskeletal: There is disc collapse and interbody and facet joint ankylosis at L5-S1, multilevel lumbar bridging osteophytes. Advanced facet hypertrophy L3-4 and L4-5, with acquired spinal stenosis L4-5. There is ankylosis across the right SI joint. Mild hip DJD. Review of the MIP images confirms the above findings. IMPRESSION: 1. No evidence of pulmonary arterial embolus through the segmental divisions. The subsegmental arterial bed is largely not evaluated due to obscuration by respiratory motion. 2. Segmental and subsegmental mucous plugging in the posterior basal lower lobes with mild subsegmental atelectasis. Trace pleural effusions. 3. Aortic and coronary artery atherosclerosis. 4. Suspicion of a small nonocclusive thrombus in the proximal right hepatic vein. 5. Large stool accumulation in the distal sigmoid colon and rectum with slight perirectal fat stranding, which could be from dependent congestive changes or stercoral proctitis. 6. Diverticulosis without evidence of diverticulitis. 7. Moderate to severe prostatomegaly with bladder impression. 8. Mild bladder thickening versus underdistention or hypertrophy. Correlate with urinalysis for possible significance. 9. Chronic calcific pancreatitis. No evidence of acute pancreatitis. 10. Umbilical and left inguinal fat hernias. Degenerative and DISH changes. Aortic Atherosclerosis (ICD10-I70.0). Electronically Signed   By: Almira Bar M.D.   On: 06/02/2023 21:22   CT ANGIO HEAD NECK W WO CM Result Date: 06/02/2023 CLINICAL DATA:  Initial evaluation for acute stroke. EXAM: CT ANGIOGRAPHY HEAD AND NECK WITH AND WITHOUT CONTRAST TECHNIQUE: Multidetector CT imaging of the head and neck was performed using the  standard protocol during bolus administration of intravenous contrast. Multiplanar CT image reconstructions and MIPs were obtained to evaluate the vascular anatomy. Carotid stenosis measurements (when applicable) are obtained utilizing NASCET criteria, using the distal internal carotid diameter as the denominator. RADIATION DOSE REDUCTION: This exam was performed according to the departmental dose-optimization program which includes automated exposure control, adjustment of the mA and/or kV according to patient size and/or use of iterative reconstruction technique. CONTRAST:  75mL OMNIPAQUE IOHEXOL 350 MG/ML SOLN COMPARISON:  Prior CT from earlier the same day. FINDINGS: CTA NECK FINDINGS Aortic arch: Visualized aortic arch within normal limits for caliber. Bovine branching pattern noted. Mild aortic atherosclerosis. No significant stenosis about the origin of the great vessels. Right carotid system: Right common and internal carotid arteries are patent without dissection. Mild atheromatous change about the right carotid bulb without hemodynamically significant greater than 50% stenosis. Left carotid system: Left CCA patent from its origin to the bifurcation. 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 Left ICA remains occluded to the skull base. Vertebral arteries: Both vertebral arteries arise from subclavian arteries. No proximal subclavian artery stenosis. Right vertebral artery slightly dominant. Vertebral arteries are patent without stenosis or dissection. Skeleton: No discrete or worrisome osseous lesions. Moderate spondylosis at C3-4 through C5-6. Other neck: No other acute finding. Upper chest: Layering secretions noted within the subglottic airway, suggesting at this patient is at risk for aspiration. No other acute finding. Visualized lungs are clear. Review of the MIP images confirms the above  findings CTA HEAD FINDINGS Anterior circulation: The right ICA  patent to the terminus without significant stenosis or other abnormality. Left ICA remains occluded to the terminus. A1 segments patent, with filling of the left A1 via collateral flow across the circle-of-Willis. Normal anterior communicating artery complex. Both ACAs patent without significant stenosis. Right M1 segment and its distal branches are patent and well perfused. Collateral filling of the left M1 segment the of the circle-of-Willis and possibly the left PCOM. Probable subocclusive thrombus within the left M1 segment (series 7, images 91, 92). Some perfusion seen within the left MCA branches distally. Posterior circulation: Both V4 segments patent without significant stenosis. Left PICA patent. Right PICA not well seen. Basilar diminutive but patent without stenosis. Superior cerebral arteries patent bilaterally. Predominant fetal type origin of the PCAs bilaterally, with partial occlusion of the proximal left PCOM (series 6, image 341). Left PCA patent distally and perfused to its distal aspect. Right PCA widely patent without stenosis. Venous sinuses: Patent allowing for timing the contrast bolus. Anatomic variants: As above.  No visible aneurysm. Review of the MIP images confirms the above findings IMPRESSION: 1. 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 Attenuated perfusion of the left MCA via collateral flow across the circle-of-Willis. Probable subocclusive thrombus within the left M1 segment. 2. 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 Left PCA patent and perfused distally. 3. Mild atheromatous change about the right carotid bulb without hemodynamically significant stenosis. 4. Layering secretions within the subglottic airway, suggesting that this patient is at risk for aspiration. Aortic Atherosclerosis (ICD10-I70.0). These results were communicated  to Dr. Wilford Corner at 8:18 pm on 06/02/2023 by text page via the [facility] messaging system. Electronically Signed   By: Rise Mu M.D.   On: 06/02/2023 21:14   CT Head Wo Contrast Result Date: 06/02/2023 CLINICAL DATA:  Initial evaluation for acute mental status change, unknown cause. EXAM: CT HEAD WITHOUT CONTRAST TECHNIQUE: Contiguous axial images were obtained from the base of the skull through the vertex without intravenous contrast. RADIATION DOSE REDUCTION: This exam was performed according to the departmental dose-optimization program which includes automated exposure control, adjustment of the mA and/or kV according to patient size and/or use of iterative reconstruction technique. COMPARISON:  CT from 05/12/2023 FINDINGS: Brain: Large area of evolving cytotoxic edema seen involving the left cerebral hemisphere, essentially involving the entirety of the left MCA distribution, are consistent with a large evolving left MCA territory infarct, subacute in appearance. Associated regional mass effect with effacement of the left lateral ventricle. Associated 11-12 mm of right-to-left shift. Mild asymmetric dilatation of the right lateral ventricle without overt trapping or transependymal flow of CSF. Basilar cisterns remain patent. Acute extra-axial hemorrhage overlying the left cerebral convexity measures up to 7 mm in thickness (series 2, image 17). No acute intraparenchymal hemorrhage. No mass lesion. Underlying atrophy with chronic small vessel ischemic disease noted. 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 No other Vascular: Hyperdense left M1 segment, likely occluded. Skull: Scalp soft tissues demonstrate no acute finding. Calvarium intact. Sinuses/Orbits: Globes and orbital soft tissues within normal limits. Paranasal sinuses are clear. No mastoid effusion. Other: None. IMPRESSION: 1. 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 Associated regional mass  effect with 11-12 mm of right-to-left shift. 2. Acute extra-axial hemorrhage overlying the left cerebral convexity measuring up to  7 mm in thickness. 3. Hyperdense left M1 segment, concerning for occlusion. Correlation with dedicated CTA recommended. 4. Underlying atrophy with chronic small vessel ischemic disease, with chronic infarcts about the right basal ganglia and right thalamus. Critical results were communicated to Dr. Wilford Corner at 8:15 pm on 06/02/2023 by text page via the [facility] messaging system. Electronically Signed   By: Rise Mu M.D.   On: 06/02/2023 20:18   DG Chest Portable 1 View Result Date: 06/02/2023 CLINICAL DATA:  Sepsis EXAM: PORTABLE CHEST 1 VIEW COMPARISON:  Chest x-ray 04/10/2023 FINDINGS: The heart size and mediastinal contours are within normal limits. Both lungs are clear. The visualized skeletal structures are unremarkable. IMPRESSION: No active disease. Electronically Signed   By: Darliss Cheney M.D.   On: 06/02/2023 19:18    Procedures .Critical Care  Performed by: Al Decant, PA-C Authorized by: Al Decant, PA-C   Critical care provider statement:    Critical care time (minutes):  60   Critical care time was exclusive of:  Separately billable procedures and treating other patients   Critical care was necessary to treat or prevent imminent or life-threatening deterioration of the following conditions:  CNS failure or compromise   Critical care was time spent personally by me on the following activities:  Blood draw for specimens, development of treatment plan with patient or surrogate, discussions with consultants, discussions with primary provider, evaluation of patient's response to treatment, examination of patient, interpretation of cardiac output measurements, obtaining history from patient or surrogate, ordering and performing treatments and interventions, ordering and review of laboratory studies, ordering and review of radiographic  studies, pulse oximetry, re-evaluation of patient's condition and review of old charts   I assumed direction of critical care for this patient from another provider in my specialty: no     Care discussed with: admitting provider       Medications Ordered in ED Medications  sodium chloride 0.9 % bolus 500 mL (500 mLs Intravenous New Bag/Given 06/02/23 1805)  iohexol (OMNIPAQUE) 350 MG/ML injection 75 mL (75 mLs Intravenous Contrast Given 06/02/23 2027)    ED Course/ Medical Decision Making/ A&P Clinical Course as of 06/02/23 2318  Mon Jun 02, 2023  1737 AMS, left sided facial droop, history of CVA, takes thinners. Wasn't responding to verbal stimuli. CNA gave him whole bed bath and patient "didn't even flinch" so facility provider recommended observation, low concern because patient was found to in similar state on 1/13. Seizure last time. Compliant on seizure meds. LKW unsure. ` [CG]  2203 Discussed with Dr. Granville Lewis who states nothing to do right now. Will follow along and consult PRN [CG]    Clinical Course User Index [CG] Al Decant, PA-C   Medical Decision Making Amount and/or Complexity of Data Reviewed Labs: ordered. Radiology: ordered.  Risk Decision regarding hospitalization.   83 year old male presents for evaluation of altered mental status.  Please see HPI for further details.  On exam patient unable to participate in examination.  Patient is afebrile however tachycardic.  EKG shows sinus tachycardia.  Repeat EKG shows A-fib.  Given 500 mL of fluid to control tachycardia.  Patient lung sounds clear bilaterally, not hypoxic, protecting airway.  Abdomen soft and compressible, no tenderness noted.  Patient unable to [place] examination, unable to assess complete neurologic status.  Patient is tracking to the left with his eyes, does seemingly have a left-sided facial droop.  Will collect CT head, chest x-ray, CT abdomen/pelvis with contrast, CTA to assess  for PE.  Will  collect lactic acid, blood cultures, troponin, ammonia, APTT, PT/INR, BMP, CBC, viral panel, BNP.  CBC with leukocytosis 12.5, viral panel negative.  Ammonia WNL.  PT/INR elevated 15.5.  Metabolic panel without electrolyte derangement.  aPTT WNL.  Lactic acid 4.6.  Troponin 773 with delta 878.  Reach out to Dr. Granville Lewis of cardiology who recommends nothing to do in the acute setting.  He states that they will follow along and are available to consult as needed.  EKG is nonischemic.  Patient CTA unremarkable for PE.  Patient CT abdomen pelvis shows distended bladder treated with indwelling Foley catheter.  Also noted to have large amount of stool in rectal vault.  Chest x-ray unremarkable.  CT head shows a large evolving left MCA territory infarct with a subacute P appearance.  Also noted to have acute extra-axial hemorrhage overlying the left cerebral convexity measuring up to 7 mm in thickness.  Also hyperdense left M1 segment concerning for occlusion.  Discussed with Dr. Wilford Corner who recommends admission for CVA workup.  Patient is full code.  Called ED chaplain at the bedside.  Discussed with [facility] who will admit patient.  Final Clinical Impression(s) / ED Diagnoses Final diagnoses:  Cerebrovascular accident (CVA), unspecified mechanism [facility])    Rx / DC Orders ED Discharge Orders     None             Clent Ridges 06/02/23 2318    Wynetta Fines, MD 06/03/23 [phone]

## 2023-06-02 NOTE — Assessment & Plan Note (Signed)
Unable to tolerate Lipitor p.o. at this time

## 2023-06-02 NOTE — Assessment & Plan Note (Signed)
Seen by neurology severe completed stroke not a candidate for tPA very poor prognosis discussed with family at this point they would like to proceed with DNR/DNI and comfort care order palliative care consult

## 2023-06-02 NOTE — Assessment & Plan Note (Signed)
Pt asymptomatic, comfort care at this time would avoid over aggressive interventions

## 2023-06-02 NOTE — Consult Note (Addendum)
NEUROLOGY CONSULT NOTE   Date of service: June 02, 2023 Patient Name: Jeff Wilson MRN:  [id number] DOB:  [birth year] Chief Complaint: "Strokelike symptoms" Requesting Provider: Wynetta Fines, MD  History of Present Illness  Jeff Wilson is a 83 y.o. male with hx of dementia resident at a nursing facility, hyperlipidemia, hypotension, history of seizures diagnosed in December 2024 in the setting of dementia with unremarkable EEG-presents from the facility where it was noted that he has a new facial droop and is less responsive.  At baseline he tracks with his eyes and picks up things with his hands and responsive to some extent-although details were not available as the nurse was not reachable.  He was noted to be in A-fib with RVR with rate 1 30-1 70 and a normal blood pressure. His head CT was done that shows a large completed left hemispheric infarct and a CT angiography head and neck which shows a left ICA occlusion.  Neurology was consulted for the stroke.  LKW: Unclear-somewhere in the last 48 hours per EDP report Modified rankin score: 4-Needs assistance to walk and tend to bodily needs IV Thrombolysis: Completed stroke precludes IV thrombolysis EVT: Same as above, also poor baseline modified Rankin  NIHSS components Score: Comment  1a Level of Conscious 0[x]  1[]  2[]  3[]      1b LOC Questions 0[]  1[]  2[x]       1c LOC Commands 0[]  1[]  2[x]       2 Best Gaze 0[]  1[]  2[x]       3 Visual 0[]  1[]  2[x]  3[]      4 Facial Palsy 0[]  1[x]  2[]  3[]      5a Motor Arm - left 0[]  1[x]  2[]  3[]  4[]  UN[]    5b Motor Arm - Right 0[]  1[]  2[]  3[]  4[x]  UN[]    6a Motor Leg - Left 0[]  1[x]  2[]  3[]  4[]  UN[]    6b Motor Leg - Right 0[]  1[]  2[]  3[]  4[x]  UN[]    7 Limb Ataxia 0[x]  1[]  2[]  3[]  UN[]     8 Sensory 0[]  1[]  2[x]  UN[]      9 Best Language 0[]  1[]  2[]  3[x]      10 Dysarthria 0[]  1[]  2[x]  UN[]      11 Extinct. and Inattention 0[x]  1[]  2[]       TOTAL: 26      ROS  Unable to perform due to his  aphasia  Past History   Past Medical History:  Diagnosis Date   Dementia (HCC)    Hyperlipemia    TIA (transient ischemic attack)     No past surgical history on file.  Family History: Family History  Problem Relation Age of Onset   Heart disease Neg Hx     Social History  reports that he has quit smoking. He has never used smokeless tobacco. No history on file for alcohol use and drug use.  No Known Allergies  Medications  No current facility-administered medications for this encounter.  Current Outpatient Medications:    amLODipine (NORVASC) 5 MG tablet, Take 5 mg by mouth daily., Disp: , Rfl:    atorvastatin (LIPITOR) 40 MG tablet, Take 40 mg by mouth at bedtime., Disp: , Rfl:    calcium carbonate (TUMS - DOSED IN MG ELEMENTAL CALCIUM) 500 MG chewable tablet, Chew 1 tablet by mouth daily., Disp: , Rfl:    clopidogrel (PLAVIX) 75 MG tablet, Take 75 mg by mouth in the morning., Disp: , Rfl:    levETIRAcetam (KEPPRA) 250 MG tablet, Take 2 tablets (500 mg total) by mouth  2 (two) times daily., Disp: 120 tablet, Rfl: 0   Multiple Vitamin (MULTIVITAMIN WITH MINERALS) TABS tablet, Take 1 tablet by mouth daily. (Patient taking differently: Take 1 tablet by mouth in the morning.), Disp: , Rfl:    polyethylene glycol (MIRALAX / GLYCOLAX) 17 g packet, Take 17 g by mouth daily., Disp: , Rfl:    sennosides-docusate sodium (SENOKOT-S) 8.6-50 MG tablet, Take 2 tablets by mouth in the morning., Disp: , Rfl:    Vitamin D, Ergocalciferol, (DRISDOL) 1.25 MG (50000 UNIT) CAPS capsule, Take 50,000 Units by mouth every Monday., Disp: , Rfl:   Vitals   Vitals:   06-03-2023 1900 2023/06/03 1915 Jun 03, 2023 1930 03-Jun-2023 1945  BP: 115/88 115/79 123/75 118/84  Pulse: (!) 101 93 92 83  Resp: 18 18 12 20   Temp:      TempSrc:      SpO2: 98% 98% 98% 99%    There is no height or weight on file to calculate BMI.  Physical Exam  General: Cachectic looking elderly man, comfortably laying in bed. HEENT:  Normocephalic atraumatic Lungs: Clear Cardiovascular: Regular rhythm Abdomen nondistended nontender Neurological exam He is awake alert He is completely nonverbal and aphasic. Cranial nerves: Pupils are equal round react light, has a forced leftward gaze, does not blink to threat from the right, blinks to threat from the left, has right lower facial asymmetry on the rest. Motor examination with densely plegic right upper and lower extremity.  Some movement noted with antigravity strength in the left upper and lower extremity with spontaneous movements but does not follow commands to keep them antigravity for 10 or 5 seconds. Sensation diminished on the right Coordination difficult to assess given his mentation  Labs/Imaging/Neurodiagnostic studies   CBC:  Recent Labs  Lab 06-03-23 1810  WBC 12.5*  HGB 14.0  HCT 43.4  MCV 85.4  PLT 231   Basic Metabolic Panel:  Lab Results  Component Value Date   NA 142 June 03, 2023   K 3.6 2023-06-03   CO2 25 06/03/23   GLUCOSE 108 (H) 2023/06/03   BUN 18 03-Jun-2023   CREATININE 0.91 2023/06/03   CALCIUM 9.6 June 03, 2023   GFRNONAA >60 Jun 03, 2023   GFRAA  12/05/2008    >60        The eGFR has been calculated using the MDRD equation. This calculation has not been validated in all clinical situations. eGFR's persistently <60 mL/min signify possible Chronic Kidney Disease.   Urine Drug Screen:     Component Value Date/Time   LABOPIA NONE DETECTED 11/06/2022 1730   COCAINSCRNUR NONE DETECTED 11/06/2022 1730   LABBENZ NONE DETECTED 11/06/2022 1730   AMPHETMU NONE DETECTED 11/06/2022 1730   THCU NONE DETECTED 11/06/2022 1730   LABBARB NONE DETECTED 11/06/2022 1730    Alcohol Level     Component Value Date/Time   ETH <10 11/06/2022 1456   INR  Lab Results  Component Value Date   INR 1.2 06/03/23   APTT  Lab Results  Component Value Date   APTT 29 06-03-2023   cT Head without contrast(Personally reviewed): Large  evolving left MCA infarct, subacute in appearance.  Associated regional mass effect with 11 to 12 mm of right-to-left shift.  Acute extra-axial hemorrhage overlying the left cerebral convexity measuring up to 7 mm in thickness.  Hyperdense left M1 segment concerning for occlusion.  Underlying atrophy with chronic small vessel disease.  CT angio Head and Neck with contrast(Personally reviewed): Acute occlusion of the left ICA.  Formal read pending  ASSESSMENT   Cynthia Stainback is a 83 y.o. male with above past medical history who had change in his neurological status noted at the facility with last known well somewhere in the last 48 hours presenting for evaluation to the emergency department.  On my examination, he is completely aphasic with dense right hemiplegia.  His head CT reveals a subacute large left MCA territory infarct.  Head CT also reveals extra-axial hemorrhage overlying the left cerebral convexity up to 7 mm in thickness. His CT angio head and neck is concerning for acute left ICA occlusion. He came in with A-fib with RVR-etiology of the stroke is likely cardioembolic Given his poor baseline prior to the stroke and now this large stroke, from an neurological recovery standpoint, the prognosis is going to be extremely poor.  Impression: Acute ischemic stroke, likely cardioembolic, A-fib with RVR-new.,  Extra-axial hemorrhage over the left cerebral convexity-acute.  RECOMMENDATIONS  Admit to the hospitalist - Stepdown unit Frequent neurochecks Telemetry Blood pressure goal-systolic 130-150. Use Cleviprex drip if over that goal. No antiplatelets or anticoagulants given the bleed Repeat head CT in about 6 to 8 hours-to evaluate for extra-axial hematoma stability. Not a candidate for thrombectomy or IV thrombolysis given completed stroke and outside the window as well as poor baseline MRIs. If desired full code, can obtain 2D echo, A1c, lipid panel. Management of troponinemia per  primary team I would avoid antiplatelets and anticoagulants for the bleed reasons above. Continue home keppra Consider Palliative care consult Plan was discussed with Dr. Rodena Medin Hospitalist has been called for admission-please feel free to reach out with questions as needed. Stroke team will follow ______________________________________________________________________    Signed, Milon Dikes, MD [facility]  CRITICAL CARE ATTESTATION Performed by: Milon Dikes, MD Total critical care time: 45 minutes Critical care time was exclusive of separately billable procedures and treating other patients and/or supervising APPs/Residents/Students Critical care was necessary to treat or prevent imminent or life-threatening deterioration. This patient is critically ill and at significant risk for neurological worsening and/or death and care requires constant monitoring. Critical care was time spent personally by me on the following activities: development of treatment plan with patient and/or surrogate as well as nursing, discussions with consultants, evaluation of patient's response to treatment, examination of patient, obtaining history from patient or surrogate, ordering and performing treatments and interventions, ordering and review of laboratory studies, ordering and review of radiographic studies, pulse oximetry, re-evaluation of patient's condition, participation in multidisciplinary rounds and medical decision making of high complexity in the care of this patient.

## 2023-06-02 NOTE — Assessment & Plan Note (Signed)
Continue Keppra IV to 250 mg twice daily

## 2023-06-02 NOTE — Assessment & Plan Note (Signed)
In the setting of severe stroke.  No reports of chest pain given intracranial hemorrhage would not be a good candidate for anticoagulation aggressive interventions

## 2023-06-02 NOTE — H&P (Incomplete)
Jeff Wilson NFA:[id number] DOB: [birth year] DOA: 06/02/2023     PCP: Jeff Dupes, MD   Patient arrived to ER on 06/02/23 at 1720 Referred by Attending Jeff Fines, MD   Patient coming from:    home Lives alone,   From facility [facility]    Chief Complaint: altered mental status   HPI: Jeff Wilson is a 83 y.o. 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 Neurology was consulted patient not a candidate for tPA given completed stroke Prior history of TIA in the past    Denies significant ETOH intake   Does not smoke    Lab Results  Component Value Date   SARSCOV2NAA NEGATIVE 06/02/2023   SARSCOV2NAA NEGATIVE 02/19/2023   SARSCOV2NAA NEGATIVE 05/22/2020        Regarding pertinent Chronic problems:     Hyperlipidemia -  on statins Lipitor (atorvastatin)      HTN on norvasc       Hx of CVA - *with/out residual deficits on Aspirin 81 mg  Plavix   Seizure DO - 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 Wasn't responding to verbal stimuli. CNA gave him whole bed bath and patient "didn't even flinch" so facility provider recommended observation, low concern because patient was found to in similar state on 1/13. Seizure last time. Compliant on seizure meds. LKW unsure. ` [CG]  2203 Discussed with Jeff Wilson who states nothing to do right now. Will follow along and consult PRN [CG]    Clinical Course User Index [CG] Jeff Decant, PA-C      Lab Orders         Resp panel by RT-PCR (RSV, Flu A&B, Covid) Anterior Nasal Swab         Blood culture (routine x 2)         CBC         Basic metabolic panel         Brain natriuretic peptide         Urinalysis, Routine w reflex microscopic -Urine, Clean Catch         Ammonia         Protime-INR         APTT         I-Stat CG4 Lactic Acid      CT HEAD Large evolving left MCA territory infarct, subacute in appearance. Associated regional mass effect with 11-12 mm of right-to-left shift. 2. Acute extra-axial hemorrhage overlying the left cerebral convexity measuring up to 7 mm in thickness. 3. Hyperdense left M1 segment, concerning for occlusion. Correlation with dedicated CTA recommended. 4. Underlying atrophy with chronic small vessel ischemic disease, with chronic infarcts about the right basal ganglia and right thalamus.   Associated regional mass effect with 11 to 12 mm of right-to-left shift.   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 Attenuated perfusion of the left  MCA via collateral flow across the circle-of-Willis. Probable subocclusive thrombus within the left M1 segment.   Layering secretions within the subglottic airway, suggesting that this patient is at risk for aspiration.     CXR -  NON acute  CTabd/pelvis - small nonocclusive thrombus in the proximal right hepatic vein  CTA chest -   no PE, Segmental and subsegmental mucous plugging in the posterior basal lower lobes with mild subsegmental atelectasis. Trace pleural effusions.  Large stool accumulation in the distal sigmoid colon and rectum with slight perirectal fat stranding, which could be from dependent congestive changes or stercoral proctitis.  Mild bladder thickening   . Chronic calcific pancreatitis.   Following Medications were ordered in ER: Medications  sodium chloride 0.9 % bolus 500 mL (500 mLs Intravenous New  Bag/Given 06/02/23 1805)  iohexol (OMNIPAQUE) 350 MG/ML injection 75 mL (75 mLs Intravenous Contrast Given 06/02/23 2027)    _______________________________________________________ ER Provider Called:     Neurology   Jeff Wilson They Recommend admit to medicine    SEEN in ER Recs:  Stepdown unit Frequent neurochecks Telemetry Blood pressure goal-systolic 130-150. Use Cleviprex drip if over that goal. No antiplatelets or anticoagulants given the bleed Repeat head CT in about 6 to 8 hours-to evaluate for extra-axial hematoma stability. Not a candidate for thrombectomy or IV thrombolysis given completed stroke and outside the window as well as poor baseline MRIs. If desired full code, can obtain 2D echo, A1c, lipid panel. Management of troponinemia per primary team I would avoid antiplatelets and anticoagulants for the bleed reasons above. Continue home keppra Consider Palliative care consult Plan was discussed with Jeff Wilson Hospitalist has been called for admission-please feel free to reach out with questions as needed. Stroke team will follow    ED Triage Vitals  Encounter Vitals Group     BP 06/02/23 1741 (!) 125/96     Systolic BP Percentile --      Diastolic BP Percentile --      Pulse Rate 06/02/23 1807 (!) 106     Resp 06/02/23 1807 18     Temp 06/02/23 1807 99.9 F (37.7 C)     Temp Source 06/02/23 1807 Rectal     SpO2 06/02/23 1807 97 %     Weight --      Height --      Head Circumference --      Peak Flow --      Pain Score --      Pain Loc --      Pain Education --      Exclude from Growth Chart --   ZOXW(96)@     _________________________________________ Significant initial  Findings: Abnormal Labs Reviewed  CBC - Abnormal; Notable for the following components:      Result Value   WBC 12.5 (*)    All other components within normal limits  BASIC METABOLIC PANEL - Abnormal; Notable for the following components:   Glucose, Bld 108 (*)    All other components  within normal limits  PROTIME-INR - Abnormal; Notable for the following components:   Prothrombin Time 15.5 (*)    All other components within normal limits  I-STAT CG4 LACTIC ACID, ED - Abnormal; Notable for the following components:   Lactic Acid, Venous 4.6 (*)    All other components within normal limits  TROPONIN I (HIGH SENSITIVITY) - Abnormal; Notable for the following components:   Troponin I (High Sensitivity) 773 (*)    All other components within  normal limits  TROPONIN I (HIGH SENSITIVITY) - Abnormal; Notable for the following components:   Troponin I (High Sensitivity) 878 (*)    All other components within normal limits  _________________________ Troponin  Cardiac Panel (last 3 results) Recent Labs    06/02/23 1810 06/02/23 2045  TROPONINIHS 773* 878*     ECG: Ordered Personally reviewed and interpreted by me showing: HR : 133 Rhythm: Sinus tachycardia Ventricular premature complex Inferior infarct, old Probable anterior infarct, old QTC 476  BNP (last 3 results) Recent Labs    06/02/23 1810  BNP 84.2     COVID-19 Labs  No results for input(s): "DDIMER", "FERRITIN", "LDH", "CRP" in the last 72 hours.  Lab Results  Component Value Date   SARSCOV2NAA NEGATIVE 06/02/2023   SARSCOV2NAA NEGATIVE 02/19/2023   SARSCOV2NAA NEGATIVE 05/22/2020   ____________________ This patient meets SIRS Criteria and may be septic. The recent clinical data is shown below. Vitals:   06/02/23 1930 06/02/23 1945 06/02/23 2200 06/02/23 2212  BP: 123/75 118/84 115/80   Pulse: 92 83 99   Resp: 12 20 19 18   Temp:      TempSrc:      SpO2: 98% 99% 96%    WBC     Component Value Date/Time   WBC 12.5 (H) 06/02/2023 1810   LYMPHSABS 1.3 05/12/2023 1025   MONOABS 0.5 05/12/2023 1025   EOSABS 0.0 05/12/2023 1025   BASOSABS 0.0 05/12/2023 1025        Lactic Acid, Venous    Component Value Date/Time   LATICACIDVEN 4.6 (HH) 06/02/2023 2047       UA ordered    Results for orders placed or performed during the hospital encounter of 06/02/23  Resp panel by RT-PCR (RSV, Flu A&B, Covid) Anterior Nasal Swab     Status: None   Collection Time: 06/02/23  6:10 PM   Specimen: Anterior Nasal Swab  Result Value Ref Range Status   SARS Coronavirus 2 by RT PCR NEGATIVE NEGATIVE Final   Influenza A by PCR NEGATIVE NEGATIVE Final   Influenza B by PCR NEGATIVE NEGATIVE Final         Resp Syncytial Virus by PCR NEGATIVE NEGATIVE Final        Blood culture (routine x 2)     Status: None (Preliminary result)   Collection Time: 06/02/23  8:45 PM   Specimen: BLOOD RIGHT WRIST  Result Value Ref Range Status   Specimen Description BLOOD RIGHT WRIST  Final   Special Requests   Final    BOTTLES DRAWN AEROBIC AND ANAEROBIC Blood Culture results may not be optimal due to an inadequate volume of blood received in culture bottles Performed at [facility], [address], [place], [place] [address]    Culture PENDING  Incomplete   Report Status PENDING  Incomplete     ____________________________________________________________________ Recent Labs  Lab 06/02/23 1810  NA 142  K 3.6  CO2 25  GLUCOSE 108*  BUN 18  CREATININE 0.91  CALCIUM 9.6    Cr   stable,  Lab Results  Component Value Date   CREATININE 0.91 06/02/2023   CREATININE 1.26 (H) 05/12/2023   CREATININE 0.92 04/10/2023    No results for input(s): "AST", "ALT", "ALKPHOS", "BILITOT", "PROT", "ALBUMIN" in the last 168 hours. Lab Results  Component Value Date   CALCIUM 9.6 06/02/2023   PHOS 2.3 (L) 02/21/2023          Plt: Lab Results  Component Value Date  PLT 231 06/02/2023     Recent Labs  Lab 06/02/23 1810  WBC 12.5*  HGB 14.0  HCT 43.4  MCV 85.4  PLT 231    HG/HCT  stable,     Component Value Date/Time   HGB 14.0 06/02/2023 1810   HCT 43.4 06/02/2023 1810   MCV 85.4 06/02/2023 1810   No results for input(s): "LIPASE", "AMYLASE" in the last 168  hours. Recent Labs  Lab 06/02/23 2045  AMMONIA 27      .lab  _______________________________________________ Hospitalist was called for admission for *** There are no diagnoses linked to this encounter.   The following Work up has been ordered so far:  Orders Placed This Encounter  Procedures  . Resp panel by RT-PCR (RSV, Flu A&B, Covid) Anterior Nasal Swab  . Blood culture (routine x 2)  . CT Head Wo Contrast  . DG Chest Portable 1 View  . CT Angio Chest PE W and/or Wo Contrast  . CT ABDOMEN PELVIS W CONTRAST  . CT ANGIO HEAD NECK W WO CM  . CBC  . Basic metabolic panel  . Brain natriuretic peptide  . Urinalysis, Routine w reflex microscopic -Urine, Clean Catch  . Ammonia  . Protime-INR  . APTT  . Insert foley catheter  . Inpatient consult to Cardiology  . Consult to hospitalist  . I-Stat CG4 Lactic Acid  . ED EKG  . EKG 12-Lead  . EKG 12-Lead     OTHER Significant initial  Findings:  labs showing:     DM  labs:  HbA1C: No results for input(s): "HGBA1C" in the last 8760 hours.     CBG (last 3)  No results for input(s): "GLUCAP" in the last 72 hours.        Cultures:    Component Value Date/Time   SDES BLOOD RIGHT WRIST 06/02/2023 2045   SPECREQUEST  06/02/2023 2045    BOTTLES DRAWN AEROBIC AND ANAEROBIC Blood Culture results may not be optimal due to an inadequate volume of blood received in culture bottles Performed at [facility], [address] [place], [place] [address]    CULT PENDING 06/02/2023 2045   REPTSTATUS PENDING 06/02/2023 2045     Radiological Exams on Admission: CT Angio Chest PE W and/or Wo Contrast Result Date: 06/02/2023 CLINICAL DATA:  Altered mental status and sepsis, high probability of pulmonary embolism. EXAM: CT ANGIOGRAPHY CHEST CT ABDOMEN AND PELVIS WITH CONTRAST TECHNIQUE: Multidetector CT imaging of the chest was performed using the standard protocol during bolus administration of intravenous contrast.  Multiplanar CT image reconstructions and MIPs were obtained to evaluate the vascular anatomy. Multidetector CT imaging of the abdomen and pelvis was performed using the standard protocol during bolus administration of intravenous contrast. RADIATION DOSE REDUCTION: This exam was performed according to the departmental dose-optimization program which includes automated exposure control, adjustment of the mA and/or kV according to patient size and/or use of iterative reconstruction technique. CONTRAST:  75mL OMNIPAQUE IOHEXOL 350 MG/ML SOLN COMPARISON:  Portable chest today, AP Lat chest 04/10/2023, CT abdomen and pelvis with contrast 12/10/2021, and the report of a CTA chest 02/28/2008, images for which are no longer available in PACS. FINDINGS: CTA CHEST FINDINGS Cardiovascular: The pulmonary arteries are normal in caliber. No arterial embolus is seen through the segmental divisions. The subsegmental arterial bed is largely not evaluated due to obscuration by respiratory motion. The cardiac size is normal. There is no pericardial effusion. There trace single-vessel calcifications in the LAD coronary  artery. There is mild aortic atherosclerosis and tortuosity. Aortic opacification just insufficient to assess for dissection. Pulmonary veins are nondistended. Mediastinum/Nodes: No enlarged mediastinal, hilar, or axillary lymph nodes. Thyroid gland and esophagus demonstrate no significant findings. There are retained secretions in the proximal trachea. Both main bronchi are clear. Lungs/Pleura: Segmental and subsegmental mucous plugging noted in the posterior basal lower lobes, mild subsegmental atelectasis in the posterior lower lobes. There are trace pleural effusions. No consolidation or nodules are seen through the breathing motion. Musculoskeletal: No chest wall mass is seen. 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 Mild thoracic kyphosis. No acute or significant osseous  findings. Review of the MIP images confirms the above findings. CT ABDOMEN and PELVIS FINDINGS Hepatobiliary: Stable 5 mm too small to characterize hypodensity in the dome of segment 8, probably a small cyst. The liver parenchyma is otherwise unremarkable. Gallbladder and bile ducts are unremarkable. There is suspicion of a small nonocclusive thrombus in the proximal right hepatic vein on 4: 12-14 and 9:78. Rest of the hepatic veins and the portal venous structures opacify normally. Pancreas: There are occasional pancreatic parenchymal calcifications consistent with chronic calcific pancreatitis. No acute pancreatic inflammatory changes or mass enhancement are seen, no ductal dilatation. Spleen: No abnormality. Adrenals/Urinary Tract: There is no adrenal mass. Right kidney is unremarkable. There is a stable 1 cm Bosniak 1 cyst in the superior pole of the left kidney medially, Hounsfield density is 9. There is a stable 7 mm too small to characterize Bosniak 2 cyst in the outer midpole left kidney. There is no mass enhancement. There is no urinary stone or obstruction. Both kidneys excrete symmetrically on the delayed images. There is impression on the bladder by the enlarged prostate. There is mild bladder thickening versus underdistention or hypertrophy. Correlate with urinalysis for possible significance. Stomach/Bowel: Unremarkable stomach and unopacified small bowel. Normal caliber appendix. Scattered sigmoid diverticula without evidence of diverticulitis. Large stool accumulation distal sigmoid colon and rectum is again noted and slight perirectal fat stranding which could be from dependent congestive changes or stercoral proctitis. There is no overt wall thickening or pneumatosis. Vascular/Lymphatic: As above there is suspicion for a small nonocclusive thrombus in the right hepatic vein. There is mild aortic atherosclerosis without AAA or dissection. No other significant vascular findings. No lymphadenopathy is  seen. Reproductive: Moderate to severe prostatomegaly, transverse axis 6.2 cm, purposes 6.4 cm, with bladder impression. Other: Small umbilical and left inguinal fat hernias. No incarcerated hernia. There is trace presacral ascites. There is no free hemorrhage, free air, or abscess. Musculoskeletal: There is disc collapse and interbody and facet joint ankylosis at L5-S1, multilevel lumbar bridging osteophytes. Advanced facet hypertrophy L3-4 and L4-5, with acquired spinal stenosis L4-5. There is ankylosis across the right SI joint. Mild hip DJD. Review of the MIP images confirms the above findings. IMPRESSION: 1. No evidence of pulmonary arterial embolus through the segmental divisions. The subsegmental arterial bed is largely not evaluated due to obscuration by respiratory motion. 2. Segmental and subsegmental mucous plugging in the posterior basal lower lobes with mild subsegmental atelectasis. Trace pleural effusions. 3. Aortic and coronary artery atherosclerosis. 4. Suspicion of a small nonocclusive thrombus in the proximal right hepatic vein. 5. Large stool accumulation in the distal sigmoid colon and rectum with slight perirectal fat stranding, which could be from dependent congestive changes or stercoral proctitis. 6. Diverticulosis without evidence of diverticulitis. 7. Moderate to severe prostatomegaly with bladder impression. 8. Mild bladder thickening versus underdistention or hypertrophy.  Correlate with urinalysis for possible significance. 9. Chronic calcific pancreatitis. No evidence of acute pancreatitis. 10. Umbilical and left inguinal fat hernias. Degenerative and DISH changes. Aortic Atherosclerosis (ICD10-I70.0). Electronically Signed   By: Almira Bar M.D.   On: 06/02/2023 21:22   CT ABDOMEN PELVIS W CONTRAST Result Date: 06/02/2023 CLINICAL DATA:  Altered mental status and sepsis, high probability of pulmonary embolism. EXAM: CT ANGIOGRAPHY CHEST CT ABDOMEN AND PELVIS WITH CONTRAST  TECHNIQUE: Multidetector CT imaging of the chest was performed using the standard protocol during bolus administration of intravenous contrast. Multiplanar CT image reconstructions and MIPs were obtained to evaluate the vascular anatomy. Multidetector CT imaging of the abdomen and pelvis was performed using the standard protocol during bolus administration of intravenous contrast. RADIATION DOSE REDUCTION: This exam was performed according to the departmental dose-optimization program which includes automated exposure control, adjustment of the mA and/or kV according to patient size and/or use of iterative reconstruction technique. CONTRAST:  75mL OMNIPAQUE IOHEXOL 350 MG/ML SOLN COMPARISON:  Portable chest today, AP Lat chest 04/10/2023, CT abdomen and pelvis with contrast 12/10/2021, and the report of a CTA chest 02/28/2008, images for which are no longer available in PACS. FINDINGS: CTA CHEST FINDINGS Cardiovascular: The pulmonary arteries are normal in caliber. No arterial embolus is seen through the segmental divisions. The subsegmental arterial bed is largely not evaluated due to obscuration by respiratory motion. The cardiac size is normal. There is no pericardial effusion. There trace single-vessel calcifications in the LAD coronary artery. There is mild aortic atherosclerosis and tortuosity. Aortic opacification just insufficient to assess for dissection. Pulmonary veins are nondistended. Mediastinum/Nodes: No enlarged mediastinal, hilar, or axillary lymph nodes. Thyroid gland and esophagus demonstrate no significant findings. There are retained secretions in the proximal trachea. Both main bronchi are clear. Lungs/Pleura: Segmental and subsegmental mucous plugging noted in the posterior basal lower lobes, mild subsegmental atelectasis in the posterior lower lobes. There are trace pleural effusions. No consolidation or nodules are seen through the breathing motion. Musculoskeletal: No chest wall mass is  seen. 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 Mild thoracic kyphosis. No acute or significant osseous findings. Review of the MIP images confirms the above findings. CT ABDOMEN and PELVIS FINDINGS Hepatobiliary: Stable 5 mm too small to characterize hypodensity in the dome of segment 8, probably a small cyst. The liver parenchyma is otherwise unremarkable. Gallbladder and bile ducts are unremarkable. There is suspicion of a small nonocclusive thrombus in the proximal right hepatic vein on 4: 12-14 and 9:78. Rest of the hepatic veins and the portal venous structures opacify normally. Pancreas: There are occasional pancreatic parenchymal calcifications consistent with chronic calcific pancreatitis. No acute pancreatic inflammatory changes or mass enhancement are seen, no ductal dilatation. Spleen: No abnormality. Adrenals/Urinary Tract: There is no adrenal mass. Right kidney is unremarkable. There is a stable 1 cm Bosniak 1 cyst in the superior pole of the left kidney medially, Hounsfield density is 9. There is a stable 7 mm too small to characterize Bosniak 2 cyst in the outer midpole left kidney. There is no mass enhancement. There is no urinary stone or obstruction. Both kidneys excrete symmetrically on the delayed images. There is impression on the bladder by the enlarged prostate. There is mild bladder thickening versus underdistention or hypertrophy. Correlate with urinalysis for possible significance. Stomach/Bowel: Unremarkable stomach and unopacified small bowel. Normal caliber appendix. Scattered sigmoid diverticula without evidence of diverticulitis. Large stool accumulation distal sigmoid colon and rectum is again noted and  slight perirectal fat stranding which could be from dependent congestive changes or stercoral proctitis. There is no overt wall thickening or pneumatosis. Vascular/Lymphatic: As above there is suspicion for a small nonocclusive thrombus in the  right hepatic vein. There is mild aortic atherosclerosis without AAA or dissection. No other significant vascular findings. No lymphadenopathy is seen. Reproductive: Moderate to severe prostatomegaly, transverse axis 6.2 cm, purposes 6.4 cm, with bladder impression. Other: Small umbilical and left inguinal fat hernias. No incarcerated hernia. There is trace presacral ascites. There is no free hemorrhage, free air, or abscess. Musculoskeletal: There is disc collapse and interbody and facet joint ankylosis at L5-S1, multilevel lumbar bridging osteophytes. Advanced facet hypertrophy L3-4 and L4-5, with acquired spinal stenosis L4-5. There is ankylosis across the right SI joint. Mild hip DJD. Review of the MIP images confirms the above findings. IMPRESSION: 1. No evidence of pulmonary arterial embolus through the segmental divisions. The subsegmental arterial bed is largely not evaluated due to obscuration by respiratory motion. 2. Segmental and subsegmental mucous plugging in the posterior basal lower lobes with mild subsegmental atelectasis. Trace pleural effusions. 3. Aortic and coronary artery atherosclerosis. 4. Suspicion of a small nonocclusive thrombus in the proximal right hepatic vein. 5. Large stool accumulation in the distal sigmoid colon and rectum with slight perirectal fat stranding, which could be from dependent congestive changes or stercoral proctitis. 6. Diverticulosis without evidence of diverticulitis. 7. Moderate to severe prostatomegaly with bladder impression. 8. Mild bladder thickening versus underdistention or hypertrophy. Correlate with urinalysis for possible significance. 9. Chronic calcific pancreatitis. No evidence of acute pancreatitis. 10. Umbilical and left inguinal fat hernias. Degenerative and DISH changes. Aortic Atherosclerosis (ICD10-I70.0). Electronically Signed   By: Almira Bar M.D.   On: 06/02/2023 21:22   CT ANGIO HEAD NECK W WO CM Result Date: 06/02/2023 CLINICAL DATA:   Initial evaluation for acute stroke. EXAM: CT ANGIOGRAPHY HEAD AND NECK WITH AND WITHOUT CONTRAST TECHNIQUE: Multidetector CT imaging of the head and neck was performed using the standard protocol during bolus administration of intravenous contrast. Multiplanar CT image reconstructions and MIPs were obtained to evaluate the vascular anatomy. Carotid stenosis measurements (when applicable) are obtained utilizing NASCET criteria, using the distal internal carotid diameter as the denominator. RADIATION DOSE REDUCTION: This exam was performed according to the departmental dose-optimization program which includes automated exposure control, adjustment of the mA and/or kV according to patient size and/or use of iterative reconstruction technique. CONTRAST:  75mL OMNIPAQUE IOHEXOL 350 MG/ML SOLN COMPARISON:  Prior CT from earlier the same day. FINDINGS: CTA NECK FINDINGS Aortic arch: Visualized aortic arch within normal limits for caliber. Bovine branching pattern noted. Mild aortic atherosclerosis. No significant stenosis about the origin of the great vessels. Right carotid system: Right common and internal carotid arteries are patent without dissection. Mild atheromatous change about the right carotid bulb without hemodynamically significant greater than 50% stenosis. Left carotid system: Left CCA patent from its origin to the bifurcation. 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 Left ICA remains occluded to the skull base. Vertebral arteries: Both vertebral arteries arise from subclavian arteries. No proximal subclavian artery stenosis. Right vertebral artery slightly dominant. Vertebral arteries are patent without stenosis or dissection. Skeleton: No discrete or worrisome osseous lesions. Moderate spondylosis at C3-4 through C5-6. Other neck: No other acute finding. Upper chest: Layering secretions noted within the subglottic airway, suggesting at this patient is at  risk for aspiration. No other acute finding. Visualized  lungs are clear. Review of the MIP images confirms the above findings CTA HEAD FINDINGS Anterior circulation: The right ICA patent to the terminus without significant stenosis or other abnormality. Left ICA remains occluded to the terminus. A1 segments patent, with filling of the left A1 via collateral flow across the circle-of-Willis. Normal anterior communicating artery complex. Both ACAs patent without significant stenosis. Right M1 segment and its distal branches are patent and well perfused. Collateral filling of the left M1 segment the of the circle-of-Willis and possibly the left PCOM. Probable subocclusive thrombus within the left M1 segment (series 7, images 91, 92). Some perfusion seen within the left MCA branches distally. Posterior circulation: Both V4 segments patent without significant stenosis. Left PICA patent. Right PICA not well seen. Basilar diminutive but patent without stenosis. Superior cerebral arteries patent bilaterally. Predominant fetal type origin of the PCAs bilaterally, with partial occlusion of the proximal left PCOM (series 6, image 341). Left PCA patent distally and perfused to its distal aspect. Right PCA widely patent without stenosis. Venous sinuses: Patent allowing for timing the contrast bolus. Anatomic variants: As above.  No visible aneurysm. Review of the MIP images confirms the above findings IMPRESSION: 1. 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 Attenuated perfusion of the left MCA via collateral flow across the circle-of-Willis. Probable subocclusive thrombus within the left M1 segment. 2. 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 Left PCA patent and perfused distally. 3. Mild atheromatous change about the right carotid bulb without hemodynamically significant stenosis.  4. Layering secretions within the subglottic airway, suggesting that this patient is at risk for aspiration. Aortic Atherosclerosis (ICD10-I70.0). These results were communicated to Dr. Wilford Corner at 8:18 pm on 06/02/2023 by text page via the [facility] messaging system. Electronically Signed   By: Rise Mu M.D.   On: 06/02/2023 21:14   CT Head Wo Contrast Result Date: 06/02/2023 CLINICAL DATA:  Initial evaluation for acute mental status change, unknown cause. EXAM: CT HEAD WITHOUT CONTRAST TECHNIQUE: Contiguous axial images were obtained from the base of the skull through the vertex without intravenous contrast. RADIATION DOSE REDUCTION: This exam was performed according to the departmental dose-optimization program which includes automated exposure control, adjustment of the mA and/or kV according to patient size and/or use of iterative reconstruction technique. COMPARISON:  CT from 05/12/2023 FINDINGS: Brain: Large area of evolving cytotoxic edema seen involving the left cerebral hemisphere, essentially involving the entirety of the left MCA distribution, are consistent with a large evolving left MCA territory infarct, subacute in appearance. Associated regional mass effect with effacement of the left lateral ventricle. Associated 11-12 mm of right-to-left shift. Mild asymmetric dilatation of the right lateral ventricle without overt trapping or transependymal flow of CSF. Basilar cisterns remain patent. Acute extra-axial hemorrhage overlying the left cerebral convexity measures up to 7 mm in thickness (series 2, image 17). No acute intraparenchymal hemorrhage. No mass lesion. Underlying atrophy with chronic small vessel ischemic disease noted. 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 No other Vascular: Hyperdense left M1 segment, likely occluded. Skull: Scalp soft tissues demonstrate no acute finding. Calvarium intact. Sinuses/Orbits: Globes and orbital soft tissues within normal  limits. Paranasal sinuses are clear. No mastoid effusion. Other: None. IMPRESSION: 1. 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 Associated regional mass effect with 11-12 mm of right-to-left shift. 2. Acute  extra-axial hemorrhage overlying the left cerebral convexity measuring up to 7 mm in thickness. 3. Hyperdense left M1 segment, concerning for occlusion. Correlation with dedicated CTA recommended. 4. Underlying atrophy with chronic small vessel ischemic disease, with chronic infarcts about the right basal ganglia and right thalamus. Critical results were communicated to Dr. Wilford Corner at 8:15 pm on 06/02/2023 by text page via the [facility] messaging system. Electronically Signed   By: Rise Mu M.D.   On: 06/02/2023 20:18   DG Chest Portable 1 View Result Date: 06/02/2023 CLINICAL DATA:  Sepsis EXAM: PORTABLE CHEST 1 VIEW COMPARISON:  Chest x-ray 04/10/2023 FINDINGS: The heart size and mediastinal contours are within normal limits. Both lungs are clear. The visualized skeletal structures are unremarkable. IMPRESSION: No active disease. Electronically Signed   By: Darliss Cheney M.D.   On: 06/02/2023 19:18   _______________________________________________________________________________________________________ Latest  Blood pressure 115/80, pulse 99, temperature 99.9 F (37.7 C), temperature source Rectal, resp. rate 18, SpO2 96%.   Vitals  labs and radiology finding personally reviewed  Review of Systems:    Pertinent positives include: ***  Constitutional:  No weight loss, night sweats, Fevers, chills, fatigue, weight loss  HEENT:  No headaches, Difficulty swallowing,Tooth/dental problems,Sore throat,  No sneezing, itching, ear ache, nasal congestion, post nasal drip,  Cardio-vascular:  No chest pain, Orthopnea, PND, anasarca, dizziness, palpitations.no Bilateral lower extremity swelling  GI:  No heartburn, indigestion, abdominal pain, nausea, vomiting, diarrhea,  change in bowel habits, loss of appetite, melena, blood in stool, hematemesis Resp:  no shortness of breath at rest. No dyspnea on exertion, No excess mucus, no productive cough, No non-productive cough, No coughing up of blood.No change in color of mucus.No wheezing. Skin:  no rash or lesions. No jaundice GU:  no dysuria, change in color of urine, no urgency or frequency. No straining to urinate.  No flank pain.  Musculoskeletal:  No joint pain or no joint swelling. No decreased range of motion. No back pain.  Psych:  No change in mood or affect. No depression or anxiety. No memory loss.  Neuro: no localizing neurological complaints, no tingling, no weakness, no double vision, no gait abnormality, no slurred speech, no confusion  All systems reviewed and apart from HOPI all are negative _______________________________________________________________________________________________ Past Medical History:   Past Medical History:  Diagnosis Date  . Dementia (HCC)   . Hyperlipemia   . TIA (transient ischemic attack)       No past surgical history on file.  Social History:  Ambulatory *** independently cane, walker  wheelchair bound, bed bound     reports that he has quit smoking. He has never used smokeless tobacco. No history on file for alcohol use and drug use.     Family History: *** Family History  Problem Relation Age of Onset  . Heart disease Neg Hx    ______________________________________________________________________________________________ Allergies: No Known Allergies   Prior to Admission medications   Medication Sig Start Date End Date Taking? Authorizing Provider  amLODipine (NORVASC) 5 MG tablet Take 5 mg by mouth daily.   Yes [provider]  atorvastatin (LIPITOR) 40 MG tablet Take 40 mg by mouth at bedtime. 02/18/23  Yes [provider]  calcium carbonate (TUMS - DOSED IN MG ELEMENTAL CALCIUM) 500 MG chewable tablet Chew 1 tablet  by mouth daily.   Yes [provider]  clopidogrel (PLAVIX) 75 MG tablet Take 75 mg by mouth in the morning.   Yes [provider]  levETIRAcetam (KEPPRA) 250  MG tablet Take 2 tablets (500 mg total) by mouth 2 (two) times daily. 05/12/23  Yes Derwood Kaplan, MD  Multiple Vitamin (MULTIVITAMIN WITH MINERALS) TABS tablet Take 1 tablet by mouth daily. Patient taking differently: Take 1 tablet by mouth in the morning. 12/18/21  Yes Standley Brooking, MD  polyethylene glycol (MIRALAX / GLYCOLAX) 17 g packet Take 17 g by mouth daily.   Yes [provider]  sennosides-docusate sodium (SENOKOT-S) 8.6-50 MG tablet Take 2 tablets by mouth in the morning.   Yes [provider]  Vitamin D, Ergocalciferol, (DRISDOL) 1.25 MG (50000 UNIT) CAPS capsule Take 50,000 Units by mouth every Monday.   Yes [provider]    ___________________________________________________________________________________________________ Physical Exam:    06/02/2023   10:00 PM 06/02/2023    7:45 PM 06/02/2023    7:30 PM  Vitals with BMI  Systolic 115 118 161  Diastolic 80 84 75  Pulse 99 83 92     1. General:  in No ***Acute distress***increased work of breathing ***complaining of severe pain****agitated * Chronically ill *well *cachectic *toxic acutely ill -appearing 2. Psychological: Alert and *** Oriented 3. Head/ENT:   Moist *** Dry Mucous Membranes                          Head Non traumatic, neck supple                          Normal *** Poor Dentition 4. SKIN: normal *** decreased Skin turgor,  Skin clean Dry and intact no rash    5. Heart: Regular rate and rhythm no*** Murmur, no Rub or gallop 6. Lungs: ***Clear to auscultation bilaterally, no wheezes or crackles   7. Abdomen: Soft, ***non-tender, Non distended *** obese ***bowel sounds present 8. Lower extremities: no clubbing, cyanosis, no ***edema 9. Neurologically Grossly intact, moving all 4 extremities equally ***  strength 5 out of 5 in all 4 extremities cranial nerves II through XII intact 10. MSK: Normal range of motion    Chart has been reviewed  ______________________________________________________________________________________________  Assessment/Plan  ***  Admitted for *** There are no diagnoses linked to this encounter.   Present on Admission: **None**     No problem-specific Assessment & Plan notes found for this encounter.    Other plan as per orders.  DVT prophylaxis:  SCD *** Lovenox       Code Status:    Code Status: Prior FULL CODE *** DNR/DNI ***comfort care as per patient ***family  I had personally discussed CODE STATUS with patient and family*  ACP *** none has been reviewed ***   Family Communication:   Family not at  Bedside  plan of care was discussed on the phone with *** Son, Daughter, Wife, Husband, Sister, Brother , father, mother  Diet    Disposition Plan:   *** likely will need placement for rehabilitation                          Back to current facility when stable                            To home once workup is complete and patient is stable  ***Following barriers for discharge:  Chest pain *** Stroke *** work up is complete                            Electrolytes corrected                               Anemia corrected h/H stable                             Pain controlled with PO medications                               Afebrile, white count improving able to transition to PO antibiotics                             Will need to be able to tolerate PO                            Will likely need home health, home O2, set up                           Will need consultants to evaluate patient prior to discharge       Consult Orders  (From admission, onward)           Start     Ordered   06/02/23 2205  Consult to hospitalist  Pg by Viviann Spare  Once       Provider:  (Not yet assigned)  Question Answer Comment   Place call to: [facility]   Reason for Consult Admit      06/02/23 2204                              ***Would benefit from PT/OT eval prior to DC  Ordered                   Swallow eval - SLP ordered                   Diabetes care coordinator                   Transition of care consulted                   Nutrition    consulted                  Wound care  consulted                   Palliative care    consulted                   Behavioral health  consulted                    Consults called: ***     Admission status:  ED Disposition     ED Disposition  Admit   Condition  --   Comment  The patient appears reasonably stabilized for admission considering the current resources, flow, and capabilities available in the ED at this time, and I doubt any other [facility] requiring  further screening and/or treatment in the ED prior to admission is  present.           Obs***  ***  inpatient     I Expect 2 midnight stay secondary to severity of patient's current illness need for inpatient interventions justified by the following: ***hemodynamic instability despite optimal treatment (tachycardia *hypotension * tachypnea *hypoxia, hypercapnia) * Severe lab/radiological/exam abnormalities including:     and extensive comorbidities including: *substance abuse  *Chronic pain *DM2  * CHF * CAD  * COPD/asthma *Morbid Obesity * CKD *dementia *liver disease *history of stroke with residual deficits *  malignancy, * sickle cell disease  History of amputation Chronic anticoagulation  That are currently affecting medical management.   I expect  patient to be hospitalized for 2 midnights requiring inpatient medical care.  Patient is at high risk for adverse outcome (such as loss of life or disability) if not treated.  Indication for inpatient stay as follows:  Severe change from baseline regarding mental status Hemodynamic instability despite maximal medical  therapy,  ongoing suicidal ideations,  severe pain requiring acute inpatient management,  inability to maintain oral hydration   persistent chest pain despite medical management Need for operative/procedural  intervention New or worsening hypoxia   Need for IV antibiotics, IV fluids, IV rate controling medications, IV antihypertensives, IV pain medications, IV anticoagulation, need for biPAP    Level of care   *** tele  For 12H 24H     medical floor       progressive     stepdown   tele indefinitely please discontinue once patient no longer qualifies COVID-19 Labs    Lab Results  Component Value Date   SARSCOV2NAA NEGATIVE 06/02/2023     Precautions: admitted as *** Covid Negative  ***asymptomatic screening protocol****PUI *** covid positive No active isolations ***If Covid PCR is negative  - please DC precautions - would need additional investigation given very high risk for false native test result    Critical***  Patient is critically ill due to  hemodynamic instability * respiratory failure *severe sepsis* ongoing chest pain*  They are at high risk for life/limb threatening clinical deterioration requiring frequent reassessment and modifications of care.  Services provided include examination of the patient, review of relevant ancillary tests, prescription of lifesaving therapies, review of medications and prophylactic therapy.  Total critical care time excluding separately billable procedures: 60*  Minutes.    Graci Hulce 06/02/2023, 10:30 PM ***  [facility]     after 2 AM please page floor coverage PA If 7AM-7PM, please contact the day team taking care of the patient using [email or web address]

## 2023-06-02 NOTE — Assessment & Plan Note (Signed)
Patient is comfort care at this time hold off on additional blood work

## 2023-06-02 NOTE — Assessment & Plan Note (Signed)
Patient unable to tolerate p.o. at this time Comfort care would not be a candidate for aggressive blood pressure management assessment completed with comfort goals

## 2023-06-02 NOTE — ED Notes (Signed)
Family has arrived to bedside, Dr. Wilford Corner with neurologist is aware and Al Decant, PA-C is at the bedside to speak with family

## 2023-06-02 NOTE — Assessment & Plan Note (Signed)
 Chronic-stable.

## 2023-06-03 ENCOUNTER — Other Ambulatory Visit: Payer: Self-pay

## 2023-06-03 ENCOUNTER — Encounter (HOSPITAL_COMMUNITY): Payer: Self-pay | Admitting: Internal Medicine

## 2023-06-03 DIAGNOSIS — E872 Acidosis, unspecified: Secondary | ICD-10-CM | POA: Diagnosis not present

## 2023-06-03 DIAGNOSIS — Z515 Encounter for palliative care: Secondary | ICD-10-CM | POA: Diagnosis not present

## 2023-06-03 DIAGNOSIS — K5900 Constipation, unspecified: Secondary | ICD-10-CM

## 2023-06-03 DIAGNOSIS — I639 Cerebral infarction, unspecified: Secondary | ICD-10-CM | POA: Diagnosis not present

## 2023-06-03 DIAGNOSIS — K5289 Other specified noninfective gastroenteritis and colitis: Secondary | ICD-10-CM | POA: Diagnosis not present

## 2023-06-03 DIAGNOSIS — Z7189 Other specified counseling: Secondary | ICD-10-CM | POA: Diagnosis not present

## 2023-06-03 DIAGNOSIS — I63512 Cerebral infarction due to unspecified occlusion or stenosis of left middle cerebral artery: Secondary | ICD-10-CM | POA: Diagnosis not present

## 2023-06-03 LAB — BLOOD CULTURE ID PANEL (REFLEXED) - BCID2

## 2023-06-03 LAB — LIPID PANEL
Cholesterol: 72 mg/dL (ref 0–200)
HDL: 32 mg/dL — ABNORMAL LOW (ref >40)
LDL Cholesterol: 34 mg/dL (ref 0–99)
Total CHOL/HDL Ratio: 2.3 {ratio}
Triglycerides: 32 mg/dL (ref <150)
VLDL: 6 mg/dL (ref 0–40)

## 2023-06-03 MED ORDER — ONDANSETRON 4 MG PO TBDP: 4.0000 mg | ORAL_TABLET | Freq: Four times a day (QID) | ORAL | Status: DC | PRN

## 2023-06-03 MED ORDER — ACETAMINOPHEN 325 MG PO TABS: 650.0000 mg | ORAL_TABLET | Freq: Four times a day (QID) | ORAL | Status: DC | PRN

## 2023-06-03 MED ORDER — POLYVINYL ALCOHOL 1.4 % OP SOLN
1.0000 [drp] | Freq: Four times a day (QID) | OPHTHALMIC | Status: DC | PRN
  Administered 2023-06-04: 1 [drp] via OPHTHALMIC
  Filled 2023-06-03: qty 15

## 2023-06-03 MED ORDER — ACETAMINOPHEN 650 MG RE SUPP: 650.0000 mg | RECTAL | Status: DC | PRN

## 2023-06-03 MED ORDER — SODIUM CHLORIDE 0.9% FLUSH
3.0000 mL | Freq: Two times a day (BID) | INTRAVENOUS | Status: DC
  Administered 2023-06-03 – 2023-06-04 (×4): 3 mL via INTRAVENOUS

## 2023-06-03 MED ORDER — MORPHINE SULFATE (PF) 2 MG/ML IV SOLN
1.0000 mg | INTRAVENOUS | Status: DC | PRN
  Administered 2023-06-04: 1 mg via INTRAVENOUS
  Filled 2023-06-03: qty 1

## 2023-06-03 MED ORDER — BIOTENE DRY MOUTH MT LIQD: 15.0000 mL | OROMUCOSAL | Status: DC | PRN

## 2023-06-03 MED ORDER — SODIUM CHLORIDE 0.9% FLUSH: 3.0000 mL | INTRAVENOUS | Status: DC | PRN

## 2023-06-03 MED ORDER — GLYCOPYRROLATE 1 MG PO TABS: 1.0000 mg | ORAL_TABLET | ORAL | Status: DC | PRN

## 2023-06-03 MED ORDER — MORPHINE SULFATE (PF) 2 MG/ML IV SOLN
1.0000 mg | Freq: Three times a day (TID) | INTRAVENOUS | Status: DC
  Administered 2023-06-03 – 2023-06-04 (×4): 1 mg via INTRAVENOUS
  Filled 2023-06-03 (×4): qty 1

## 2023-06-03 MED ORDER — LEVETIRACETAM IN NACL 500 MG/100ML IV SOLN
500.0000 mg | Freq: Two times a day (BID) | INTRAVENOUS | Status: DC
  Administered 2023-06-03 – 2023-06-04 (×3): 500 mg via INTRAVENOUS
  Filled 2023-06-03 (×3): qty 100

## 2023-06-03 MED ORDER — GLYCOPYRROLATE 0.2 MG/ML IJ SOLN: 0.2000 mg | INTRAMUSCULAR | Status: DC | PRN

## 2023-06-03 MED ORDER — ONDANSETRON HCL 4 MG/2ML IJ SOLN: 4.0000 mg | Freq: Four times a day (QID) | INTRAMUSCULAR | Status: DC | PRN

## 2023-06-03 MED ORDER — ACETAMINOPHEN 160 MG/5ML PO SOLN: 650.0000 mg | ORAL | Status: DC | PRN

## 2023-06-03 MED ORDER — SODIUM CHLORIDE 0.9 % IV SOLN: 250.0000 mL | INTRAVENOUS | Status: AC | PRN

## 2023-06-03 MED ORDER — GLYCERIN (LAXATIVE) 2 G RE SUPP
1.0000 | Freq: Once | RECTAL | Status: AC
  Administered 2023-06-03: 1 via RECTAL
  Filled 2023-06-03: qty 1

## 2023-06-03 MED ORDER — ACETAMINOPHEN 325 MG PO TABS: 650.0000 mg | ORAL_TABLET | ORAL | Status: DC | PRN

## 2023-06-03 MED ORDER — ACETAMINOPHEN 650 MG RE SUPP
650.0000 mg | Freq: Four times a day (QID) | RECTAL | Status: DC | PRN
Start: 2023-06-03 — End: 2023-06-04

## 2023-06-03 MED ORDER — STROKE: EARLY STAGES OF RECOVERY BOOK: Freq: Once | Status: DC

## 2023-06-03 NOTE — Progress Notes (Signed)
 PHARMACY - PHYSICIAN COMMUNICATION CRITICAL VALUE ALERT - BLOOD CULTURE IDENTIFICATION (BCID)  Jeff Wilson is an 83 y.o. male who presented to [facility] on 06/02/2023 with a chief complaint of AMS.  Assessment: Blood culture growing Staph spp in 1 of 4 bottles, no resistance.  Could be a contaminant.    Name of physician (or Provider) Contacted: Dr. Sundil  Current antibiotics: none  Changes to prescribed antibiotics recommended:  Hospice patient; no intervention needed  Results for orders placed or performed during the hospital encounter of 06/02/23  Blood Culture ID Panel (Reflexed) (Collected: 06/02/2023  6:10 PM)  Result Value Ref Range   Enterococcus faecalis NOT DETECTED NOT DETECTED   Enterococcus Faecium NOT DETECTED NOT DETECTED   Listeria monocytogenes NOT DETECTED NOT DETECTED   Staphylococcus species DETECTED (A) NOT DETECTED   Staphylococcus aureus (BCID) NOT DETECTED NOT DETECTED   Staphylococcus epidermidis NOT DETECTED NOT DETECTED   Staphylococcus lugdunensis NOT DETECTED NOT DETECTED   Streptococcus species NOT DETECTED NOT DETECTED   Streptococcus agalactiae NOT DETECTED NOT DETECTED   Streptococcus pneumoniae NOT DETECTED NOT DETECTED   Streptococcus pyogenes NOT DETECTED NOT DETECTED   A.calcoaceticus-baumannii NOT DETECTED NOT DETECTED   Bacteroides fragilis NOT DETECTED NOT DETECTED   Enterobacterales NOT DETECTED NOT DETECTED   Enterobacter cloacae complex NOT DETECTED NOT DETECTED   Escherichia coli NOT DETECTED NOT DETECTED   Klebsiella aerogenes NOT DETECTED NOT DETECTED   Klebsiella oxytoca NOT DETECTED NOT DETECTED   Klebsiella pneumoniae NOT DETECTED NOT DETECTED   Proteus species NOT DETECTED NOT DETECTED   Salmonella species NOT DETECTED NOT DETECTED   Serratia marcescens NOT DETECTED NOT DETECTED   Haemophilus influenzae NOT DETECTED NOT DETECTED   Neisseria meningitidis NOT DETECTED NOT DETECTED   Pseudomonas aeruginosa NOT DETECTED NOT  DETECTED   Stenotrophomonas maltophilia NOT DETECTED NOT DETECTED   Candida albicans NOT DETECTED NOT DETECTED   Candida auris NOT DETECTED NOT DETECTED   Candida glabrata NOT DETECTED NOT DETECTED   Candida krusei NOT DETECTED NOT DETECTED   Candida parapsilosis NOT DETECTED NOT DETECTED   Candida tropicalis NOT DETECTED NOT DETECTED   Cryptococcus neoformans/gattii NOT DETECTED NOT DETECTED    Sophiea Ueda D. Lendell, PharmD, BCPS, BCCCP 06/03/2023, 8:39 PM

## 2023-06-03 NOTE — Progress Notes (Signed)
   Referral received for inpatient hospice care at the [facility]). Patient has been approved for services by our physician. Have left voicemail for niece, April, requesting return call to discuss potential transfer. Will continue efforts.  Please reach out to me with questions.  Rolland Cordia, [facility] [phone]

## 2023-06-03 NOTE — Plan of Care (Signed)
Patient has transitioned to comfort care. Please call us for any questions and concerns.   Elmer Picker, DNP, FNP-BC [facility] Pager: [phone]

## 2023-06-03 NOTE — Progress Notes (Addendum)
 PROGRESS NOTE    Jeff Wilson  FMW:[id number] DOB: Aug 31, 1940 DOA: 06/02/2023 PCP: Feliciano Devoria LABOR, MD    No chief complaint on file.   Brief Narrative:  Patient is a 83 year old gentleman with history of dementia resident nursing facility, hyperlipidemia, hypertension, prior history of seizures diagnosed December 2024 in the setting of dementia with negative EEG, history of CVA, history of TIA presented from nursing facility with new facial droop, right hemiplegia with forced leftward gaze and decreased responsiveness.  Patient noted to be in A-fib with RVR with heart rates in the 130s.  Head CT done showed large completed left hemispheric infarct, CT angiogram head and neck showed left ICA occlusion.  Patient also noted to have elevated troponins.  Elevated lactic acid level.  Neurology consulted who assessed patient, recommended no antiplatelets or anticoagulants given bleed, repeat head CT in 6 to 8 hours, patient noted to be poor prognosis and recommended palliative care consultation.  Hospitalist discussed with family and decision made to pursue full comfort measures.  Palliative care consulted.   Assessment & Plan:   Principal Problem:   Stroke [facility]) Active Problems:   Stercoral colitis   Lactic acidosis   Essential hypertension   Mixed hyperlipidemia   Dementia without behavioral disturbance (HCC)   Acute ischemic stroke (HCC)   Elevated troponin   Hepatic vein thrombosis (HCC)   History of seizure disorder   Constipation  #1 subacute large left MCA territory infarct/extra-axial hemorrhage overlying left cerebral convexity up to 7 mm in thickness -Noted on head CT on admission. -CT angiogram head and neck done concerning for acute left ICA occlusion. -Patient noted to be in A-fib with RVR on presentation and felt stroke likely cardioembolic in nature. -Patient seen in consultation by neurology noted that patient with a poor baseline prior to stroke and due to large stroke  from a neurological recovery standpoint it was felt by neurology that her prognosis was going to be extremely poor. -Admitting physician discussed with family, also discussed with family and decision made to transition to full comfort measures. -Palliative care consulted.  2.  Constipation/stercoral colitis -Patient currently under comfort measures. -Glycerin  suppository x 1. -Dulcolax suppository daily.  3.  Lactic acidosis -Patient currently on the full comfort measures and as such we will hold off on further evaluation.  4.  Hypertension -Patient currently under comfort measures and likely not a good candidate for aggressive blood pressure management.  5.  Hyperlipidemia -Patient minimally responsive. -Patient currently full comfort measures.  6.  Elevated troponin -In the setting of large severe stroke. -Patient minimally responsive. -Patient now full comfort measures. -No further workup indicated at this time.  7.  Hepatic vein thrombosis -Patient with a intracranial hemorrhage and as such not a candidate for anticoagulation. -Patient currently for comfort measures.  8.  History of seizure disorder -IV Keppra . -Patient under full comfort measures.    DVT prophylaxis: Comfort measures. Code Status: DNR Family Communication: Updated niece on the telephone April Watkins.  Updated son Theodore Virgin ([phone])-nw the phone who states HCPOA is his sister Beula Key [phone]) Disposition: Residential hospice home versus in-hospital death  Status is: Inpatient Remains inpatient appropriate because: Severity of illness   Consultants:  Neurology: Dr.Arora Palliative care   Procedures:  CT head 06/02/2023 CT angiogram head and neck 06/02/2023 CT angiogram chest 06/02/2023 CT abdomen and pelvis 06/02/2023 Chest x-ray 06/02/2023  Antimicrobials:  Anti-infectives (From admission, onward)    None  Subjective: Patient minimally responsive laying on  gurney in the ED.  Left facial droop.  Patient full comfort.  Objective: Vitals:   06/03/23 0900 06/03/23 1207 06/03/23 1317 06/03/23 1525  BP: 111/80 115/77 106/74 118/89  Pulse: 95 94 91   Resp: (!) 22 (!) 23 (!) 21   Temp:  99 F (37.2 C) 99 F (37.2 C) 98.6 F (37 C)  TempSrc:  Oral Oral   SpO2: 96%  97% 98%    Intake/Output Summary (Last 24 hours) at 06/03/2023 1542 Last data filed at 06/02/2023 2308 Gross per 24 hour  Intake --  Output 100 ml  Net -100 ml   There were no vitals filed for this visit.  Examination:  General exam: Minimally responsive.  Left facial droop. Respiratory system: Clear to auscultation anterior lung fields. Respiratory effort normal. Cardiovascular system: S1 & S2 heard, RRR. No JVD, murmurs, rubs, gallops or clicks. No pedal edema. Gastrointestinal system: Abdomen is nondistended, soft and nontender. No organomegaly or masses felt. Normal bowel sounds heard. Central nervous system: Minimally responsive.  Left facial droop.   Extremities: Symmetric 5 x 5 power. Skin: No rashes, lesions or ulcers Psychiatry: Judgement and insight unable to assess due to mental status.    Data Reviewed: I have personally reviewed following labs and imaging studies  CBC: Recent Labs  Lab 06/02/23 1810  WBC 12.5*  HGB 14.0  HCT 43.4  MCV 85.4  PLT 231    Basic Metabolic Panel: Recent Labs  Lab 06/02/23 1810  NA 142  K 3.6  CL 103  CO2 25  GLUCOSE 108*  BUN 18  CREATININE 0.91  CALCIUM  9.6    GFR: CrCl cannot be calculated (Unknown ideal weight.).  Liver Function Tests: No results for input(s): AST, ALT, ALKPHOS, BILITOT, PROT, ALBUMIN in the last 168 hours.  CBG: No results for input(s): GLUCAP in the last 168 hours.   Recent Results (from the past 240 hours)  Resp panel by RT-PCR (RSV, Flu A&B, Covid) Anterior Nasal Swab     Status: None   Collection Time: 06/02/23  6:10 PM   Specimen: Anterior Nasal Swab  Result  Value Ref Range Status   SARS Coronavirus 2 by RT PCR NEGATIVE NEGATIVE Final   Influenza A by PCR NEGATIVE NEGATIVE Final   Influenza B by PCR NEGATIVE NEGATIVE Final    Comment: (NOTE) The Xpert Xpress SARS-CoV-2/FLU/RSV plus assay is intended as an aid in the diagnosis of influenza from Nasopharyngeal swab specimens and should not be used as a sole basis for treatment. Nasal washings and aspirates are unacceptable for Xpert Xpress SARS-CoV-2/FLU/RSV testing.  Fact Sheet for Patients: bloggercourse.com  Fact Sheet for Healthcare Providers: seriousbroker.it  This test is not yet approved or cleared by the [place]  FDA and has been authorized for detection and/or diagnosis of SARS-CoV-2 by FDA under an Emergency Use Authorization (EUA). This EUA will remain in effect (meaning this test can be used) for the duration of the COVID-19 declaration under Section 564(b)(1) of the Act, 21 U.S.C. section 360bbb-3(b)(1), unless the authorization is terminated or revoked.     Resp Syncytial Virus by PCR NEGATIVE NEGATIVE Final    Comment: (NOTE) Fact Sheet for Patients: bloggercourse.com  Fact Sheet for Healthcare Providers: seriousbroker.it  This test is not yet approved or cleared by the [place]  FDA and has been authorized for detection and/or diagnosis of SARS-CoV-2 by FDA under an Emergency Use Authorization (EUA). This EUA will remain in effect (meaning  this test can be used) for the duration of the COVID-19 declaration under Section 564(b)(1) of the Act, 21 U.S.C. section 360bbb-3(b)(1), unless the authorization is terminated or revoked.  Performed at [facility], [address], [place], [place] [address]   Blood culture (routine x 2)     Status: None (Preliminary result)   Collection Time: 06/02/23  6:10 PM   Specimen: BLOOD LEFT ARM  Result Value Ref Range  Status   Specimen Description BLOOD LEFT ARM  Final   Special Requests   Final    BOTTLES DRAWN AEROBIC AND ANAEROBIC Blood Culture adequate volume   Culture   Final    NO GROWTH < 12 HOURS Performed at [facility], [address], [place], [place] [address]    Report Status PENDING  Incomplete  Blood culture (routine x 2)     Status: None (Preliminary result)   Collection Time: 06/02/23  8:45 PM   Specimen: BLOOD RIGHT WRIST  Result Value Ref Range Status   Specimen Description BLOOD RIGHT WRIST  Final   Special Requests   Final    BOTTLES DRAWN AEROBIC AND ANAEROBIC Blood Culture results may not be optimal due to an inadequate volume of blood received in culture bottles   Culture   Final    NO GROWTH < 12 HOURS Performed at [facility], [address] [place], [place] [address]    Report Status PENDING  Incomplete         Radiology Studies: CT Angio Chest PE W and/or Wo Contrast Result Date: 06/02/2023 CLINICAL DATA:  Altered mental status and sepsis, high probability of pulmonary embolism. EXAM: CT ANGIOGRAPHY CHEST CT ABDOMEN AND PELVIS WITH CONTRAST TECHNIQUE: Multidetector CT imaging of the chest was performed using the standard protocol during bolus administration of intravenous contrast. Multiplanar CT image reconstructions and MIPs were obtained to evaluate the vascular anatomy. Multidetector CT imaging of the abdomen and pelvis was performed using the standard protocol during bolus administration of intravenous contrast. RADIATION DOSE REDUCTION: This exam was performed according to the departmental dose-optimization program which includes automated exposure control, adjustment of the mA and/or kV according to patient size and/or use of iterative reconstruction technique. CONTRAST:  75mL OMNIPAQUE  IOHEXOL  350 MG/ML SOLN COMPARISON:  Portable chest today, AP Lat chest 04/10/2023, CT abdomen and pelvis with contrast 12/10/2021, and the report of a CTA chest  02/28/2008, images for which are no longer available in PACS. FINDINGS: CTA CHEST FINDINGS Cardiovascular: The pulmonary arteries are normal in caliber. No arterial embolus is seen through the segmental divisions. The subsegmental arterial bed is largely not evaluated due to obscuration by respiratory motion. The cardiac size is normal. There is no pericardial effusion. There trace single-vessel calcifications in the LAD coronary artery. There is mild aortic atherosclerosis and tortuosity. Aortic opacification just insufficient to assess for dissection. Pulmonary veins are nondistended. Mediastinum/Nodes: No enlarged mediastinal, hilar, or axillary lymph nodes. Thyroid  gland and esophagus demonstrate no significant findings. There are retained secretions in the proximal trachea. Both main bronchi are clear. Lungs/Pleura: Segmental and subsegmental mucous plugging noted in the posterior basal lower lobes, mild subsegmental atelectasis in the posterior lower lobes. There are trace pleural effusions. No consolidation or nodules are seen through the breathing motion. Musculoskeletal: No chest wall mass is seen. 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 Mild thoracic kyphosis. No acute or significant osseous findings. Review of the MIP images confirms the above findings. CT  ABDOMEN and PELVIS FINDINGS Hepatobiliary: Stable 5 mm too small to characterize hypodensity in the dome of segment 8, probably a small cyst. The liver parenchyma is otherwise unremarkable. Gallbladder and bile ducts are unremarkable. There is suspicion of a small nonocclusive thrombus in the proximal right hepatic vein on 4: 12-14 and 9:78. Rest of the hepatic veins and the portal venous structures opacify normally. Pancreas: There are occasional pancreatic parenchymal calcifications consistent with chronic calcific pancreatitis. No acute pancreatic inflammatory changes or mass enhancement are seen, no ductal  dilatation. Spleen: No abnormality. Adrenals/Urinary Tract: There is no adrenal mass. Right kidney is unremarkable. There is a stable 1 cm Bosniak 1 cyst in the superior pole of the left kidney medially, Hounsfield density is 9. There is a stable 7 mm too small to characterize Bosniak 2 cyst in the outer midpole left kidney. There is no mass enhancement. There is no urinary stone or obstruction. Both kidneys excrete symmetrically on the delayed images. There is impression on the bladder by the enlarged prostate. There is mild bladder thickening versus underdistention or hypertrophy. Correlate with urinalysis for possible significance. Stomach/Bowel: Unremarkable stomach and unopacified small bowel. Normal caliber appendix. Scattered sigmoid diverticula without evidence of diverticulitis. Large stool accumulation distal sigmoid colon and rectum is again noted and slight perirectal fat stranding which could be from dependent congestive changes or stercoral proctitis. There is no overt wall thickening or pneumatosis. Vascular/Lymphatic: As above there is suspicion for a small nonocclusive thrombus in the right hepatic vein. There is mild aortic atherosclerosis without AAA or dissection. No other significant vascular findings. No lymphadenopathy is seen. Reproductive: Moderate to severe prostatomegaly, transverse axis 6.2 cm, purposes 6.4 cm, with bladder impression. Other: Small umbilical and left inguinal fat hernias. No incarcerated hernia. There is trace presacral ascites. There is no free hemorrhage, free air, or abscess. Musculoskeletal: There is disc collapse and interbody and facet joint ankylosis at L5-S1, multilevel lumbar bridging osteophytes. Advanced facet hypertrophy L3-4 and L4-5, with acquired spinal stenosis L4-5. There is ankylosis across the right SI joint. Mild hip DJD. Review of the MIP images confirms the above findings. IMPRESSION: 1. No evidence of pulmonary arterial embolus through the segmental  divisions. The subsegmental arterial bed is largely not evaluated due to obscuration by respiratory motion. 2. Segmental and subsegmental mucous plugging in the posterior basal lower lobes with mild subsegmental atelectasis. Trace pleural effusions. 3. Aortic and coronary artery atherosclerosis. 4. Suspicion of a small nonocclusive thrombus in the proximal right hepatic vein. 5. Large stool accumulation in the distal sigmoid colon and rectum with slight perirectal fat stranding, which could be from dependent congestive changes or stercoral proctitis. 6. Diverticulosis without evidence of diverticulitis. 7. Moderate to severe prostatomegaly with bladder impression. 8. Mild bladder thickening versus underdistention or hypertrophy. Correlate with urinalysis for possible significance. 9. Chronic calcific pancreatitis. No evidence of acute pancreatitis. 10. Umbilical and left inguinal fat hernias. Degenerative and DISH changes. Aortic Atherosclerosis (ICD10-I70.0). Electronically Signed   By: Francis Quam M.D.   On: 06/02/2023 21:22   CT ABDOMEN PELVIS W CONTRAST Result Date: 06/02/2023 CLINICAL DATA:  Altered mental status and sepsis, high probability of pulmonary embolism. EXAM: CT ANGIOGRAPHY CHEST CT ABDOMEN AND PELVIS WITH CONTRAST TECHNIQUE: Multidetector CT imaging of the chest was performed using the standard protocol during bolus administration of intravenous contrast. Multiplanar CT image reconstructions and MIPs were obtained to evaluate the vascular anatomy. Multidetector CT imaging of the abdomen and pelvis was performed using the standard protocol  during bolus administration of intravenous contrast. RADIATION DOSE REDUCTION: This exam was performed according to the departmental dose-optimization program which includes automated exposure control, adjustment of the mA and/or kV according to patient size and/or use of iterative reconstruction technique. CONTRAST:  75mL OMNIPAQUE  IOHEXOL  350 MG/ML SOLN  COMPARISON:  Portable chest today, AP Lat chest 04/10/2023, CT abdomen and pelvis with contrast 12/10/2021, and the report of a CTA chest 02/28/2008, images for which are no longer available in PACS. FINDINGS: CTA CHEST FINDINGS Cardiovascular: The pulmonary arteries are normal in caliber. No arterial embolus is seen through the segmental divisions. The subsegmental arterial bed is largely not evaluated due to obscuration by respiratory motion. The cardiac size is normal. There is no pericardial effusion. There trace single-vessel calcifications in the LAD coronary artery. There is mild aortic atherosclerosis and tortuosity. Aortic opacification just insufficient to assess for dissection. Pulmonary veins are nondistended. Mediastinum/Nodes: No enlarged mediastinal, hilar, or axillary lymph nodes. Thyroid  gland and esophagus demonstrate no significant findings. There are retained secretions in the proximal trachea. Both main bronchi are clear. Lungs/Pleura: Segmental and subsegmental mucous plugging noted in the posterior basal lower lobes, mild subsegmental atelectasis in the posterior lower lobes. There are trace pleural effusions. No consolidation or nodules are seen through the breathing motion. Musculoskeletal: No chest wall mass is seen. 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 Mild thoracic kyphosis. No acute or significant osseous findings. Review of the MIP images confirms the above findings. CT ABDOMEN and PELVIS FINDINGS Hepatobiliary: Stable 5 mm too small to characterize hypodensity in the dome of segment 8, probably a small cyst. The liver parenchyma is otherwise unremarkable. Gallbladder and bile ducts are unremarkable. There is suspicion of a small nonocclusive thrombus in the proximal right hepatic vein on 4: 12-14 and 9:78. Rest of the hepatic veins and the portal venous structures opacify normally. Pancreas: There are occasional pancreatic parenchymal  calcifications consistent with chronic calcific pancreatitis. No acute pancreatic inflammatory changes or mass enhancement are seen, no ductal dilatation. Spleen: No abnormality. Adrenals/Urinary Tract: There is no adrenal mass. Right kidney is unremarkable. There is a stable 1 cm Bosniak 1 cyst in the superior pole of the left kidney medially, Hounsfield density is 9. There is a stable 7 mm too small to characterize Bosniak 2 cyst in the outer midpole left kidney. There is no mass enhancement. There is no urinary stone or obstruction. Both kidneys excrete symmetrically on the delayed images. There is impression on the bladder by the enlarged prostate. There is mild bladder thickening versus underdistention or hypertrophy. Correlate with urinalysis for possible significance. Stomach/Bowel: Unremarkable stomach and unopacified small bowel. Normal caliber appendix. Scattered sigmoid diverticula without evidence of diverticulitis. Large stool accumulation distal sigmoid colon and rectum is again noted and slight perirectal fat stranding which could be from dependent congestive changes or stercoral proctitis. There is no overt wall thickening or pneumatosis. Vascular/Lymphatic: As above there is suspicion for a small nonocclusive thrombus in the right hepatic vein. There is mild aortic atherosclerosis without AAA or dissection. No other significant vascular findings. No lymphadenopathy is seen. Reproductive: Moderate to severe prostatomegaly, transverse axis 6.2 cm, purposes 6.4 cm, with bladder impression. Other: Small umbilical and left inguinal fat hernias. No incarcerated hernia. There is trace presacral ascites. There is no free hemorrhage, free air, or abscess. Musculoskeletal: There is disc collapse and interbody and facet joint ankylosis at L5-S1, multilevel lumbar bridging osteophytes. Advanced facet hypertrophy L3-4 and L4-5, with acquired spinal  stenosis L4-5. There is ankylosis across the right SI joint.  Mild hip DJD. Review of the MIP images confirms the above findings. IMPRESSION: 1. No evidence of pulmonary arterial embolus through the segmental divisions. The subsegmental arterial bed is largely not evaluated due to obscuration by respiratory motion. 2. Segmental and subsegmental mucous plugging in the posterior basal lower lobes with mild subsegmental atelectasis. Trace pleural effusions. 3. Aortic and coronary artery atherosclerosis. 4. Suspicion of a small nonocclusive thrombus in the proximal right hepatic vein. 5. Large stool accumulation in the distal sigmoid colon and rectum with slight perirectal fat stranding, which could be from dependent congestive changes or stercoral proctitis. 6. Diverticulosis without evidence of diverticulitis. 7. Moderate to severe prostatomegaly with bladder impression. 8. Mild bladder thickening versus underdistention or hypertrophy. Correlate with urinalysis for possible significance. 9. Chronic calcific pancreatitis. No evidence of acute pancreatitis. 10. Umbilical and left inguinal fat hernias. Degenerative and DISH changes. Aortic Atherosclerosis (ICD10-I70.0). Electronically Signed   By: Francis Quam M.D.   On: 06/02/2023 21:22   CT ANGIO HEAD NECK W WO CM Result Date: 06/02/2023 CLINICAL DATA:  Initial evaluation for acute stroke. EXAM: CT ANGIOGRAPHY HEAD AND NECK WITH AND WITHOUT CONTRAST TECHNIQUE: Multidetector CT imaging of the head and neck was performed using the standard protocol during bolus administration of intravenous contrast. Multiplanar CT image reconstructions and MIPs were obtained to evaluate the vascular anatomy. Carotid stenosis measurements (when applicable) are obtained utilizing NASCET criteria, using the distal internal carotid diameter as the denominator. RADIATION DOSE REDUCTION: This exam was performed according to the departmental dose-optimization program which includes automated exposure control, adjustment of the mA and/or kV according  to patient size and/or use of iterative reconstruction technique. CONTRAST:  75mL OMNIPAQUE  IOHEXOL  350 MG/ML SOLN COMPARISON:  Prior CT from earlier the same day. FINDINGS: CTA NECK FINDINGS Aortic arch: Visualized aortic arch within normal limits for caliber. Bovine branching pattern noted. Mild aortic atherosclerosis. No significant stenosis about the origin of the great vessels. Right carotid system: Right common and internal carotid arteries are patent without dissection. Mild atheromatous change about the right carotid bulb without hemodynamically significant greater than 50% stenosis. Left carotid system: Left CCA patent from its origin to the bifurcation. 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 Left ICA remains occluded to the skull base. Vertebral arteries: Both vertebral arteries arise from subclavian arteries. No proximal subclavian artery stenosis. Right vertebral artery slightly dominant. Vertebral arteries are patent without stenosis or dissection. Skeleton: No discrete or worrisome osseous lesions. Moderate spondylosis at C3-4 through C5-6. Other neck: No other acute finding. Upper chest: Layering secretions noted within the subglottic airway, suggesting at this patient is at risk for aspiration. No other acute finding. Visualized lungs are clear. Review of the MIP images confirms the above findings CTA HEAD FINDINGS Anterior circulation: The right ICA patent to the terminus without significant stenosis or other abnormality. Left ICA remains occluded to the terminus. A1 segments patent, with filling of the left A1 via collateral flow across the circle-of-Willis. Normal anterior communicating artery complex. Both ACAs patent without significant stenosis. Right M1 segment and its distal branches are patent and well perfused. Collateral filling of the left M1 segment the of the circle-of-Willis and possibly the left PCOM. Probable subocclusive thrombus  within the left M1 segment (series 7, images 91, 92). Some perfusion seen within the left MCA branches distally. Posterior circulation: Both V4 segments patent without significant stenosis.  Left PICA patent. Right PICA not well seen. Basilar diminutive but patent without stenosis. Superior cerebral arteries patent bilaterally. Predominant fetal type origin of the PCAs bilaterally, with partial occlusion of the proximal left PCOM (series 6, image 341). Left PCA patent distally and perfused to its distal aspect. Right PCA widely patent without stenosis. Venous sinuses: Patent allowing for timing the contrast bolus. Anatomic variants: As above.  No visible aneurysm. Review of the MIP images confirms the above findings IMPRESSION: 1. 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 Attenuated perfusion of the left MCA via collateral flow across the circle-of-Willis. Probable subocclusive thrombus within the left M1 segment. 2. 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 Left PCA patent and perfused distally. 3. Mild atheromatous change about the right carotid bulb without hemodynamically significant stenosis. 4. Layering secretions within the subglottic airway, suggesting that this patient is at risk for aspiration. Aortic Atherosclerosis (ICD10-I70.0). These results were communicated to Dr. Arora at 8:18 pm on 06/02/2023 by text page via the [facility] messaging system. Electronically Signed   By: Morene Hoard M.D.   On: 06/02/2023 21:14   CT Head Wo Contrast Result Date: 06/02/2023 CLINICAL DATA:  Initial evaluation for acute mental status change, unknown cause. EXAM: CT HEAD WITHOUT CONTRAST TECHNIQUE: Contiguous axial images were obtained from the base of the skull through the vertex without intravenous contrast. RADIATION DOSE REDUCTION: This exam was performed according to  the departmental dose-optimization program which includes automated exposure control, adjustment of the mA and/or kV according to patient size and/or use of iterative reconstruction technique. COMPARISON:  CT from 05/12/2023 FINDINGS: Brain: Large area of evolving cytotoxic edema seen involving the left cerebral hemisphere, essentially involving the entirety of the left MCA distribution, are consistent with a large evolving left MCA territory infarct, subacute in appearance. Associated regional mass effect with effacement of the left lateral ventricle. Associated 11-12 mm of right-to-left shift. Mild asymmetric dilatation of the right lateral ventricle without overt trapping or transependymal flow of CSF. Basilar cisterns remain patent. Acute extra-axial hemorrhage overlying the left cerebral convexity measures up to 7 mm in thickness (series 2, image 17). No acute intraparenchymal hemorrhage. No mass lesion. Underlying atrophy with chronic small vessel ischemic disease noted. 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 No other Vascular: Hyperdense left M1 segment, likely occluded. Skull: Scalp soft tissues demonstrate no acute finding. Calvarium intact. Sinuses/Orbits: Globes and orbital soft tissues within normal limits. Paranasal sinuses are clear. No mastoid effusion. Other: None. IMPRESSION: 1. 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 Associated regional mass effect with 11-12 mm of right-to-left shift. 2. Acute extra-axial hemorrhage overlying the left cerebral convexity measuring up to 7 mm in thickness. 3. Hyperdense left M1 segment, concerning for occlusion. Correlation with dedicated CTA recommended. 4. Underlying atrophy with chronic small vessel ischemic disease, with chronic infarcts about the right basal ganglia and right thalamus. Critical results were communicated to Dr. Arora at 8:15 pm on 06/02/2023 by text page via the [facility] messaging system.  Electronically Signed   By: Morene Hoard M.D.   On: 06/02/2023 20:18   DG Chest Portable 1 View Result Date: 06/02/2023 CLINICAL DATA:  Sepsis EXAM: PORTABLE CHEST 1 VIEW COMPARISON:  Chest x-ray 04/10/2023 FINDINGS: The heart size and mediastinal contours are within normal limits. Both lungs are clear. The visualized  skeletal structures are unremarkable. IMPRESSION: No active disease. Electronically Signed   By: Greig Pique M.D.   On: 06/02/2023 19:18        Scheduled Meds:   stroke: early stages of recovery book   Does not apply Once    morphine  injection  1 mg Intravenous Q8H   sodium chloride  flush  3 mL Intravenous Q12H   Continuous Infusions:  sodium chloride      levETIRAcetam  Stopped (06/03/23 0901)     LOS: 1 day    Time spent: 40 minutes    Toribio Hummer, MD [facility]   To contact the attending provider between 7A-7P or the covering provider during after hours 7P-7A, please log into the web site [email or web address] and access using universal [place] password for that web site. If you do not have the password, please call the hospital operator.  06/03/2023, 3:42 PM

## 2023-06-03 NOTE — Progress Notes (Signed)
   06/02/23 2320  Spiritual Encounters  Type of Visit Initial  Care provided to: [facility] partners present during encounter Nurse  Referral source Nurse (RN/NT/LPN)  Reason for visit Urgent spiritual support  OnCall Visit No   Chaplain responded to a call for support to the family of the patient, Jeff Wilson. I spoke with a son and daughter in the patient's room. They both asked for time alone with their father. I checked on the family members that were in the consult room they are supporting one another and waiting for additional siblings.   As I stepped away I advised the nurse to call if the family requests chaplain support.   Carley Birmingham [facility]  [phone]

## 2023-06-03 NOTE — Consult Note (Addendum)
 Palliative Medicine Inpatient Consult Note  Consulting Provider:  Doutova, Anastassia, MD   Reason for consult:   Palliative Care Consult Services Palliative Medicine Consult  Reason for Consult? Goals of care   06/03/2023  HPI:  Per intake H&P -->   Jeff Wilson is a 83 y.o. male with medical history significant of Dementia, CVA, TIA, seizure activity. Palliative care has been asked to get involved to support additional goals of care conversations in the setting of a large stroke.   Clinical Assessment/Goals of Care:  *Please note that this is a verbal dictation therefore any spelling or grammatical errors are due to the Dragon Medical One system interpretation.  I have reviewed medical records including EPIC notes, labs and imaging, received report from bedside RN, assessed the patient who is lying in bed noted to have slight grimacing.    I called patients niece, Jeff to further discuss diagnosis prognosis, GOC, EOL wishes, disposition and options.   I introduced Palliative Medicine as specialized medical care for people living with serious illness. It focuses on providing relief from the symptoms and stress of a serious illness. The goal is to improve quality of life for both the patient and the family.  Medical History Review and Understanding:  A review of Jeff Wilson' past medical history inclusive of dementia, CVA, seizures, and debility was complete.   Social History:  Jeff Wilson is from, [place]. He is separated form his wife. He has children, a son and a daughter though they are not actively involved in his life. He has a sister, Jeff Wilson and niece, Jeff who are involved in his day to day activities. He is retired. He is a man of the Christian faith.  Discussion:  Jeff Wilson has been living at [facility] for a number of years. He has a history of dementia. He was admitted to the hospital in the setting of a large stroke.   I spoke to patients niece, Jeff and she shares that  the ER medicine team had explained well to she and her mother the severity of this stroke. Reviewed the plan from here which is to optimize patient comfort.   We talked about transition to comfort measures in house and what that would entail inclusive of medications to control pain, dyspnea, agitation, nausea, itching, and hiccups.  We discussed stopping all uneccessary measures such as cardiac monitoring, blood draws, needle sticks, and frequent vital signs.   We also discussed the option of transition to an inpatient hospice home. Patients niece would be interested in [facility].  ________________ Addendum:  I spoke with patients son, Jeff Wilson this early evening. He is in agreement with the above. He shares that he and his sister make decisions on their fathers behalf.   Contact numbers have been updated.  Continue comfort measures and planned [facility].   Add time: 45  Decision Maker: Jeff Wilson (niece) [phone]  SUMMARY OF RECOMMENDATIONS   DNAR/DNI  Comfort measures  Have added some low dose morphine  Q8H ATC for facial grimacing and generalized pain  Additional comfort medications per MAR  TOC - [facility]   Ongoing PMT support  Code Status/Advance Care Planning: DNAR/DNI  Palliative Prophylaxis:  Aspiration, Bowel Regimen, Delirium Protocol, Frequent Pain Assessment, Oral Care, Palliative Wound Care, and Turn Reposition  Additional Recommendations (Limitations, Scope, Preferences): Continue present care - comfort care  Psycho-social/Spiritual:  Desire for further Chaplaincy support: Yes Additional Recommendations: Education offered on  Prognosis: Limited overall.   Discharge Planning: Discharge to hospice home if accepted  Vitals:   06/03/23 0900 06/03/23 1207  BP: 111/80 115/77  Pulse: 95 94  Resp: (!) 22 (!) 23  Temp:  99 F (37.2 C)  SpO2: 96%     Intake/Output Summary (Last 24 hours) at  06/03/2023 1308 Last data filed at 06/02/2023 2308 Gross per 24 hour  Intake --  Output 100 ml  Net -100 ml   Gen:  Elderly AA M in moderate distress  HEENT: Dry mucous membranes CV: Regular rate and rhythm  PULM:  On 3LPM [place], breathing is even and nonlabored ABD: soft/nontender  EXT: No edema  Neuro:  Somnolent, (+) grimace  PPS: 30%   This conversation/these recommendations were discussed with patient primary care team, Dr. Sebastian  Billing based on MDM: High ______________________________________________________ Rosaline Becton [facility] Palliative Medicine Team Team Cell Phone: [phone] Please utilize secure chat with additional questions, if there is no response within 30 minutes please call the above phone number  Palliative Medicine Team providers are available by phone from 7am to 7pm daily and can be reached through the team cell phone.  Should this patient require assistance outside of these hours, please call the patient's attending physician.

## 2023-06-03 NOTE — Progress Notes (Signed)
CSW spoke with Kae Heller of [facility] to make referral for evaluation.  Edwin Dada, MSW, LCSW Transitions of Care  Clinical Social Worker II [phone]

## 2023-06-04 DIAGNOSIS — Z8669 Personal history of other diseases of the nervous system and sense organs: Secondary | ICD-10-CM | POA: Diagnosis not present

## 2023-06-04 DIAGNOSIS — Z515 Encounter for palliative care: Secondary | ICD-10-CM | POA: Diagnosis not present

## 2023-06-04 DIAGNOSIS — I639 Cerebral infarction, unspecified: Secondary | ICD-10-CM | POA: Diagnosis not present

## 2023-06-04 DIAGNOSIS — F0390 Unspecified dementia without behavioral disturbance: Secondary | ICD-10-CM | POA: Diagnosis not present

## 2023-06-04 DIAGNOSIS — I63512 Cerebral infarction due to unspecified occlusion or stenosis of left middle cerebral artery: Secondary | ICD-10-CM | POA: Diagnosis not present

## 2023-06-04 DIAGNOSIS — Z7189 Other specified counseling: Secondary | ICD-10-CM | POA: Diagnosis not present

## 2023-06-04 LAB — CULTURE, BLOOD (ROUTINE X 2): Special Requests: ADEQUATE

## 2023-06-04 MED ORDER — MORPHINE SULFATE (CONCENTRATE) 10 MG /0.5 ML PO SOLN: 5.0000 mg | Freq: Three times a day (TID) | ORAL | 0 refills | Status: AC | PRN

## 2023-06-04 MED ORDER — BISACODYL 10 MG RE SUPP: 10.0000 mg | Freq: Every day | RECTAL | Status: AC

## 2023-06-04 MED ORDER — ONDANSETRON 4 MG PO TBDP: 4.0000 mg | ORAL_TABLET | Freq: Four times a day (QID) | ORAL | 0 refills | Status: AC | PRN

## 2023-06-04 MED ORDER — POLYVINYL ALCOHOL 1.4 % OP SOLN: 1.0000 [drp] | Freq: Four times a day (QID) | OPHTHALMIC | Status: AC | PRN

## 2023-06-04 MED ORDER — GLYCOPYRROLATE 1 MG PO TABS: 1.0000 mg | ORAL_TABLET | ORAL | 0 refills | Status: AC | PRN

## 2023-06-04 NOTE — Plan of Care (Signed)
  Problem: Clinical Measurements: Goal: Quality of life will improve Outcome: Progressing   Problem: Pain Management: Goal: Satisfaction with pain management regimen will improve Outcome: Progressing   Problem: Ischemic Stroke/TIA Tissue Perfusion: Goal: Complications of ischemic stroke/TIA will be minimized Outcome: Progressing   Problem: Safety: Goal: Ability to remain free from injury will improve Outcome: Progressing

## 2023-06-04 NOTE — TOC Transition Note (Addendum)
 Transition of Care [facility]) - Discharge Note   Patient Details  Name: Jeff Wilson MRN: [id number] Date of Birth: [birth year]  [facility] CM/SW Contact:  Almarie CHRISTELLA Goodie, LCSW Phone Number: 06/04/2023, 3:48 PM   Clinical Narrative:   CSW updated by hospice liaison that patient is approved and bed is available today. Family in agreement, spouse completed consents. Transport arranged with [facility] for next available. CSW notified that patient will be transferred to [facility] to await [facility], PT[facility] given updated room number for pick-up.  Number for report is [phone].     Final next level of care: Hospice Medical Facility Barriers to Discharge: Barriers Resolved   Patient Goals and CMS Choice   CMS Medicare.gov Compare Post Acute Care list provided to:: Patient Represenative (must comment) Choice offered to / list presented to : Spouse [place] ownership interest in [facility].provided to:: Spouse    Discharge Placement              Patient chooses bed at:  [facility]) Patient to be transferred to facility by: [facility] Name of family member notified: Spouse Patient and family notified of of transfer: 06/04/23  Discharge Plan and Services Additional resources added to the After Visit Summary for                                       Social Drivers of Health (SDOH) Interventions SDOH Screenings   Food Insecurity: Patient Unable To Answer (06/03/2023)  Housing: Patient Unable To Answer (06/03/2023)  Recent Concern: Housing - High Risk (03/17/2023)  Transportation Needs: Patient Unable To Answer (06/03/2023)  Utilities: Patient Unable To Answer (06/03/2023)  Financial Resource Strain: Low Risk  (01/28/2019)   Received from [facility] visits prior to 06/29/2022., [facility] visits prior to 06/29/2022.  Social Connections: Patient Unable To Answer (06/03/2023)  Tobacco Use: Medium Risk (06/03/2023)      Readmission Risk Interventions    12/17/2021   12:10 PM  Readmission Risk Prevention Plan  Transportation Screening Complete  PCP or Specialist Appt within 5-7 Days Complete  Home Care Screening Complete  Medication Review (RN CM) Complete

## 2023-06-04 NOTE — Progress Notes (Signed)
 Patient transferred to [facility] and will dc from that unit to [facility]. 3 Ivs and foley was left in place per the hospice nurse request

## 2023-06-04 NOTE — Progress Notes (Addendum)
 Palliative Medicine Inpatient Follow Up Note  HPI:  Jeff Wilson is a 83 y.o. male with medical history significant of Dementia, CVA, TIA, seizure activity. Palliative care has been asked to get involved to support additional goals of care conversations in the setting of a large stroke.   Today's Discussion 06/04/2023  *Please note that this is a verbal dictation therefore any spelling or grammatical errors are due to the Dragon Medical One system interpretation.  Chart reviewed inclusive of vital signs, progress notes, laboratory results, and diagnostic images. Patient symptoms have been under good control with morphine  atc.   I met with Jeff Wilson at bedside this morning, he is resting comfortably and is noted to have apneic episodes of breathing.   I spoke with Jeff Wilson this afternoon. He shares with me that he understands his father will transition to hospice this afternoon.   _______________________ Addendum:  I received a called this afternoon that Jeff Wilson' wife who he is not legally separated from is still alive. She had not been provided an update on his clinical scenario.  I called and spoke at length with Jeff Wilson' wife Jeff Wilson. We talked about his present clinical illness in the setting of his acute stroke. We reviewed that he is presently on comfort measures, Patients spouse is aware of this as Jeff Wilson had updated her.  From a social perspective, Jeff Wilson shares she had some health issues she was attending to over the past few years and had asked Jeff Wilson to support Jeff Wilson during that time. Jeff Wilson expresses that Jeff Wilson' son and daughter who were from a separate marriage very infrequently see him and are often not involved with him in the setting of his declining condition(s) over the years.   We reviewed that legally, Jeff Wilson is his wife and his decision maker unless documentation is produced to indicate otherwise.   Jeff Wilson is in agreement with transfer to [facility].  I have  coordinated with the [facility] RN who will re-route consents to [place].   Questions and concerns addressed/Palliative Support Provided.   Add Time: 45  Objective Assessment: Vital Signs Vitals:   06/04/23 0413 06/04/23 0756  BP: 106/83 (!) 115/91  Pulse: (!) 103 100  Resp: 16 16  Temp: 98.5 F (36.9 C) 98.3 F (36.8 C)  SpO2: 94% 97%    Intake/Output Summary (Last 24 hours) at 06/04/2023 1227 Last data filed at 06/04/2023 0500 Gross per 24 hour  Intake 0 ml  Output --  Net 0 ml   Gen:  Elderly AA M in moderate distress  HEENT: Dry mucous membranes CV: Regular rate and rhythm  PULM:  On RA, , breathing is even and nonlabored ABD: soft/nontender  EXT: No edema  Neuro:  Somnolent, (+) grimace  SUMMARY OF RECOMMENDATIONS   DNAR/DNI   Comfort measures   Continue low dose morphine  Q8H ATC     Additional comfort medications per MAR   TOC - [facility]   Patients spouse, Jeff Wilson with whom he is still legally married is his primary management consultant   Ongoing PMT support  Billing based on MDM: High - in the setting of opoid review and titration ______________________________________________________________________________________ Jeff Wilson [place] Palliative Medicine Team Team Cell Phone: [phone] Please utilize secure chat with additional questions, if there is no response within 30 minutes please call the above phone number  Palliative Medicine Team providers are available by phone from 7am to 7pm daily and can be reached through the team cell phone.  Should this patient require assistance outside of these hours, please call the patient's attending physician.

## 2023-06-04 NOTE — Progress Notes (Signed)
 Pt lying in bed rise and fall of the chest noted. PRN morphine  given and pt position adjusted  for comfort.

## 2023-06-04 NOTE — Discharge Summary (Signed)
 Physician Discharge Summary  Jeff Wilson FMW:[id number] DOB: [birth year] DOA: 06/02/2023  PCP: Feliciano Devoria LABOR, MD  Admit date: 06/02/2023 Discharge date: 06/04/2023  Time spent: 55 minutes  Recommendations for Outpatient Follow-up:  Follow-up with MD at residential hospice home.   Discharge Diagnoses:  Principal Problem:   Stroke [facility]) Active Problems:   Stercoral colitis   Lactic acidosis   Essential hypertension   Mixed hyperlipidemia   Dementia without behavioral disturbance (HCC)   Acute ischemic stroke (HCC)   Elevated troponin   Hepatic vein thrombosis (HCC)   History of seizure disorder   Constipation   Discharge Condition: Stable  Diet recommendation: Cough feeds  There were no vitals filed for this visit.  History of present illness:  HPI per Dr. Silvester Lynwood Ulloa is a 83 y.o. male with medical history significant of Dementia, CVA, TIA, seizure activity     Presented with   encephalopathy Patient with history of dementia currently residing at nursing home facility Found to have new facial droop right hemiplegia with forced leftward gaze and became less responsive Today was found to be in A-fib with RVR heart rate 130s CT head showed large complicated left Hemi spheric infarct CT angio showed left ICA occlusion Neurology was consulted patient not a candidate for tPA given completed stroke Prior history of TIA in the past    Denies significant ETOH intake   Does not smoke     Recent Labs       Lab Results  Component Value Date    SARSCOV2NAA NEGATIVE 06/02/2023    SARSCOV2NAA NEGATIVE 02/19/2023    SARSCOV2NAA NEGATIVE 05/22/2020           Regarding pertinent Chronic problems:      Hyperlipidemia -  on statins Lipitor (atorvastatin )       HTN on norvasc        Hx of CVA - *with/out residual deficits on Aspirin  81 mg  Plavix    Seizure DO - currently on Keppra      While in ER:    Clinical Course as of 06/02/23 2230  Mon Jun 02, 2023   1737 AMS, left sided facial droop, history of CVA, takes thinners. Wasn't responding to verbal stimuli. CNA gave him whole bed bath and patient didn't even flinch so facility provider recommended observation, low concern because patient was found to in similar state on 1/13. Seizure last time. Compliant on seizure meds. LKW unsure. ` [CG]  2203 Discussed with Dr. Otelia who states nothing to do right now. Will follow along and consult PRN [CG]     Clinical Course User Index [CG] Ruthell Lonni FALCON, PA-C      Lab Orders         Resp panel by RT-PCR (RSV, Flu A&B, Covid) Anterior Nasal Swab         Blood culture (routine x 2)         CBC         Basic metabolic panel         Brain natriuretic peptide         Urinalysis, Routine w reflex microscopic -Urine, Clean Catch         Ammonia         Protime-INR         APTT         I-Stat CG4 Lactic Acid        CT HEAD Large evolving left MCA territory infarct, subacute in appearance. Associated regional mass effect with  11-12 mm of right-to-left shift. 2. Acute extra-axial hemorrhage overlying the left cerebral convexity measuring up to 7 mm in thickness. 3. Hyperdense left M1 segment, concerning for occlusion. Correlation with dedicated CTA recommended. 4. Underlying atrophy with chronic small vessel ischemic disease, with chronic infarcts about the right basal ganglia and right thalamus.   Associated regional mass effect with 11 to 12 mm of right-to-left shift.    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 Attenuated perfusion of the left MCA via collateral flow across the circle-of-Willis. Probable subocclusive thrombus within the left M1 segment.     Layering secretions within the subglottic airway, suggesting that this patient is at risk for aspiration.   CXR -  NON acute   CTabd/pelvis - small nonocclusive thrombus in the proximal right hepatic vein   CTA  chest -   no PE, Segmental and subsegmental mucous plugging in the posterior basal lower lobes with mild subsegmental atelectasis. Trace pleural effusions.   Large stool accumulation in the distal sigmoid colon and rectum with slight perirectal fat stranding, which could be from dependent congestive changes or stercoral proctitis.   Mild bladder thickening    . Chronic calcific pancreatitis.    Hospital Course:  #1 subacute large left MCA territory infarct/extra-axial hemorrhage overlying left cerebral convexity up to 7 mm in thickness -Noted on head CT on admission. -CT angiogram head and neck done concerning for acute left ICA occlusion. -Patient noted to be in A-fib with RVR on presentation and felt stroke likely cardioembolic in nature. -Patient seen in consultation by neurology noted that patient with a poor baseline prior to stroke and due to large stroke from a neurological recovery standpoint it was felt by neurology that her prognosis was going to be extremely poor. -Admitting physician discussed with family, also I discussed with family and decision made to transition to full comfort measures. -Palliative care consulted. -Patient kept comfortable and will be discharged to residential hospice home.   2.  Constipation/stercoral colitis -Noted on CT abdomen and pelvis.   -Patient placed on comfort measures on admission.   -Patient given glycerin  support and will be discharged on Dulcolax suppositories daily.     3.  Lactic acidosis -Patient was transition to full comfort measures on admission and as such we will hold off on any further evaluation.   4.  Hypertension -Patient currently under comfort measures and likely not a good candidate for aggressive blood pressure management.   5.  Hyperlipidemia -Patient minimally responsive. -Patient was transitioned to full comfort measures on admission.    6.  Elevated troponin -In the setting of large severe stroke. -Patient  minimally responsive. -Patient was transitioned to full comfort measures.   -No further workup indicated at this time.     7.  Hepatic vein thrombosis -Patient with a intracranial hemorrhage and as such not a candidate for anticoagulation. -Patient was placed on comfort measures.    8.  History of seizure disorder -Patient maintained on IV Keppra . -Patient was transition to comfort measures.   -Patient was discharged on oral Keppra  if able to tolerate oral intake.       Procedures: CT head 06/02/2023 CT angiogram head and neck 06/02/2023 CT angiogram chest 06/02/2023 CT abdomen and pelvis 06/02/2023 Chest x-ray 06/02/2023  Consultations: Neurology: Dr.Arora Palliative care  Discharge Exam: Vitals:   06/04/23 0413 06/04/23 0756  BP: 106/83 (!) 115/91  Pulse: (!) 103 100  Resp:  16 16  Temp: 98.5 F (36.9 C) 98.3 F (36.8 C)  SpO2: 94% 97%    General: Minimally responsive. Cardiovascular: RRR no murmurs rubs or gallops.  No JVD.  No lower extremity edema. Respiratory: CTAB anterior lung fields.  Discharge Instructions   Discharge Instructions     Diet general   Complete by: As directed    Comfort foods if alert.  Patient currently NPO.   Increase activity slowly   Complete by: As directed       Allergies as of 06/04/2023   No Known Allergies      Medication List     STOP taking these medications    amLODipine  5 MG tablet Commonly known as: NORVASC    atorvastatin  40 MG tablet Commonly known as: LIPITOR   calcium  carbonate 500 MG chewable tablet Commonly known as: TUMS - dosed in mg elemental calcium    clopidogrel  75 MG tablet Commonly known as: PLAVIX    multivitamin with minerals Tabs tablet   polyethylene glycol 17 g packet Commonly known as: MIRALAX  / GLYCOLAX    sennosides-docusate sodium  8.6-50 MG tablet Commonly known as: SENOKOT-S   Vitamin D (Ergocalciferol) 1.25 MG (50000 UNIT) Caps capsule Commonly known as: DRISDOL       TAKE these  medications    bisacodyl  10 MG suppository Commonly known as: Dulcolax Place 1 suppository (10 mg total) rectally daily.   glycopyrrolate  1 MG tablet Commonly known as: ROBINUL  Take 1 tablet (1 mg total) by mouth every 4 (four) hours as needed (excessive secretions).   levETIRAcetam  250 MG tablet Commonly known as: Keppra  Take 2 tablets (500 mg total) by mouth 2 (two) times daily.   morphine  CONCENTRATE 10 mg / 0.5 ml concentrated solution Take 0.25 mLs (5 mg total) by mouth every 8 (eight) hours as needed for severe pain (pain score 7-10).   ondansetron  4 MG disintegrating tablet Commonly known as: ZOFRAN -ODT Take 1 tablet (4 mg total) by mouth every 6 (six) hours as needed for nausea.   polyvinyl alcohol  1.4 % ophthalmic solution Commonly known as: LIQUIFILM TEARS Place 1 drop into both eyes 4 (four) times daily as needed for dry eyes.       No Known Allergies  Follow-up Information     MD AT [facility] Follow up.                   The results of significant diagnostics from this hospitalization (including imaging, microbiology, ancillary and laboratory) are listed below for reference.    Significant Diagnostic Studies: CT Angio Chest PE W and/or Wo Contrast Result Date: 06/02/2023 CLINICAL DATA:  Altered mental status and sepsis, high probability of pulmonary embolism. EXAM: CT ANGIOGRAPHY CHEST CT ABDOMEN AND PELVIS WITH CONTRAST TECHNIQUE: Multidetector CT imaging of the chest was performed using the standard protocol during bolus administration of intravenous contrast. Multiplanar CT image reconstructions and MIPs were obtained to evaluate the vascular anatomy. Multidetector CT imaging of the abdomen and pelvis was performed using the standard protocol during bolus administration of intravenous contrast. RADIATION DOSE REDUCTION: This exam was performed according to the departmental dose-optimization program which includes automated exposure control,  adjustment of the mA and/or kV according to patient size and/or use of iterative reconstruction technique. CONTRAST:  75mL OMNIPAQUE  IOHEXOL  350 MG/ML SOLN COMPARISON:  Portable chest today, AP Lat chest 04/10/2023, CT abdomen and pelvis with contrast 12/10/2021, and the report of a CTA chest 02/28/2008, images for which are no longer available in PACS. FINDINGS:  CTA CHEST FINDINGS Cardiovascular: The pulmonary arteries are normal in caliber. No arterial embolus is seen through the segmental divisions. The subsegmental arterial bed is largely not evaluated due to obscuration by respiratory motion. The cardiac size is normal. There is no pericardial effusion. There trace single-vessel calcifications in the LAD coronary artery. There is mild aortic atherosclerosis and tortuosity. Aortic opacification just insufficient to assess for dissection. Pulmonary veins are nondistended. Mediastinum/Nodes: No enlarged mediastinal, hilar, or axillary lymph nodes. Thyroid  gland and esophagus demonstrate no significant findings. There are retained secretions in the proximal trachea. Both main bronchi are clear. Lungs/Pleura: Segmental and subsegmental mucous plugging noted in the posterior basal lower lobes, mild subsegmental atelectasis in the posterior lower lobes. There are trace pleural effusions. No consolidation or nodules are seen through the breathing motion. Musculoskeletal: No chest wall mass is seen. 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 Mild thoracic kyphosis. No acute or significant osseous findings. Review of the MIP images confirms the above findings. CT ABDOMEN and PELVIS FINDINGS Hepatobiliary: Stable 5 mm too small to characterize hypodensity in the dome of segment 8, probably a small cyst. The liver parenchyma is otherwise unremarkable. Gallbladder and bile ducts are unremarkable. There is suspicion of a small nonocclusive thrombus in the proximal right hepatic vein on 4:  12-14 and 9:78. Rest of the hepatic veins and the portal venous structures opacify normally. Pancreas: There are occasional pancreatic parenchymal calcifications consistent with chronic calcific pancreatitis. No acute pancreatic inflammatory changes or mass enhancement are seen, no ductal dilatation. Spleen: No abnormality. Adrenals/Urinary Tract: There is no adrenal mass. Right kidney is unremarkable. There is a stable 1 cm Bosniak 1 cyst in the superior pole of the left kidney medially, Hounsfield density is 9. There is a stable 7 mm too small to characterize Bosniak 2 cyst in the outer midpole left kidney. There is no mass enhancement. There is no urinary stone or obstruction. Both kidneys excrete symmetrically on the delayed images. There is impression on the bladder by the enlarged prostate. There is mild bladder thickening versus underdistention or hypertrophy. Correlate with urinalysis for possible significance. Stomach/Bowel: Unremarkable stomach and unopacified small bowel. Normal caliber appendix. Scattered sigmoid diverticula without evidence of diverticulitis. Large stool accumulation distal sigmoid colon and rectum is again noted and slight perirectal fat stranding which could be from dependent congestive changes or stercoral proctitis. There is no overt wall thickening or pneumatosis. Vascular/Lymphatic: As above there is suspicion for a small nonocclusive thrombus in the right hepatic vein. There is mild aortic atherosclerosis without AAA or dissection. No other significant vascular findings. No lymphadenopathy is seen. Reproductive: Moderate to severe prostatomegaly, transverse axis 6.2 cm, purposes 6.4 cm, with bladder impression. Other: Small umbilical and left inguinal fat hernias. No incarcerated hernia. There is trace presacral ascites. There is no free hemorrhage, free air, or abscess. Musculoskeletal: There is disc collapse and interbody and facet joint ankylosis at L5-S1, multilevel lumbar  bridging osteophytes. Advanced facet hypertrophy L3-4 and L4-5, with acquired spinal stenosis L4-5. There is ankylosis across the right SI joint. Mild hip DJD. Review of the MIP images confirms the above findings. IMPRESSION: 1. No evidence of pulmonary arterial embolus through the segmental divisions. The subsegmental arterial bed is largely not evaluated due to obscuration by respiratory motion. 2. Segmental and subsegmental mucous plugging in the posterior basal lower lobes with mild subsegmental atelectasis. Trace pleural effusions. 3. Aortic and coronary artery atherosclerosis. 4. Suspicion of a small nonocclusive thrombus in the  proximal right hepatic vein. 5. Large stool accumulation in the distal sigmoid colon and rectum with slight perirectal fat stranding, which could be from dependent congestive changes or stercoral proctitis. 6. Diverticulosis without evidence of diverticulitis. 7. Moderate to severe prostatomegaly with bladder impression. 8. Mild bladder thickening versus underdistention or hypertrophy. Correlate with urinalysis for possible significance. 9. Chronic calcific pancreatitis. No evidence of acute pancreatitis. 10. Umbilical and left inguinal fat hernias. Degenerative and DISH changes. Aortic Atherosclerosis (ICD10-I70.0). Electronically Signed   By: Francis Quam M.D.   On: 06/02/2023 21:22   CT ABDOMEN PELVIS W CONTRAST Result Date: 06/02/2023 CLINICAL DATA:  Altered mental status and sepsis, high probability of pulmonary embolism. EXAM: CT ANGIOGRAPHY CHEST CT ABDOMEN AND PELVIS WITH CONTRAST TECHNIQUE: Multidetector CT imaging of the chest was performed using the standard protocol during bolus administration of intravenous contrast. Multiplanar CT image reconstructions and MIPs were obtained to evaluate the vascular anatomy. Multidetector CT imaging of the abdomen and pelvis was performed using the standard protocol during bolus administration of intravenous contrast. RADIATION DOSE  REDUCTION: This exam was performed according to the departmental dose-optimization program which includes automated exposure control, adjustment of the mA and/or kV according to patient size and/or use of iterative reconstruction technique. CONTRAST:  75mL OMNIPAQUE  IOHEXOL  350 MG/ML SOLN COMPARISON:  Portable chest today, AP Lat chest 04/10/2023, CT abdomen and pelvis with contrast 12/10/2021, and the report of a CTA chest 02/28/2008, images for which are no longer available in PACS. FINDINGS: CTA CHEST FINDINGS Cardiovascular: The pulmonary arteries are normal in caliber. No arterial embolus is seen through the segmental divisions. The subsegmental arterial bed is largely not evaluated due to obscuration by respiratory motion. The cardiac size is normal. There is no pericardial effusion. There trace single-vessel calcifications in the LAD coronary artery. There is mild aortic atherosclerosis and tortuosity. Aortic opacification just insufficient to assess for dissection. Pulmonary veins are nondistended. Mediastinum/Nodes: No enlarged mediastinal, hilar, or axillary lymph nodes. Thyroid  gland and esophagus demonstrate no significant findings. There are retained secretions in the proximal trachea. Both main bronchi are clear. Lungs/Pleura: Segmental and subsegmental mucous plugging noted in the posterior basal lower lobes, mild subsegmental atelectasis in the posterior lower lobes. There are trace pleural effusions. No consolidation or nodules are seen through the breathing motion. Musculoskeletal: No chest wall mass is seen. 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 Mild thoracic kyphosis. No acute or significant osseous findings. Review of the MIP images confirms the above findings. CT ABDOMEN and PELVIS FINDINGS Hepatobiliary: Stable 5 mm too small to characterize hypodensity in the dome of segment 8, probably a small cyst. The liver parenchyma is otherwise unremarkable.  Gallbladder and bile ducts are unremarkable. There is suspicion of a small nonocclusive thrombus in the proximal right hepatic vein on 4: 12-14 and 9:78. Rest of the hepatic veins and the portal venous structures opacify normally. Pancreas: There are occasional pancreatic parenchymal calcifications consistent with chronic calcific pancreatitis. No acute pancreatic inflammatory changes or mass enhancement are seen, no ductal dilatation. Spleen: No abnormality. Adrenals/Urinary Tract: There is no adrenal mass. Right kidney is unremarkable. There is a stable 1 cm Bosniak 1 cyst in the superior pole of the left kidney medially, Hounsfield density is 9. There is a stable 7 mm too small to characterize Bosniak 2 cyst in the outer midpole left kidney. There is no mass enhancement. There is no urinary stone or obstruction. Both kidneys excrete symmetrically on the delayed images. There is  impression on the bladder by the enlarged prostate. There is mild bladder thickening versus underdistention or hypertrophy. Correlate with urinalysis for possible significance. Stomach/Bowel: Unremarkable stomach and unopacified small bowel. Normal caliber appendix. Scattered sigmoid diverticula without evidence of diverticulitis. Large stool accumulation distal sigmoid colon and rectum is again noted and slight perirectal fat stranding which could be from dependent congestive changes or stercoral proctitis. There is no overt wall thickening or pneumatosis. Vascular/Lymphatic: As above there is suspicion for a small nonocclusive thrombus in the right hepatic vein. There is mild aortic atherosclerosis without AAA or dissection. No other significant vascular findings. No lymphadenopathy is seen. Reproductive: Moderate to severe prostatomegaly, transverse axis 6.2 cm, purposes 6.4 cm, with bladder impression. Other: Small umbilical and left inguinal fat hernias. No incarcerated hernia. There is trace presacral ascites. There is no free  hemorrhage, free air, or abscess. Musculoskeletal: There is disc collapse and interbody and facet joint ankylosis at L5-S1, multilevel lumbar bridging osteophytes. Advanced facet hypertrophy L3-4 and L4-5, with acquired spinal stenosis L4-5. There is ankylosis across the right SI joint. Mild hip DJD. Review of the MIP images confirms the above findings. IMPRESSION: 1. No evidence of pulmonary arterial embolus through the segmental divisions. The subsegmental arterial bed is largely not evaluated due to obscuration by respiratory motion. 2. Segmental and subsegmental mucous plugging in the posterior basal lower lobes with mild subsegmental atelectasis. Trace pleural effusions. 3. Aortic and coronary artery atherosclerosis. 4. Suspicion of a small nonocclusive thrombus in the proximal right hepatic vein. 5. Large stool accumulation in the distal sigmoid colon and rectum with slight perirectal fat stranding, which could be from dependent congestive changes or stercoral proctitis. 6. Diverticulosis without evidence of diverticulitis. 7. Moderate to severe prostatomegaly with bladder impression. 8. Mild bladder thickening versus underdistention or hypertrophy. Correlate with urinalysis for possible significance. 9. Chronic calcific pancreatitis. No evidence of acute pancreatitis. 10. Umbilical and left inguinal fat hernias. Degenerative and DISH changes. Aortic Atherosclerosis (ICD10-I70.0). Electronically Signed   By: Francis Quam M.D.   On: 06/02/2023 21:22   CT ANGIO HEAD NECK W WO CM Result Date: 06/02/2023 CLINICAL DATA:  Initial evaluation for acute stroke. EXAM: CT ANGIOGRAPHY HEAD AND NECK WITH AND WITHOUT CONTRAST TECHNIQUE: Multidetector CT imaging of the head and neck was performed using the standard protocol during bolus administration of intravenous contrast. Multiplanar CT image reconstructions and MIPs were obtained to evaluate the vascular anatomy. Carotid stenosis measurements (when applicable) are  obtained utilizing NASCET criteria, using the distal internal carotid diameter as the denominator. RADIATION DOSE REDUCTION: This exam was performed according to the departmental dose-optimization program which includes automated exposure control, adjustment of the mA and/or kV according to patient size and/or use of iterative reconstruction technique. CONTRAST:  75mL OMNIPAQUE  IOHEXOL  350 MG/ML SOLN COMPARISON:  Prior CT from earlier the same day. FINDINGS: CTA NECK FINDINGS Aortic arch: Visualized aortic arch within normal limits for caliber. Bovine branching pattern noted. Mild aortic atherosclerosis. No significant stenosis about the origin of the great vessels. Right carotid system: Right common and internal carotid arteries are patent without dissection. Mild atheromatous change about the right carotid bulb without hemodynamically significant greater than 50% stenosis. Left carotid system: Left CCA patent from its origin to the bifurcation. 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 Left ICA remains occluded to the skull base. Vertebral arteries: Both vertebral arteries arise from subclavian arteries. No proximal subclavian artery stenosis. Right vertebral artery slightly  dominant. Vertebral arteries are patent without stenosis or dissection. Skeleton: No discrete or worrisome osseous lesions. Moderate spondylosis at C3-4 through C5-6. Other neck: No other acute finding. Upper chest: Layering secretions noted within the subglottic airway, suggesting at this patient is at risk for aspiration. No other acute finding. Visualized lungs are clear. Review of the MIP images confirms the above findings CTA HEAD FINDINGS Anterior circulation: The right ICA patent to the terminus without significant stenosis or other abnormality. Left ICA remains occluded to the terminus. A1 segments patent, with filling of the left A1 via collateral flow across the circle-of-Willis.  Normal anterior communicating artery complex. Both ACAs patent without significant stenosis. Right M1 segment and its distal branches are patent and well perfused. Collateral filling of the left M1 segment the of the circle-of-Willis and possibly the left PCOM. Probable subocclusive thrombus within the left M1 segment (series 7, images 91, 92). Some perfusion seen within the left MCA branches distally. Posterior circulation: Both V4 segments patent without significant stenosis. Left PICA patent. Right PICA not well seen. Basilar diminutive but patent without stenosis. Superior cerebral arteries patent bilaterally. Predominant fetal type origin of the PCAs bilaterally, with partial occlusion of the proximal left PCOM (series 6, image 341). Left PCA patent distally and perfused to its distal aspect. Right PCA widely patent without stenosis. Venous sinuses: Patent allowing for timing the contrast bolus. Anatomic variants: As above.  No visible aneurysm. Review of the MIP images confirms the above findings IMPRESSION: 1. 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 Attenuated perfusion of the left MCA via collateral flow across the circle-of-Willis. Probable subocclusive thrombus within the left M1 segment. 2. 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 Left PCA patent and perfused distally. 3. Mild atheromatous change about the right carotid bulb without hemodynamically significant stenosis. 4. Layering secretions within the subglottic airway, suggesting that this patient is at risk for aspiration. Aortic Atherosclerosis (ICD10-I70.0). These results were communicated to Dr. Arora at 8:18 pm on 06/02/2023 by text page via the [facility] messaging system. Electronically Signed   By: Morene Hoard M.D.   On: 06/02/2023 21:14   CT Head Wo Contrast Result Date: 06/02/2023 CLINICAL  DATA:  Initial evaluation for acute mental status change, unknown cause. EXAM: CT HEAD WITHOUT CONTRAST TECHNIQUE: Contiguous axial images were obtained from the base of the skull through the vertex without intravenous contrast. RADIATION DOSE REDUCTION: This exam was performed according to the departmental dose-optimization program which includes automated exposure control, adjustment of the mA and/or kV according to patient size and/or use of iterative reconstruction technique. COMPARISON:  CT from 05/12/2023 FINDINGS: Brain: Large area of evolving cytotoxic edema seen involving the left cerebral hemisphere, essentially involving the entirety of the left MCA distribution, are consistent with a large evolving left MCA territory infarct, subacute in appearance. Associated regional mass effect with effacement of the left lateral ventricle. Associated 11-12 mm of right-to-left shift. Mild asymmetric dilatation of the right lateral ventricle without overt trapping or transependymal flow of CSF. Basilar cisterns remain patent. Acute extra-axial hemorrhage overlying the left cerebral convexity measures up to 7 mm in thickness (series 2, image 17). No acute intraparenchymal hemorrhage. No mass lesion. Underlying atrophy with chronic small vessel ischemic disease noted. 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 No other Vascular: Hyperdense left M1 segment, likely occluded.  Skull: Scalp soft tissues demonstrate no acute finding. Calvarium intact. Sinuses/Orbits: Globes and orbital soft tissues within normal limits. Paranasal sinuses are clear. No mastoid effusion. Other: None. IMPRESSION: 1. 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 Associated regional mass effect with 11-12 mm of right-to-left shift. 2. Acute extra-axial hemorrhage overlying the left cerebral convexity measuring up to 7 mm in thickness. 3. Hyperdense left M1 segment, concerning for occlusion. Correlation  with dedicated CTA recommended. 4. Underlying atrophy with chronic small vessel ischemic disease, with chronic infarcts about the right basal ganglia and right thalamus. Critical results were communicated to Dr. Arora at 8:15 pm on 06/02/2023 by text page via the [facility] messaging system. Electronically Signed   By: Morene Hoard M.D.   On: 06/02/2023 20:18   DG Chest Portable 1 View Result Date: 06/02/2023 CLINICAL DATA:  Sepsis EXAM: PORTABLE CHEST 1 VIEW COMPARISON:  Chest x-ray 04/10/2023 FINDINGS: The heart size and mediastinal contours are within normal limits. Both lungs are clear. The visualized skeletal structures are unremarkable. IMPRESSION: No active disease. Electronically Signed   By: Greig Pique M.D.   On: 06/02/2023 19:18   EEG adult Result Date: 05/12/2023 Shelton Arlin KIDD, MD     05/12/2023  3:13 PM Patient Name: Kamaury Cutbirth MRN: [id number] Epilepsy Attending: Arlin KIDD Shelton Referring Physician/Provider: Michaela Aisha SQUIBB, MD Date: 05/12/2023 Duration: 22.53 mins Patient history: 83yo M with ams getting eeg to evaluate for seizure Level of alertness: Awake AEDs during EEG study: Technical aspects: This EEG study was done with scalp electrodes positioned according to the 10-20 International system of electrode placement. Electrical activity was reviewed with band pass filter of 1-70Hz , sensitivity of 7 uV/mm, display speed of 54mm/sec with a 60Hz  notched filter applied as appropriate. EEG data were recorded continuously and digitally stored.  Video monitoring was available and reviewed as appropriate. Description: The posterior dominant rhythm consists of 8 Hz activity of moderate voltage (25-35 uV) seen predominantly in posterior head regions, symmetric and reactive to eye opening and eye closing. Hyperventilation and photic stimulation were not performed.   IMPRESSION: This study is within normal limits. No seizures or epileptiform discharges were seen throughout the recording. A  normal interictal EEG does not exclude the diagnosis of epilepsy. Priyanka O Yadav   CT Head Wo Contrast Result Date: 05/12/2023 CLINICAL DATA:  Seizure, new-onset, no history of trauma EXAM: CT HEAD WITHOUT CONTRAST TECHNIQUE: Contiguous axial images were obtained from the base of the skull through the vertex without intravenous contrast. RADIATION DOSE REDUCTION: This exam was performed according to the departmental dose-optimization program which includes automated exposure control, adjustment of the mA and/or kV according to patient size and/or use of iterative reconstruction technique. COMPARISON:  None Available. FINDINGS: Brain: No evidence of acute infarction, hemorrhage, hydrocephalus, extra-axial collection or mass lesion/mass effect. Patchy white matter hypodensities are nonspecific but compatible with chronic microvascular ischemic change. 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 Vascular: No hyperdense vessel or unexpected calcification. Skull: No acute fracture. Sinuses/Orbits: Clear sinuses.  No acute orbital findings. Other: No mastoid effusions. IMPRESSION: 1. No evidence of acute intracranial abnormality. 2. 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 Electronically Signed   By: Gilmore GORMAN Molt M.D.   On: 05/12/2023 13:01    Microbiology: Recent Results (from the past 240 hours)  Resp panel by RT-PCR (RSV, Flu A&B, Covid) Anterior Nasal Swab     Status: None   Collection Time: 06/02/23  6:10 PM   Specimen:  Anterior Nasal Swab  Result Value Ref Range Status   SARS Coronavirus 2 by RT PCR NEGATIVE NEGATIVE Final   Influenza A by PCR NEGATIVE NEGATIVE Final   Influenza B by PCR NEGATIVE NEGATIVE Final    Comment: (NOTE) The Xpert Xpress SARS-CoV-2/FLU/RSV plus assay is intended as an aid in the diagnosis of influenza from Nasopharyngeal swab specimens and should not be used as a sole basis for treatment. Nasal washings and aspirates are  unacceptable for Xpert Xpress SARS-CoV-2/FLU/RSV testing.  Fact Sheet for Patients: bloggercourse.com  Fact Sheet for Healthcare Providers: seriousbroker.it  This test is not yet approved or cleared by the [place]  FDA and has been authorized for detection and/or diagnosis of SARS-CoV-2 by FDA under an Emergency Use Authorization (EUA). This EUA will remain in effect (meaning this test can be used) for the duration of the COVID-19 declaration under Section 564(b)(1) of the Act, 21 U.S.C. section 360bbb-3(b)(1), unless the authorization is terminated or revoked.     Resp Syncytial Virus by PCR NEGATIVE NEGATIVE Final    Comment: (NOTE) Fact Sheet for Patients: bloggercourse.com  Fact Sheet for Healthcare Providers: seriousbroker.it  This test is not yet approved or cleared by the [place]  FDA and has been authorized for detection and/or diagnosis of SARS-CoV-2 by FDA under an Emergency Use Authorization (EUA). This EUA will remain in effect (meaning this test can be used) for the duration of the COVID-19 declaration under Section 564(b)(1) of the Act, 21 U.S.C. section 360bbb-3(b)(1), unless the authorization is terminated or revoked.  Performed at [facility], [address], [place], [place] [address]   Blood culture (routine x 2)     Status: Abnormal   Collection Time: 06/02/23  6:10 PM   Specimen: BLOOD LEFT ARM  Result Value Ref Range Status   Specimen Description BLOOD LEFT ARM  Final   Special Requests   Final    BOTTLES DRAWN AEROBIC AND ANAEROBIC Blood Culture adequate volume   Culture  Setup Time   Final    GRAM POSITIVE COCCI IN CLUSTERS AEROBIC BOTTLE ONLY CRITICAL RESULT CALLED TO, READ BACK BY AND VERIFIED WITH: PHARMD TWEE  DANG ON 06/03/23 @ 1948 BY DRT    Culture (A)  Final    STAPHYLOCOCCUS CAPITIS THE SIGNIFICANCE OF ISOLATING THIS  ORGANISM FROM A SINGLE SET OF BLOOD CULTURES WHEN MULTIPLE SETS ARE DRAWN IS UNCERTAIN. PLEASE NOTIFY THE MICROBIOLOGY DEPARTMENT WITHIN ONE WEEK IF SPECIATION AND SENSITIVITIES ARE REQUIRED. Performed at [facility], [address], [place], [place] [address]    Report Status 06/04/2023 FINAL  Final  Blood Culture ID Panel (Reflexed)     Status: Abnormal   Collection Time: 06/02/23  6:10 PM  Result Value Ref Range Status   Enterococcus faecalis NOT DETECTED NOT DETECTED Final   Enterococcus Faecium NOT DETECTED NOT DETECTED Final   Listeria monocytogenes NOT DETECTED NOT DETECTED Final   Staphylococcus species DETECTED (A) NOT DETECTED Final    Comment: CRITICAL RESULT CALLED TO, READ BACK BY AND VERIFIED WITH: PHARMD TWEE  DANG ON 06/03/23 @ 1948 BY DRT    Staphylococcus aureus (BCID) NOT DETECTED NOT DETECTED Final   Staphylococcus epidermidis NOT DETECTED NOT DETECTED Final   Staphylococcus lugdunensis NOT DETECTED NOT DETECTED Final   Streptococcus species NOT DETECTED NOT DETECTED Final   Streptococcus agalactiae NOT DETECTED NOT DETECTED Final   Streptococcus pneumoniae NOT DETECTED NOT DETECTED Final   Streptococcus pyogenes NOT DETECTED NOT DETECTED Final  A.calcoaceticus-baumannii NOT DETECTED NOT DETECTED Final   Bacteroides fragilis NOT DETECTED NOT DETECTED Final   Enterobacterales NOT DETECTED NOT DETECTED Final   Enterobacter cloacae complex NOT DETECTED NOT DETECTED Final   Escherichia coli NOT DETECTED NOT DETECTED Final   Klebsiella aerogenes NOT DETECTED NOT DETECTED Final   Klebsiella oxytoca NOT DETECTED NOT DETECTED Final   Klebsiella pneumoniae NOT DETECTED NOT DETECTED Final   Proteus species NOT DETECTED NOT DETECTED Final   Salmonella species NOT DETECTED NOT DETECTED Final   Serratia marcescens NOT DETECTED NOT DETECTED Final   Haemophilus influenzae NOT DETECTED NOT DETECTED Final   Neisseria meningitidis NOT DETECTED NOT DETECTED Final    Pseudomonas aeruginosa NOT DETECTED NOT DETECTED Final   Stenotrophomonas maltophilia NOT DETECTED NOT DETECTED Final   Candida albicans NOT DETECTED NOT DETECTED Final   Candida auris NOT DETECTED NOT DETECTED Final   Candida glabrata NOT DETECTED NOT DETECTED Final   Candida krusei NOT DETECTED NOT DETECTED Final   Candida parapsilosis NOT DETECTED NOT DETECTED Final   Candida tropicalis NOT DETECTED NOT DETECTED Final   Cryptococcus neoformans/gattii NOT DETECTED NOT DETECTED Final    Comment: Performed at [facility], [address], [place], [place] [address]  Blood culture (routine x 2)     Status: None (Preliminary result)   Collection Time: 06/02/23  8:45 PM   Specimen: BLOOD RIGHT WRIST  Result Value Ref Range Status   Specimen Description BLOOD RIGHT WRIST  Final   Special Requests   Final    BOTTLES DRAWN AEROBIC AND ANAEROBIC Blood Culture results may not be optimal due to an inadequate volume of blood received in culture bottles   Culture   Final    NO GROWTH 2 DAYS Performed at [facility], [address] [place], [place] [address]    Report Status PENDING  Incomplete     Labs: Basic Metabolic Panel: Recent Labs  Lab 06/02/23 1810  NA 142  K 3.6  CL 103  CO2 25  GLUCOSE 108*  BUN 18  CREATININE 0.91  CALCIUM  9.6   Liver Function Tests: No results for input(s): AST, ALT, ALKPHOS, BILITOT, PROT, ALBUMIN in the last 168 hours. No results for input(s): LIPASE, AMYLASE in the last 168 hours. Recent Labs  Lab 06/02/23 2045  AMMONIA 27   CBC: Recent Labs  Lab 06/02/23 1810  WBC 12.5*  HGB 14.0  HCT 43.4  MCV 85.4  PLT 231   Cardiac Enzymes: No results for input(s): CKTOTAL, CKMB, CKMBINDEX, TROPONINI in the last 168 hours. BNP: BNP (last 3 results) Recent Labs    06/02/23 1810  BNP 84.2    ProBNP (last 3 results) No results for input(s): PROBNP in the last 8760 hours.  CBG: No results for input(s):  GLUCAP in the last 168 hours.     Signed:  Toribio Hummer MD.  [facility] 06/04/2023, 12:53 PM

## 2023-06-05 ENCOUNTER — Encounter: Payer: Self-pay | Admitting: *Deleted

## 2023-06-06 LAB — CULTURE, BLOOD (ROUTINE X 2)

## 2023-06-09 ENCOUNTER — Ambulatory Visit: Payer: Medicare HMO | Admitting: Diagnostic Neuroimaging
